# Patient Record
Sex: Male | Born: 1965 | Race: White | Hispanic: No | Marital: Married | Smoking: Never smoker
Health system: Southern US, Community
[De-identification: ages and names within clinical notes are randomized; demographics above are authoritative.]

## PROBLEM LIST (undated history)

## (undated) DIAGNOSIS — M1990 Unspecified osteoarthritis, unspecified site: Secondary | ICD-10-CM

## (undated) DIAGNOSIS — E785 Hyperlipidemia, unspecified: Secondary | ICD-10-CM

## (undated) DIAGNOSIS — G4730 Sleep apnea, unspecified: Secondary | ICD-10-CM

## (undated) DIAGNOSIS — T7840XA Allergy, unspecified, initial encounter: Secondary | ICD-10-CM

## (undated) DIAGNOSIS — F419 Anxiety disorder, unspecified: Secondary | ICD-10-CM

## (undated) HISTORY — PX: WISDOM TOOTH EXTRACTION: SHX21

## (undated) HISTORY — DX: Sleep apnea, unspecified: G47.30

## (undated) HISTORY — DX: Allergy, unspecified, initial encounter: T78.40XA

## (undated) HISTORY — PX: OPEN REDUCTION SHOULDER DISLOCATION: SUR900

## (undated) HISTORY — DX: Hyperlipidemia, unspecified: E78.5

## (undated) HISTORY — DX: Anxiety disorder, unspecified: F41.9

## (undated) HISTORY — DX: Unspecified osteoarthritis, unspecified site: M19.90

---

## 2012-05-26 ENCOUNTER — Institutional Professional Consult (permissible substitution): Payer: Self-pay | Admitting: Pulmonary Disease

## 2013-03-27 ENCOUNTER — Ambulatory Visit
Admission: RE | Admit: 2013-03-27 | Discharge: 2013-03-27 | Disposition: A | Discharge status: Home / Self Care | Payer: BC Managed Care – PPO | Source: Ambulatory Visit | Attending: Unknown Physician Specialty | Admitting: Unknown Physician Specialty

## 2013-03-27 ENCOUNTER — Other Ambulatory Visit: Payer: Self-pay | Admitting: Unknown Physician Specialty

## 2013-03-27 DIAGNOSIS — M545 Low back pain, unspecified: Secondary | ICD-10-CM

## 2014-10-08 LAB — BASIC METABOLIC PANEL
BUN: 19 (ref 4–21)
Creatinine: 1 (ref <=1.3)
GLUCOSE: 98
SODIUM: 137 (ref 137–147)

## 2014-10-08 LAB — HEPATIC FUNCTION PANEL: BILIRUBIN, TOTAL: 0.6

## 2015-03-16 IMAGING — CR DG LUMBAR SPINE COMPLETE 4+V
5 series · 5 of 5 positions shown · non-contrast
Comparison: None.

CLINICAL DATA: Lower back pain

LUMBAR SPINE - COMPLETE 4+ VIEW

[view not recorded (1 of 5)]
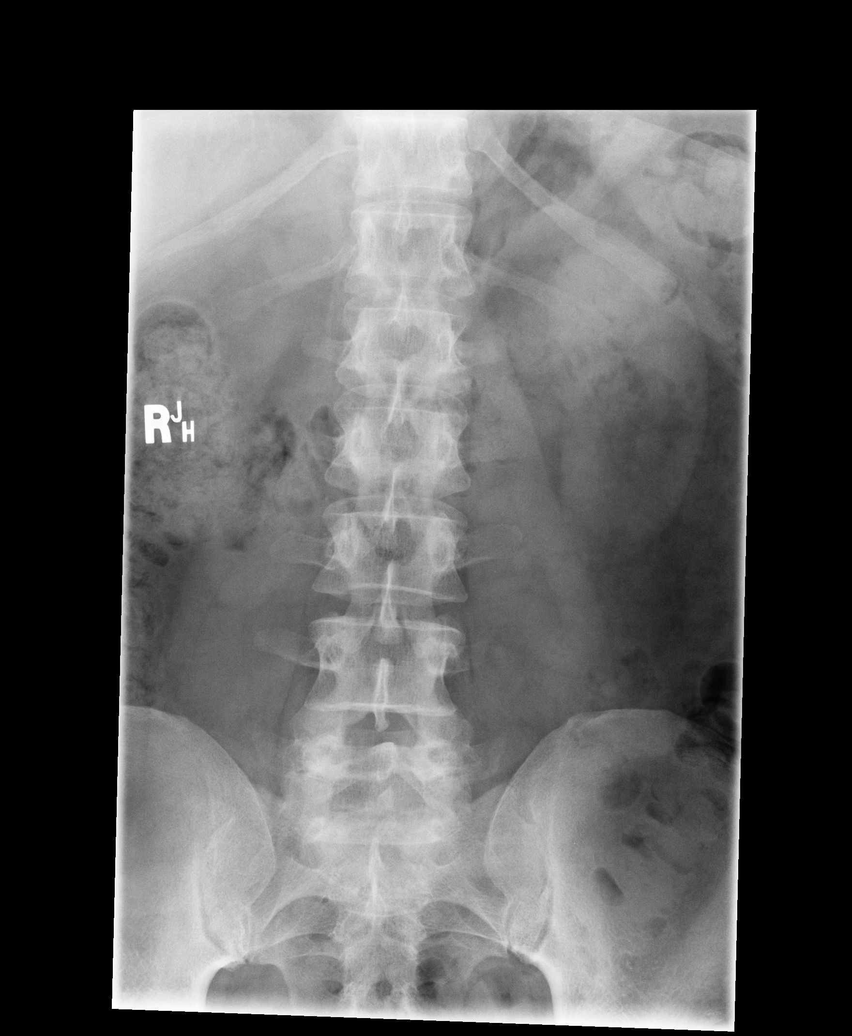

[view not recorded (2 of 5)]
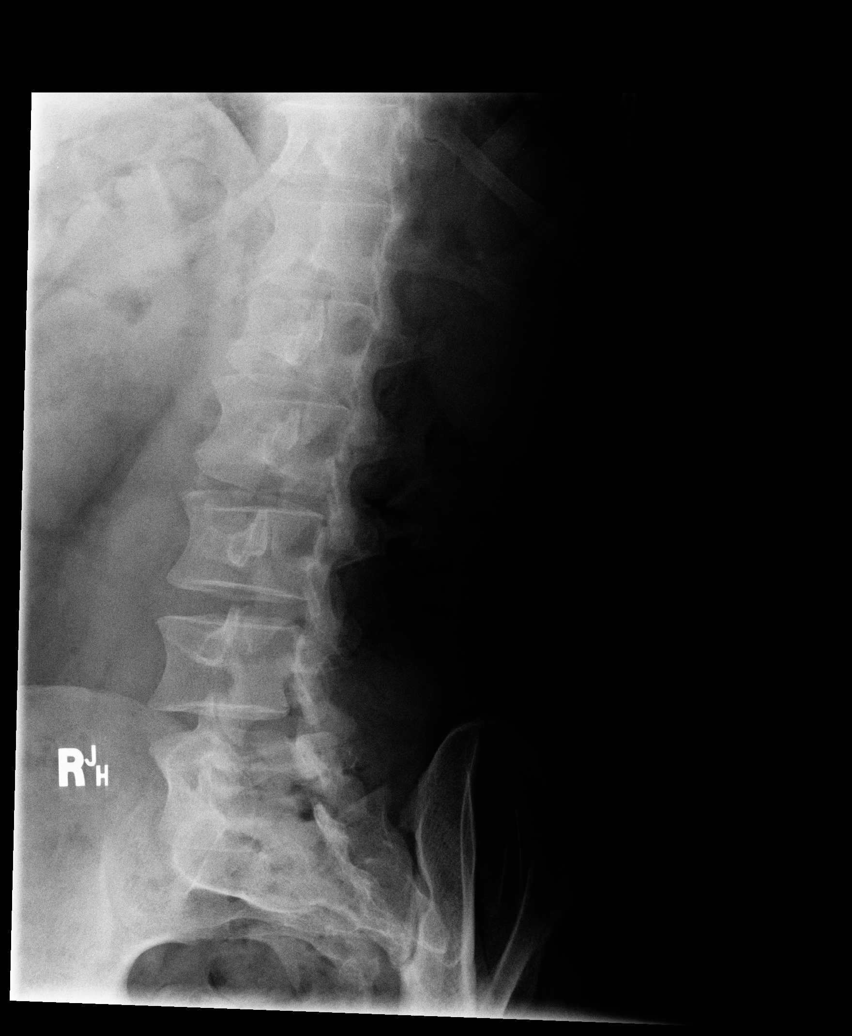

[view not recorded (3 of 5)]
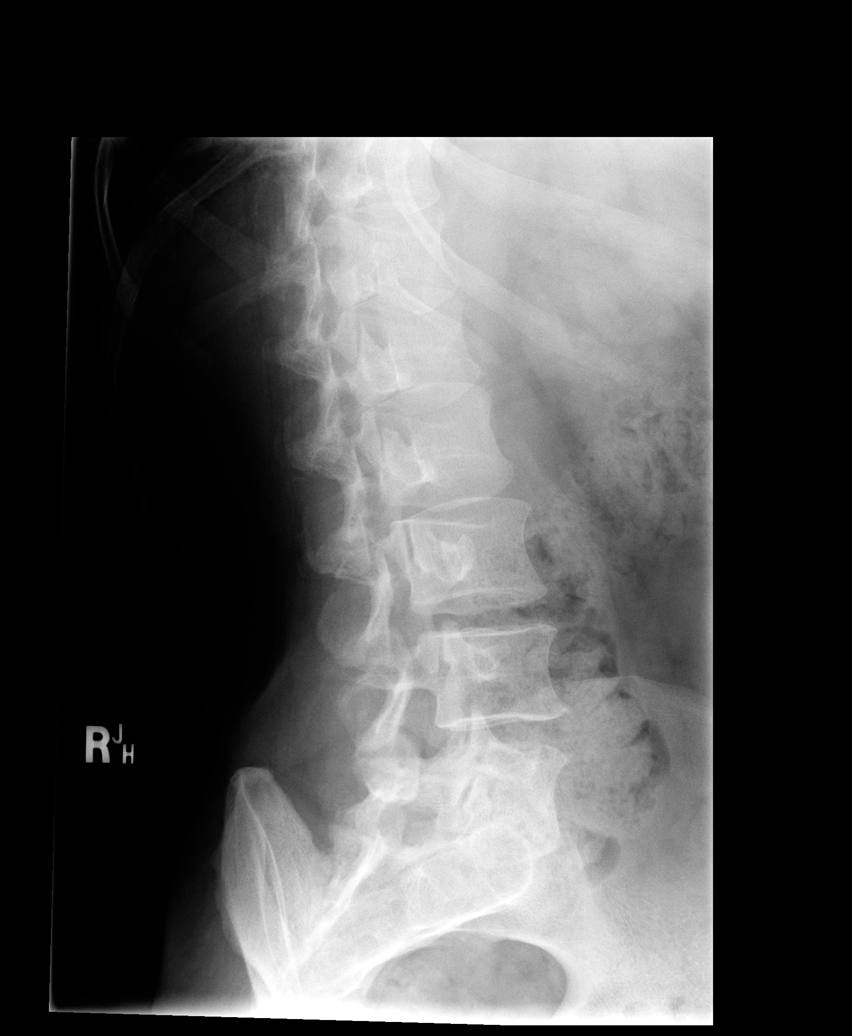

[view not recorded (4 of 5)]
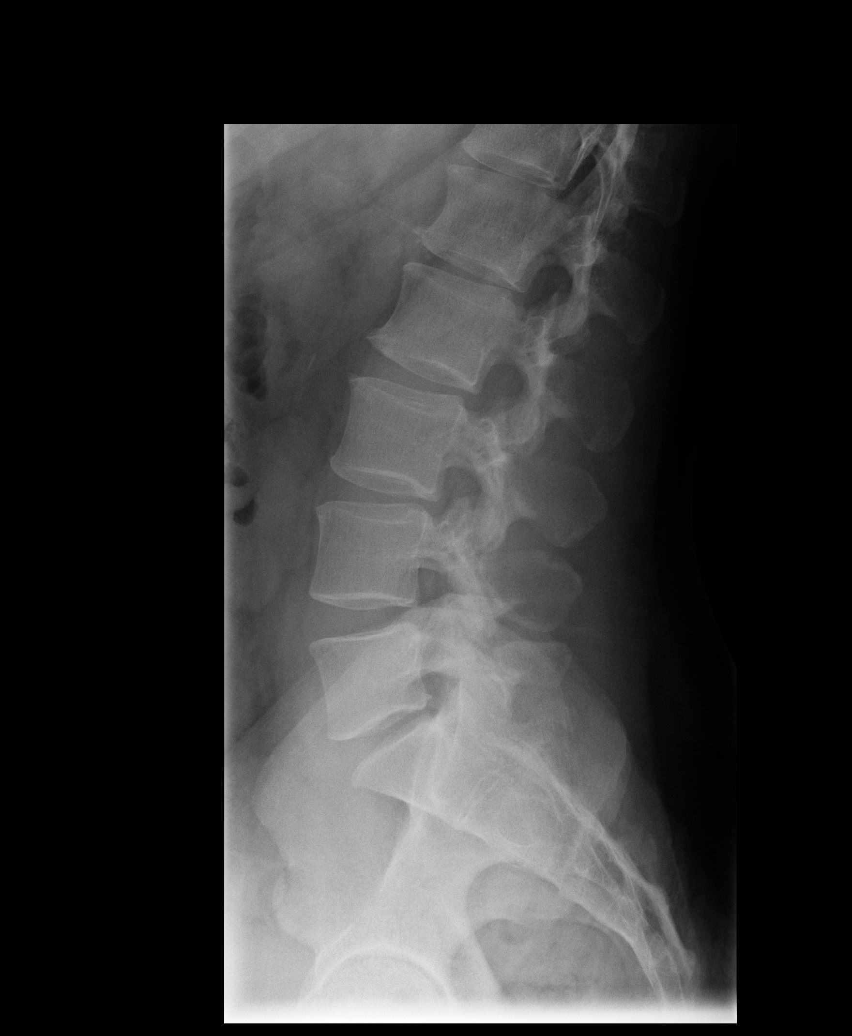

[view not recorded (5 of 5)]
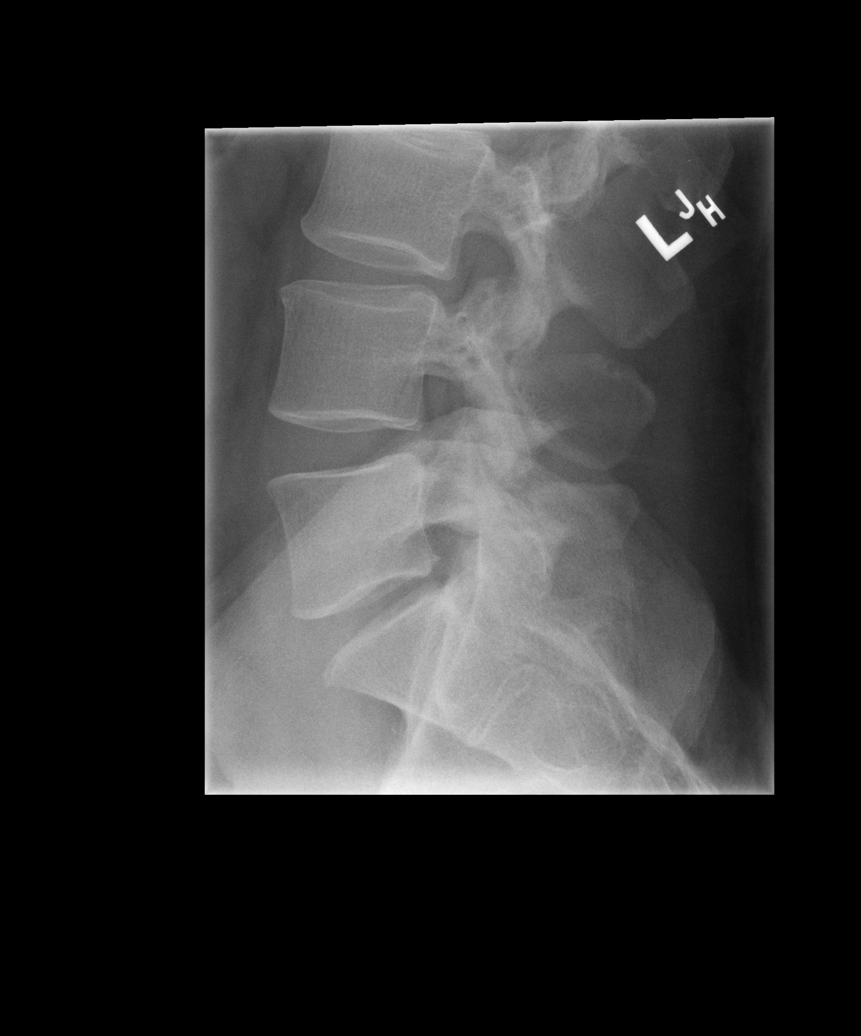

[5 of 5 positions shown; findings below may reference images not displayed]

FINDINGS: No fracture or spondylolisthesis is noted.  No
significant degenerative changes are noted.  Disc spaces are well
maintained.  Posterior facet joints appear normal.
IMPRESSION: Normal lumbar spine.

## 2016-02-02 DIAGNOSIS — G4733 Obstructive sleep apnea (adult) (pediatric): Secondary | ICD-10-CM | POA: Diagnosis not present

## 2016-05-04 DIAGNOSIS — G4733 Obstructive sleep apnea (adult) (pediatric): Secondary | ICD-10-CM | POA: Diagnosis not present

## 2016-05-08 DIAGNOSIS — N451 Epididymitis: Secondary | ICD-10-CM | POA: Diagnosis not present

## 2016-05-23 DIAGNOSIS — M545 Low back pain: Secondary | ICD-10-CM | POA: Diagnosis not present

## 2016-06-08 DIAGNOSIS — Z6828 Body mass index (BMI) 28.0-28.9, adult: Secondary | ICD-10-CM | POA: Diagnosis not present

## 2016-06-08 DIAGNOSIS — Z0000 Encounter for general adult medical examination without abnormal findings: Secondary | ICD-10-CM | POA: Diagnosis not present

## 2016-06-08 DIAGNOSIS — Z23 Encounter for immunization: Secondary | ICD-10-CM | POA: Diagnosis not present

## 2016-06-08 LAB — BASIC METABOLIC PANEL
BUN: 17 (ref 4–21)
CREATININE: 1.1 (ref 0.6–1.3)
Glucose: 105
Sodium: 140 (ref 137–147)

## 2016-06-08 LAB — HEPATIC FUNCTION PANEL: Bilirubin, Total: 0.7

## 2016-06-08 LAB — PSA: PSA: 1.9

## 2016-08-09 DIAGNOSIS — G4733 Obstructive sleep apnea (adult) (pediatric): Secondary | ICD-10-CM | POA: Diagnosis not present

## 2016-10-22 DIAGNOSIS — H1045 Other chronic allergic conjunctivitis: Secondary | ICD-10-CM | POA: Diagnosis not present

## 2016-10-22 DIAGNOSIS — J309 Allergic rhinitis, unspecified: Secondary | ICD-10-CM | POA: Diagnosis not present

## 2016-11-20 DIAGNOSIS — F331 Major depressive disorder, recurrent, moderate: Secondary | ICD-10-CM | POA: Diagnosis not present

## 2016-12-04 DIAGNOSIS — L700 Acne vulgaris: Secondary | ICD-10-CM | POA: Diagnosis not present

## 2016-12-10 DIAGNOSIS — G4733 Obstructive sleep apnea (adult) (pediatric): Secondary | ICD-10-CM | POA: Diagnosis not present

## 2016-12-12 DIAGNOSIS — F339 Major depressive disorder, recurrent, unspecified: Secondary | ICD-10-CM | POA: Diagnosis not present

## 2016-12-12 DIAGNOSIS — F4010 Social phobia, unspecified: Secondary | ICD-10-CM | POA: Diagnosis not present

## 2016-12-12 DIAGNOSIS — F410 Panic disorder [episodic paroxysmal anxiety] without agoraphobia: Secondary | ICD-10-CM | POA: Diagnosis not present

## 2016-12-12 DIAGNOSIS — F411 Generalized anxiety disorder: Secondary | ICD-10-CM | POA: Diagnosis not present

## 2017-01-03 DIAGNOSIS — F339 Major depressive disorder, recurrent, unspecified: Secondary | ICD-10-CM | POA: Diagnosis not present

## 2017-01-03 DIAGNOSIS — F4010 Social phobia, unspecified: Secondary | ICD-10-CM | POA: Diagnosis not present

## 2017-01-03 DIAGNOSIS — F411 Generalized anxiety disorder: Secondary | ICD-10-CM | POA: Diagnosis not present

## 2017-01-03 DIAGNOSIS — F410 Panic disorder [episodic paroxysmal anxiety] without agoraphobia: Secondary | ICD-10-CM | POA: Diagnosis not present

## 2017-01-08 DIAGNOSIS — F411 Generalized anxiety disorder: Secondary | ICD-10-CM | POA: Diagnosis not present

## 2017-01-08 DIAGNOSIS — F331 Major depressive disorder, recurrent, moderate: Secondary | ICD-10-CM | POA: Diagnosis not present

## 2017-01-08 DIAGNOSIS — Z79899 Other long term (current) drug therapy: Secondary | ICD-10-CM | POA: Diagnosis not present

## 2017-01-23 DIAGNOSIS — F410 Panic disorder [episodic paroxysmal anxiety] without agoraphobia: Secondary | ICD-10-CM | POA: Diagnosis not present

## 2017-01-23 DIAGNOSIS — F411 Generalized anxiety disorder: Secondary | ICD-10-CM | POA: Diagnosis not present

## 2017-01-23 DIAGNOSIS — F339 Major depressive disorder, recurrent, unspecified: Secondary | ICD-10-CM | POA: Diagnosis not present

## 2017-01-23 DIAGNOSIS — F4010 Social phobia, unspecified: Secondary | ICD-10-CM | POA: Diagnosis not present

## 2017-02-01 DIAGNOSIS — J22 Unspecified acute lower respiratory infection: Secondary | ICD-10-CM | POA: Diagnosis not present

## 2017-02-01 DIAGNOSIS — H1030 Unspecified acute conjunctivitis, unspecified eye: Secondary | ICD-10-CM | POA: Diagnosis not present

## 2017-02-01 DIAGNOSIS — R509 Fever, unspecified: Secondary | ICD-10-CM | POA: Diagnosis not present

## 2017-02-01 LAB — CBC AND DIFFERENTIAL
Neutrophils Absolute: 6
WBC: 7.7

## 2017-02-06 DIAGNOSIS — F339 Major depressive disorder, recurrent, unspecified: Secondary | ICD-10-CM | POA: Diagnosis not present

## 2017-02-06 DIAGNOSIS — F4010 Social phobia, unspecified: Secondary | ICD-10-CM | POA: Diagnosis not present

## 2017-02-06 DIAGNOSIS — F410 Panic disorder [episodic paroxysmal anxiety] without agoraphobia: Secondary | ICD-10-CM | POA: Diagnosis not present

## 2017-02-06 DIAGNOSIS — F411 Generalized anxiety disorder: Secondary | ICD-10-CM | POA: Diagnosis not present

## 2017-02-19 DIAGNOSIS — L700 Acne vulgaris: Secondary | ICD-10-CM | POA: Diagnosis not present

## 2017-03-06 DIAGNOSIS — F411 Generalized anxiety disorder: Secondary | ICD-10-CM | POA: Diagnosis not present

## 2017-03-06 DIAGNOSIS — F339 Major depressive disorder, recurrent, unspecified: Secondary | ICD-10-CM | POA: Diagnosis not present

## 2017-03-06 DIAGNOSIS — F4010 Social phobia, unspecified: Secondary | ICD-10-CM | POA: Diagnosis not present

## 2017-03-06 DIAGNOSIS — F410 Panic disorder [episodic paroxysmal anxiety] without agoraphobia: Secondary | ICD-10-CM | POA: Diagnosis not present

## 2017-03-13 DIAGNOSIS — F339 Major depressive disorder, recurrent, unspecified: Secondary | ICD-10-CM | POA: Diagnosis not present

## 2017-03-13 DIAGNOSIS — F410 Panic disorder [episodic paroxysmal anxiety] without agoraphobia: Secondary | ICD-10-CM | POA: Diagnosis not present

## 2017-03-13 DIAGNOSIS — F4010 Social phobia, unspecified: Secondary | ICD-10-CM | POA: Diagnosis not present

## 2017-03-13 DIAGNOSIS — F411 Generalized anxiety disorder: Secondary | ICD-10-CM | POA: Diagnosis not present

## 2017-03-15 DIAGNOSIS — F411 Generalized anxiety disorder: Secondary | ICD-10-CM | POA: Diagnosis not present

## 2017-03-15 DIAGNOSIS — F331 Major depressive disorder, recurrent, moderate: Secondary | ICD-10-CM | POA: Diagnosis not present

## 2017-03-15 DIAGNOSIS — Z79899 Other long term (current) drug therapy: Secondary | ICD-10-CM | POA: Diagnosis not present

## 2017-04-03 DIAGNOSIS — F339 Major depressive disorder, recurrent, unspecified: Secondary | ICD-10-CM | POA: Diagnosis not present

## 2017-04-03 DIAGNOSIS — F410 Panic disorder [episodic paroxysmal anxiety] without agoraphobia: Secondary | ICD-10-CM | POA: Diagnosis not present

## 2017-04-03 DIAGNOSIS — F4010 Social phobia, unspecified: Secondary | ICD-10-CM | POA: Diagnosis not present

## 2017-04-03 DIAGNOSIS — F411 Generalized anxiety disorder: Secondary | ICD-10-CM | POA: Diagnosis not present

## 2017-04-17 DIAGNOSIS — F411 Generalized anxiety disorder: Secondary | ICD-10-CM | POA: Diagnosis not present

## 2017-04-17 DIAGNOSIS — F339 Major depressive disorder, recurrent, unspecified: Secondary | ICD-10-CM | POA: Diagnosis not present

## 2017-04-17 DIAGNOSIS — F410 Panic disorder [episodic paroxysmal anxiety] without agoraphobia: Secondary | ICD-10-CM | POA: Diagnosis not present

## 2017-04-17 DIAGNOSIS — F4010 Social phobia, unspecified: Secondary | ICD-10-CM | POA: Diagnosis not present

## 2017-04-24 DIAGNOSIS — F4010 Social phobia, unspecified: Secondary | ICD-10-CM | POA: Diagnosis not present

## 2017-04-24 DIAGNOSIS — F411 Generalized anxiety disorder: Secondary | ICD-10-CM | POA: Diagnosis not present

## 2017-04-24 DIAGNOSIS — F410 Panic disorder [episodic paroxysmal anxiety] without agoraphobia: Secondary | ICD-10-CM | POA: Diagnosis not present

## 2017-04-24 DIAGNOSIS — F339 Major depressive disorder, recurrent, unspecified: Secondary | ICD-10-CM | POA: Diagnosis not present

## 2017-05-08 DIAGNOSIS — F339 Major depressive disorder, recurrent, unspecified: Secondary | ICD-10-CM | POA: Diagnosis not present

## 2017-05-08 DIAGNOSIS — F4010 Social phobia, unspecified: Secondary | ICD-10-CM | POA: Diagnosis not present

## 2017-05-08 DIAGNOSIS — F410 Panic disorder [episodic paroxysmal anxiety] without agoraphobia: Secondary | ICD-10-CM | POA: Diagnosis not present

## 2017-05-08 DIAGNOSIS — F411 Generalized anxiety disorder: Secondary | ICD-10-CM | POA: Diagnosis not present

## 2017-05-16 DIAGNOSIS — M79674 Pain in right toe(s): Secondary | ICD-10-CM | POA: Diagnosis not present

## 2017-05-22 DIAGNOSIS — F411 Generalized anxiety disorder: Secondary | ICD-10-CM | POA: Diagnosis not present

## 2017-05-22 DIAGNOSIS — F4010 Social phobia, unspecified: Secondary | ICD-10-CM | POA: Diagnosis not present

## 2017-05-22 DIAGNOSIS — F410 Panic disorder [episodic paroxysmal anxiety] without agoraphobia: Secondary | ICD-10-CM | POA: Diagnosis not present

## 2017-05-22 DIAGNOSIS — F339 Major depressive disorder, recurrent, unspecified: Secondary | ICD-10-CM | POA: Diagnosis not present

## 2017-05-29 DIAGNOSIS — F411 Generalized anxiety disorder: Secondary | ICD-10-CM | POA: Diagnosis not present

## 2017-05-29 DIAGNOSIS — F339 Major depressive disorder, recurrent, unspecified: Secondary | ICD-10-CM | POA: Diagnosis not present

## 2017-05-29 DIAGNOSIS — F410 Panic disorder [episodic paroxysmal anxiety] without agoraphobia: Secondary | ICD-10-CM | POA: Diagnosis not present

## 2017-05-29 DIAGNOSIS — F4010 Social phobia, unspecified: Secondary | ICD-10-CM | POA: Diagnosis not present

## 2017-06-11 DIAGNOSIS — G4733 Obstructive sleep apnea (adult) (pediatric): Secondary | ICD-10-CM | POA: Diagnosis not present

## 2017-06-12 DIAGNOSIS — F339 Major depressive disorder, recurrent, unspecified: Secondary | ICD-10-CM | POA: Diagnosis not present

## 2017-06-12 DIAGNOSIS — F4010 Social phobia, unspecified: Secondary | ICD-10-CM | POA: Diagnosis not present

## 2017-06-12 DIAGNOSIS — F411 Generalized anxiety disorder: Secondary | ICD-10-CM | POA: Diagnosis not present

## 2017-06-12 DIAGNOSIS — F410 Panic disorder [episodic paroxysmal anxiety] without agoraphobia: Secondary | ICD-10-CM | POA: Diagnosis not present

## 2017-06-26 DIAGNOSIS — F410 Panic disorder [episodic paroxysmal anxiety] without agoraphobia: Secondary | ICD-10-CM | POA: Diagnosis not present

## 2017-06-26 DIAGNOSIS — F339 Major depressive disorder, recurrent, unspecified: Secondary | ICD-10-CM | POA: Diagnosis not present

## 2017-06-26 DIAGNOSIS — F4010 Social phobia, unspecified: Secondary | ICD-10-CM | POA: Diagnosis not present

## 2017-06-26 DIAGNOSIS — F411 Generalized anxiety disorder: Secondary | ICD-10-CM | POA: Diagnosis not present

## 2017-07-03 DIAGNOSIS — F4010 Social phobia, unspecified: Secondary | ICD-10-CM | POA: Diagnosis not present

## 2017-07-03 DIAGNOSIS — F410 Panic disorder [episodic paroxysmal anxiety] without agoraphobia: Secondary | ICD-10-CM | POA: Diagnosis not present

## 2017-07-03 DIAGNOSIS — F411 Generalized anxiety disorder: Secondary | ICD-10-CM | POA: Diagnosis not present

## 2017-07-03 DIAGNOSIS — F339 Major depressive disorder, recurrent, unspecified: Secondary | ICD-10-CM | POA: Diagnosis not present

## 2017-07-10 DIAGNOSIS — F410 Panic disorder [episodic paroxysmal anxiety] without agoraphobia: Secondary | ICD-10-CM | POA: Diagnosis not present

## 2017-07-10 DIAGNOSIS — F411 Generalized anxiety disorder: Secondary | ICD-10-CM | POA: Diagnosis not present

## 2017-07-10 DIAGNOSIS — F4010 Social phobia, unspecified: Secondary | ICD-10-CM | POA: Diagnosis not present

## 2017-07-10 DIAGNOSIS — F339 Major depressive disorder, recurrent, unspecified: Secondary | ICD-10-CM | POA: Diagnosis not present

## 2017-07-17 DIAGNOSIS — F339 Major depressive disorder, recurrent, unspecified: Secondary | ICD-10-CM | POA: Diagnosis not present

## 2017-07-17 DIAGNOSIS — F411 Generalized anxiety disorder: Secondary | ICD-10-CM | POA: Diagnosis not present

## 2017-07-17 DIAGNOSIS — F4010 Social phobia, unspecified: Secondary | ICD-10-CM | POA: Diagnosis not present

## 2017-07-17 DIAGNOSIS — F410 Panic disorder [episodic paroxysmal anxiety] without agoraphobia: Secondary | ICD-10-CM | POA: Diagnosis not present

## 2017-07-18 ENCOUNTER — Encounter: Payer: Self-pay | Admitting: Family Medicine

## 2017-07-18 ENCOUNTER — Ambulatory Visit (INDEPENDENT_AMBULATORY_CARE_PROVIDER_SITE_OTHER): Payer: BLUE CROSS/BLUE SHIELD | Admitting: Family Medicine

## 2017-07-18 DIAGNOSIS — R21 Rash and other nonspecific skin eruption: Secondary | ICD-10-CM

## 2017-07-18 DIAGNOSIS — F331 Major depressive disorder, recurrent, moderate: Secondary | ICD-10-CM | POA: Diagnosis not present

## 2017-07-18 MED ORDER — TRIAMCINOLONE ACETONIDE 0.1 % EX CREA: 1.0000 "application " | TOPICAL_CREAM | Freq: Two times a day (BID) | CUTANEOUS | 0 refills | Status: DC

## 2017-07-18 NOTE — Patient Instructions (Signed)
Let me know if the triamcinolone does not work.  Come back soon for your full physical.  Take care,  Dr [name]

## 2017-07-18 NOTE — Assessment & Plan Note (Signed)
PHQ 9 score of 9 today.  Continue Lexapro.

## 2017-07-18 NOTE — Assessment & Plan Note (Signed)
Possibly consistent with eczema given his surrounding dry skin.  Start triamcinolone cream.  Topical fungal infection is also possible.  If no improvement, would consider empiric topical antifungal.

## 2017-07-18 NOTE — Progress Notes (Signed)
Subjective:  Ethan Osborn is a 51 y.o. male who presents today with a chief complaint of left leg rash and to establish care.   HPI:  Left leg rash, new issue Symptoms started about 6 weeks ago.  Stable over that time.  Thought that it was due to dry skin and eczema.  Tried putting hydrocortisone cream on which helped a little bit, however the rash has persisted.  No obvious precipitating events.  Is never had any sort of eczema or other similar rashes in the past.  No other obvious alleviating or aggravating factors.  No fevers or chills.  No pain or drainage.  Depression/Anxiety, new problem Current Medications: Lexapro 20 mg daily, tolerating without side effects Additional history: Patient was started on Lexapro a couple of months ago.  Thinks it is working well.  Depression screen PHQ 2/9 07/18/2017  Decreased Interest 2  Down, Depressed, Hopeless 1  PHQ - 2 Score 3  Altered sleeping 1  Tired, decreased energy 3  Change in appetite 0  Feeling bad or failure about yourself  1  Trouble concentrating 1  Moving slowly or fidgety/restless 0  Suicidal thoughts 0  PHQ-9 Score 9   ROS: No SI or HI.    ROS: Per HPI, otherwise a 14 point review of systems was performed and was negative  PMH:  The following were reviewed and entered/updated in epic: History reviewed. No pertinent past medical history. Patient Active Problem List   Diagnosis Date Noted  . Rash 07/18/2017  . Moderate recurrent major depression ([place]) 01/08/2017  . GAD (generalized anxiety disorder) 01/08/2017   History reviewed. No pertinent surgical history.  Family History  Problem Relation Age of Onset  . Depression Mother   . Hearing loss Mother   . Arthritis Father   . Diabetes Father   . Hearing loss Father   . Heart disease Father   . Hyperlipidemia Father   . Kidney disease Father   . Depression Sister   . Stroke Maternal Grandfather   . Cancer Paternal Grandmother   . Cancer Paternal  Grandfather     Medications- reviewed and updated Current Outpatient Medications  Medication Sig Dispense Refill  . escitalopram (LEXAPRO) 20 MG tablet     . triamcinolone cream (KENALOG) 0.1 % Apply 1 application topically 2 (two) times daily. 30 g 0   No current facility-administered medications for this visit.    Allergies-reviewed and updated Allergies  Allergen Reactions  . Penicillins Hives    Shortness of breath    Social History   Socioeconomic History  . Marital status: Married    Spouse name: None  . Number of children: None  . Years of education: None  . Highest education level: None  Social Needs  . Financial resource strain: None  . Food insecurity - worry: None  . Food insecurity - inability: None  . Transportation needs - medical: None  . Transportation needs - non-medical: None  Occupational History  . Occupation: Armed forces technical officer   Tobacco Use  . Smoking status: Never Smoker  . Smokeless tobacco: Never Used  Substance and Sexual Activity  . Alcohol use: Yes  . Drug use: No  . Sexual activity: Yes  Other Topics Concern  . None  Social History Narrative  . None   Objective:  Physical Exam: BP 125/85 (BP Location: Right Arm, Cuff Size: Normal)   Pulse (!) 56   Ht 5' 10.5" (1.791 m)   Wt 222 lb (100.7 kg)  SpO2 97%   BMI 31.40 kg/m   Gen: NAD, resting comfortably CV: RRR with no murmurs appreciated Pulm: NWOB, CTAB with no crackles, wheezes, or rhonchi GI: Normal bowel sounds present. Soft, Nontender, Nondistended. MSK: No edema, cyanosis, or clubbing noted Skin: Small, discrete, erythematous papules in a cluster noted on left lateral lower extremity.  Surrounding dry skin. Neuro: Grossly normal, moves all extremities Psych: Normal affect and thought content  Assessment/Plan:  Rash Possibly consistent with eczema given his surrounding dry skin.  Start triamcinolone cream.  Topical fungal infection is also possible.  If no  improvement, would consider empiric topical antifungal.  Moderate recurrent major depression (HCC) PHQ 9 score of 9 today.  Continue Lexapro.  Preventive healthcare Up-to-date on flu shot.  He will return soon for his CPE.  Algis Greenhouse. [name], MD 07/18/2017 10:20 AM

## 2017-07-24 DIAGNOSIS — F410 Panic disorder [episodic paroxysmal anxiety] without agoraphobia: Secondary | ICD-10-CM | POA: Diagnosis not present

## 2017-07-24 DIAGNOSIS — F4010 Social phobia, unspecified: Secondary | ICD-10-CM | POA: Diagnosis not present

## 2017-07-24 DIAGNOSIS — F339 Major depressive disorder, recurrent, unspecified: Secondary | ICD-10-CM | POA: Diagnosis not present

## 2017-07-24 DIAGNOSIS — F411 Generalized anxiety disorder: Secondary | ICD-10-CM | POA: Diagnosis not present

## 2017-07-26 ENCOUNTER — Telehealth: Payer: Self-pay | Admitting: Family Medicine

## 2017-07-26 MED ORDER — ESCITALOPRAM OXALATE 20 MG PO TABS: 20.0000 mg | ORAL_TABLET | Freq: Every day | ORAL | 1 refills | Status: DC

## 2017-07-26 NOTE — Telephone Encounter (Signed)
Copied from [place]. Topic: Quick Communication - Rx Refill/Question >> Jul 26, 2017 10:27 AM Neva Seat wrote:   Preferred Pharmacy (with phone number or street name): [facility] / [address] [phone] -wants all future Rx's be filled at this location.  Lexapro Oh hand - 2 left

## 2017-07-30 DIAGNOSIS — F4010 Social phobia, unspecified: Secondary | ICD-10-CM | POA: Diagnosis not present

## 2017-07-30 DIAGNOSIS — F339 Major depressive disorder, recurrent, unspecified: Secondary | ICD-10-CM | POA: Diagnosis not present

## 2017-07-30 DIAGNOSIS — F411 Generalized anxiety disorder: Secondary | ICD-10-CM | POA: Diagnosis not present

## 2017-07-30 DIAGNOSIS — F410 Panic disorder [episodic paroxysmal anxiety] without agoraphobia: Secondary | ICD-10-CM | POA: Diagnosis not present

## 2017-08-01 ENCOUNTER — Encounter: Payer: Self-pay | Admitting: Family Medicine

## 2017-08-01 ENCOUNTER — Ambulatory Visit (INDEPENDENT_AMBULATORY_CARE_PROVIDER_SITE_OTHER): Payer: BLUE CROSS/BLUE SHIELD | Admitting: Family Medicine

## 2017-08-01 VITALS — BP 126/84 | HR 51 | Temp 98.4°F | Ht 70.5 in | Wt 223.8 lb

## 2017-08-01 DIAGNOSIS — Z13220 Encounter for screening for lipoid disorders: Secondary | ICD-10-CM

## 2017-08-01 DIAGNOSIS — Z0001 Encounter for general adult medical examination with abnormal findings: Secondary | ICD-10-CM | POA: Diagnosis not present

## 2017-08-01 DIAGNOSIS — Z114 Encounter for screening for human immunodeficiency virus [HIV]: Secondary | ICD-10-CM | POA: Diagnosis not present

## 2017-08-01 LAB — LDL CHOLESTEROL, DIRECT: LDL DIRECT: 129 mg/dL

## 2017-08-01 LAB — COMPREHENSIVE METABOLIC PANEL
ALBUMIN: 4.4 g/dL (ref 3.5–5.2)
ALK PHOS: 58 U/L (ref 39–117)
ALT: 39 U/L (ref 0–53)
AST: 30 U/L (ref 0–37)
BUN: 21 mg/dL (ref 6–23)
CO2: 26 mEq/L (ref 19–32)
CREATININE: 1.01 mg/dL (ref 0.40–1.50)
Calcium: 9.3 mg/dL (ref 8.4–10.5)
Chloride: 103 mEq/L (ref 96–112)
GFR: 83.17 mL/min (ref >60.00)
GLUCOSE: 108 mg/dL — AB (ref 70–99)
POTASSIUM: 4.2 meq/L (ref 3.5–5.1)
SODIUM: 136 meq/L (ref 135–145)
TOTAL PROTEIN: 6.8 g/dL (ref 6.0–8.3)
Total Bilirubin: 0.8 mg/dL (ref 0.2–1.2)

## 2017-08-01 LAB — CBC
HCT: 46.3 % (ref 39.0–52.0)
Hemoglobin: 15.7 g/dL (ref 13.0–17.0)
MCHC: 34 g/dL (ref 30.0–36.0)
MCV: 92.4 fl (ref 78.0–100.0)
Platelets: 203 10*3/uL (ref 150.0–400.0)
RBC: 5.01 Mil/uL (ref 4.22–5.81)
RDW: 12.9 % (ref 11.5–15.5)
WBC: 4.6 10*3/uL (ref 4.0–10.5)

## 2017-08-01 LAB — LIPID PANEL
CHOL/HDL RATIO: 6
CHOLESTEROL: 209 mg/dL — AB (ref 0–200)
HDL: 35.8 mg/dL — ABNORMAL LOW (ref >39.00)
NonHDL: 173.63
Triglycerides: 283 mg/dL — ABNORMAL HIGH (ref 0.0–149.0)
VLDL: 56.6 mg/dL — AB (ref 0.0–40.0)

## 2017-08-01 NOTE — Patient Instructions (Signed)

## 2017-08-01 NOTE — Progress Notes (Signed)
Subjective:  Ethan Osborn is a 51 y.o. male who presents today for his annual comprehensive physical exam.    HPI:  Hamlin Devine has no acute complaints today.   He has been under a bit more stress at work lately. Thinks he is handling it ok. Stress relievers include walking the dog and working out at the gym.   Lifestyle Diet: Some room for improvement. No specific diet.  Exercise: Gym 4-5 days per week. Similar to cross fit. Walking the dog.    Depression screen PHQ 2/9 07/18/2017  Decreased Interest 2  Down, Depressed, Hopeless 1  PHQ - 2 Score 3  Altered sleeping 1  Tired, decreased energy 3  Change in appetite 0  Feeling bad or failure about yourself  1  Trouble concentrating 1  Moving slowly or fidgety/restless 0  Suicidal thoughts 0  PHQ-9 Score 9    Health Maintenance Due  Topic Date Due  . HIV Screening  10/03/1980     ROS: Per HPI, otherwise a 14 point review of systems was performed and was negative  PMH:  The following were reviewed and entered/updated in epic: History reviewed. No pertinent past medical history. Patient Active Problem List   Diagnosis Date Noted  . Rash 07/18/2017  . Moderate recurrent major depression ([place]) 01/08/2017  . GAD (generalized anxiety disorder) 01/08/2017   History reviewed. No pertinent surgical history.  Family History  Problem Relation Age of Onset  . Depression Mother   . Hearing loss Mother   . Arthritis Father   . Diabetes Father   . Hearing loss Father   . Heart disease Father   . Hyperlipidemia Father   . Kidney disease Father   . Depression Sister   . Stroke Maternal Grandfather   . Cancer Paternal Grandmother   . Cancer Paternal Grandfather     Medications- reviewed and updated Current Outpatient Medications  Medication Sig Dispense Refill  . escitalopram (LEXAPRO) 20 MG tablet Take 1 tablet (20 mg total) by mouth daily. 30 tablet 1  . triamcinolone cream (KENALOG) 0.1 % Apply 1 application  topically 2 (two) times daily. 30 g 0   No current facility-administered medications for this visit.     Allergies-reviewed and updated Allergies  Allergen Reactions  . Penicillins Hives    Shortness of breath    Social History   Socioeconomic History  . Marital status: Married    Spouse name: None  . Number of children: None  . Years of education: None  . Highest education level: None  Social Needs  . Financial resource strain: None  . Food insecurity - worry: None  . Food insecurity - inability: None  . Transportation needs - medical: None  . Transportation needs - non-medical: None  Occupational History  . Occupation: Armed forces technical officer   Tobacco Use  . Smoking status: Never Smoker  . Smokeless tobacco: Never Used  Substance and Sexual Activity  . Alcohol use: Yes  . Drug use: No  . Sexual activity: Yes  Other Topics Concern  . None  Social History Narrative  . None    Objective:  Physical Exam: BP 126/84 (BP Location: Left Arm, Patient Position: Sitting, Cuff Size: Normal)   Pulse (!) 51   Temp 98.4 F (36.9 C) (Oral)   Ht 5' 10.5" (1.791 m)   Wt 223 lb 12.8 oz (101.5 kg)   SpO2 97%   BMI 31.66 kg/m   Body mass index is 31.66 kg/m. Gen: NAD, resting  comfortably HEENT: TMs normal bilaterally. OP clear. No thyromegaly noted.  CV: RRR with no murmurs appreciated Pulm: NWOB, CTAB with no crackles, wheezes, or rhonchi GI: Normal bowel sounds present. Soft, Nontender, Nondistended. MSK: no edema, cyanosis, or clubbing noted Skin: warm, dry Neuro: CN2-12 grossly intact. Strength 5/5 in upper and lower extremities. Reflexes symmetric and intact bilaterally.  Psych: Normal affect and thought content  Assessment/Plan:  Preventative Healthcare: Check HIV antibody today. Check lipid panel  Patient Counseling:  -Nutrition: Stressed importance of moderation in sodium/caffeine intake, saturated fat and cholesterol, caloric balance, sufficient intake of  fresh fruits, vegetables, and fiber.  -Stressed the importance of regular exercise.   -Substance Abuse: Discussed cessation/primary prevention of tobacco, alcohol, or other drug use; driving or other dangerous activities under the influence; availability of treatment for abuse.   -Injury prevention: Discussed safety belts, safety helmets, smoke detector, smoking near bedding or upholstery.   -Sexuality: Discussed sexually transmitted diseases, partner selection, use of condoms, avoidance of unintended pregnancy and contraceptive alternatives.   -Dental health: Discussed importance of regular tooth brushing, flossing, and dental visits.  -Health maintenance and immunizations reviewed. Please refer to Health maintenance section.  Return to care in 1 year for next preventative visit.   Algis Greenhouse. [name], MD 08/01/2017 8:41 AM

## 2017-08-02 LAB — HIV ANTIBODY (ROUTINE TESTING W REFLEX): HIV: NONREACTIVE

## 2017-08-02 NOTE — Progress Notes (Signed)
Blood sugar and Cholesterol slightly elevated - no need to start medications, continue working on diet and exercise.  All other labs are normal.  Caleb M. [name], MD 08/02/2017 1:05 PM

## 2017-08-14 DIAGNOSIS — F339 Major depressive disorder, recurrent, unspecified: Secondary | ICD-10-CM | POA: Diagnosis not present

## 2017-08-14 DIAGNOSIS — F410 Panic disorder [episodic paroxysmal anxiety] without agoraphobia: Secondary | ICD-10-CM | POA: Diagnosis not present

## 2017-08-14 DIAGNOSIS — F411 Generalized anxiety disorder: Secondary | ICD-10-CM | POA: Diagnosis not present

## 2017-08-14 DIAGNOSIS — F4010 Social phobia, unspecified: Secondary | ICD-10-CM | POA: Diagnosis not present

## 2017-08-16 ENCOUNTER — Ambulatory Visit: Payer: BLUE CROSS/BLUE SHIELD | Admitting: Family Medicine

## 2017-08-16 ENCOUNTER — Encounter: Payer: Self-pay | Admitting: Family Medicine

## 2017-08-16 DIAGNOSIS — F411 Generalized anxiety disorder: Secondary | ICD-10-CM

## 2017-08-16 DIAGNOSIS — F331 Major depressive disorder, recurrent, moderate: Secondary | ICD-10-CM | POA: Diagnosis not present

## 2017-08-16 MED ORDER — SERTRALINE HCL 100 MG PO TABS: 200.0000 mg | ORAL_TABLET | Freq: Every day | ORAL | 3 refills | Status: DC

## 2017-08-16 NOTE — Progress Notes (Signed)
    Subjective:  Ethan Osborn is a 52 y.o. male who presents today with a chief complaint of depression and anxiety follow-up.   HPI:  Depression/Anxiety, established problem, worsening Patient on Lexapro for several months.  Recently increased dose to 20 mg daily about 3 months ago.  Initially thought that this was helping with his depression and anxiety symptoms however has noticed significant worsening over the past several weeks.  He thought this was due to the increased stress during the holiday seasons, however now that the holidays are over his symptoms have persisted.  He has had decreased energy.  Decreased appetite.  Increased difficulty concentrating and increased difficulty sleeping.  He sees a therapist weekly.  They suggested that he may try another antidepressant medication.   Depression screen PHQ 2/9 08/16/2017  Decreased Interest 3  Down, Depressed, Hopeless 2  PHQ - 2 Score 5  Altered sleeping 3  Tired, decreased energy 2  Change in appetite 1  Feeling bad or failure about yourself  2  Trouble concentrating 3  Moving slowly or fidgety/restless 1  Suicidal thoughts 0  PHQ-9 Score 17  Difficult doing work/chores Very difficult    GAD 7 : Generalized Anxiety Score 08/16/2017  Nervous, Anxious, on Edge 3  Control/stop worrying 3  Worry too much - different things 2  Trouble relaxing 3  Restless 2  Easily annoyed or irritable 3  Afraid - awful might happen 1  Total GAD 7 Score 17  Anxiety Difficulty Very difficult   ROS: No SI or HI.  Objective:  Physical Exam: BP 124/80 (BP Location: Left Arm, Patient Position: Sitting, Cuff Size: Normal)   Pulse 60   Temp 98.5 F (36.9 C) (Oral)   Ht 5' 10.5" (1.791 m)   Wt 229 lb 11.2 oz (104.2 kg)   SpO2 96%   BMI 32.49 kg/m   Gen: NAD, resting comfortably Neuro: Grossly normal, moves all extremities Psych: Normal affect and thought content  Assessment/Plan:  Moderate recurrent major depression (HCC) PHQ 9  score elevated to 17 today.  We will switch from Lexapro to Zoloft.  He will follow-up in 4 weeks.  If still no improvement, would consider switching to SNRI versus starting augmentation with atypical antipsychotic.  He will continue seeing his therapist in the interim.  Return precautions reviewed.  GAD (generalized anxiety disorder) See depression A/P.  Switch Lexapro to Zoloft.  Follow-up in 4 weeks.  Algis Greenhouse. [name], MD 08/16/2017 5:03 PM

## 2017-08-16 NOTE — Assessment & Plan Note (Signed)
PHQ 9 score elevated to 17 today.  We will switch from Lexapro to Zoloft.  He will follow-up in 4 weeks.  If still no improvement, would consider switching to SNRI versus starting augmentation with atypical antipsychotic.  He will continue seeing his therapist in the interim.  Return precautions reviewed.

## 2017-08-16 NOTE — Assessment & Plan Note (Signed)
See depression A/P.  Switch Lexapro to Zoloft.  Follow-up in 4 weeks.

## 2017-08-16 NOTE — Patient Instructions (Signed)
Stop the lexapro.  Start the sertraline.  Please come back in 4 weeks for a follow up, or sooner as needed.  There is sometimes stronger before we find the right medication for you.  We have a lot of other options of sertraline does not work for you.  Take care, Dr. [name]

## 2017-08-20 DIAGNOSIS — F4010 Social phobia, unspecified: Secondary | ICD-10-CM | POA: Diagnosis not present

## 2017-08-20 DIAGNOSIS — F411 Generalized anxiety disorder: Secondary | ICD-10-CM | POA: Diagnosis not present

## 2017-08-20 DIAGNOSIS — F339 Major depressive disorder, recurrent, unspecified: Secondary | ICD-10-CM | POA: Diagnosis not present

## 2017-08-20 DIAGNOSIS — F410 Panic disorder [episodic paroxysmal anxiety] without agoraphobia: Secondary | ICD-10-CM | POA: Diagnosis not present

## 2017-08-21 ENCOUNTER — Other Ambulatory Visit: Payer: Self-pay

## 2017-08-28 DIAGNOSIS — F4010 Social phobia, unspecified: Secondary | ICD-10-CM | POA: Diagnosis not present

## 2017-08-28 DIAGNOSIS — F339 Major depressive disorder, recurrent, unspecified: Secondary | ICD-10-CM | POA: Diagnosis not present

## 2017-08-28 DIAGNOSIS — F411 Generalized anxiety disorder: Secondary | ICD-10-CM | POA: Diagnosis not present

## 2017-08-28 DIAGNOSIS — F410 Panic disorder [episodic paroxysmal anxiety] without agoraphobia: Secondary | ICD-10-CM | POA: Diagnosis not present

## 2017-09-03 DIAGNOSIS — F410 Panic disorder [episodic paroxysmal anxiety] without agoraphobia: Secondary | ICD-10-CM | POA: Diagnosis not present

## 2017-09-03 DIAGNOSIS — F411 Generalized anxiety disorder: Secondary | ICD-10-CM | POA: Diagnosis not present

## 2017-09-03 DIAGNOSIS — F339 Major depressive disorder, recurrent, unspecified: Secondary | ICD-10-CM | POA: Diagnosis not present

## 2017-09-03 DIAGNOSIS — F4010 Social phobia, unspecified: Secondary | ICD-10-CM | POA: Diagnosis not present

## 2017-09-05 ENCOUNTER — Encounter: Payer: Self-pay | Admitting: Family Medicine

## 2017-09-05 ENCOUNTER — Ambulatory Visit: Payer: BLUE CROSS/BLUE SHIELD | Admitting: Family Medicine

## 2017-09-05 VITALS — BP 128/78 | HR 59 | Temp 98.2°F | Ht 70.5 in | Wt 226.2 lb

## 2017-09-05 DIAGNOSIS — F331 Major depressive disorder, recurrent, moderate: Secondary | ICD-10-CM

## 2017-09-05 DIAGNOSIS — F411 Generalized anxiety disorder: Secondary | ICD-10-CM | POA: Diagnosis not present

## 2017-09-05 MED ORDER — DULOXETINE HCL 30 MG PO CPEP: 30.0000 mg | ORAL_CAPSULE | Freq: Every day | ORAL | 3 refills | Status: DC

## 2017-09-05 NOTE — Assessment & Plan Note (Signed)
Patient did not do well on Zoloft.  Given that he has failed 2 SSRIs, we will start therapy with SNRI today.  Start Cymbalta 30 mg daily.  Instructed patient to increase to 60 mg daily He does well after 1 week.  He will follow-up with me in 1 month.  Has been seeing his counselor which is worked well for him.

## 2017-09-05 NOTE — Assessment & Plan Note (Signed)
See depression A/P.  Switch from Zoloft to Cymbalta.

## 2017-09-05 NOTE — Patient Instructions (Signed)
Please start the Cymbalta.  You can take 1 pill daily for a week.  If you do well at this dose, you can increase to 2 pills daily.  Stop the sertraline.  Come back to see me in about 1 month, or sooner as needed.  Please let me know if you have significant side effects or if the medication is not working well for you.  Take care, Dr. [name]

## 2017-09-05 NOTE — Progress Notes (Signed)
    Subjective:  Ethan Osborn is a 52 y.o. male who presents today with a chief complaint of depression and anxiety follow-up.   HPI:  Depression/Anxiety, established problems,, stable Patient seen about 3 weeks ago for both of these problems.  At that time we switched him from Lexapro to Zoloft as he was not doing well on Lexapro.  He has not done well with Zoloft.  Reports feeling "foggy headed."  For the past several days.  He decreased the dose to 100 mg and the symptoms persisted.  He had a little nausea with it otherwise no other side effects.  No SI or HI.  ROS: No SI or HI.  Objective:  Physical Exam: BP 128/78 (BP Location: Left Arm, Patient Position: Sitting, Cuff Size: Normal)   Pulse (!) 59   Temp 98.2 F (36.8 C) (Oral)   Ht 5' 10.5" (1.791 m)   Wt 226 lb 3.2 oz (102.6 kg)   SpO2 96%   BMI 32.00 kg/m   Gen: NAD, resting comfortably Neuro: Grossly normal, moves all extremities Psych: Normal affect and thought content  Assessment/Plan:  Moderate recurrent major depression ([place]) Patient did not do well on Zoloft.  Given that he has failed 2 SSRIs, we will start therapy with SNRI today.  Start Cymbalta 30 mg daily.  Instructed patient to increase to 60 mg daily He does well after 1 week.  He will follow-up with me in 1 month.  Has been seeing his counselor which is worked well for him.  GAD (generalized anxiety disorder) See depression A/P.  Switch from Zoloft to Cymbalta.   Ethan Osborn. [name], MD 09/05/2017 4:44 PM

## 2017-09-23 ENCOUNTER — Encounter: Payer: Self-pay | Admitting: Family Medicine

## 2017-09-24 DIAGNOSIS — F411 Generalized anxiety disorder: Secondary | ICD-10-CM | POA: Diagnosis not present

## 2017-09-24 DIAGNOSIS — F339 Major depressive disorder, recurrent, unspecified: Secondary | ICD-10-CM | POA: Diagnosis not present

## 2017-09-24 DIAGNOSIS — F4010 Social phobia, unspecified: Secondary | ICD-10-CM | POA: Diagnosis not present

## 2017-09-24 DIAGNOSIS — F410 Panic disorder [episodic paroxysmal anxiety] without agoraphobia: Secondary | ICD-10-CM | POA: Diagnosis not present

## 2017-09-25 ENCOUNTER — Telehealth: Payer: Self-pay | Admitting: Family Medicine

## 2017-09-25 ENCOUNTER — Other Ambulatory Visit: Payer: Self-pay

## 2017-09-25 MED ORDER — DULOXETINE HCL 60 MG PO CPEP: 60.0000 mg | ORAL_CAPSULE | Freq: Every day | ORAL | 1 refills | Status: DC

## 2017-09-25 NOTE — Telephone Encounter (Signed)
Patient's wife would like to know if we have received the records from the previous PCP office, [place]. Please call patient to update with a status.

## 2017-09-26 ENCOUNTER — Ambulatory Visit: Payer: BLUE CROSS/BLUE SHIELD | Admitting: Family Medicine

## 2017-09-27 NOTE — Telephone Encounter (Signed)
I have called the wife to update her that the records have been received. No further action required.

## 2017-10-01 DIAGNOSIS — H40003 Preglaucoma, unspecified, bilateral: Secondary | ICD-10-CM | POA: Diagnosis not present

## 2017-10-02 DIAGNOSIS — F339 Major depressive disorder, recurrent, unspecified: Secondary | ICD-10-CM | POA: Diagnosis not present

## 2017-10-02 DIAGNOSIS — F4010 Social phobia, unspecified: Secondary | ICD-10-CM | POA: Diagnosis not present

## 2017-10-02 DIAGNOSIS — F411 Generalized anxiety disorder: Secondary | ICD-10-CM | POA: Diagnosis not present

## 2017-10-02 DIAGNOSIS — F410 Panic disorder [episodic paroxysmal anxiety] without agoraphobia: Secondary | ICD-10-CM | POA: Diagnosis not present

## 2017-10-07 ENCOUNTER — Ambulatory Visit: Payer: BLUE CROSS/BLUE SHIELD | Admitting: Family Medicine

## 2017-10-07 ENCOUNTER — Encounter: Payer: Self-pay | Admitting: Family Medicine

## 2017-10-07 DIAGNOSIS — F331 Major depressive disorder, recurrent, moderate: Secondary | ICD-10-CM | POA: Diagnosis not present

## 2017-10-07 DIAGNOSIS — F411 Generalized anxiety disorder: Secondary | ICD-10-CM | POA: Diagnosis not present

## 2017-10-07 NOTE — Progress Notes (Signed)
    Subjective:  Ethan Osborn is a 52 y.o. male who presents today with a chief complaint of depression and anxiety.   HPI:  Depression/Anxiety, established problems, Stable Patient seen about a month ago for these problems.  At that time we switched him from Zoloft to Cymbalta.  Over the past month, he has done well.  He is now taking Cymbalta 60 mg daily.  He had a little bit of nausea for the first week which has since subsided.  No other side effects noted.  No SI or HI.  Depression screen PHQ 2/9 10/07/2017  Decreased Interest 1  Down, Depressed, Hopeless 1  PHQ - 2 Score 2  Altered sleeping 1  Tired, decreased energy 0  Change in appetite 0  Feeling bad or failure about yourself  1  Trouble concentrating 0  Moving slowly or fidgety/restless 0  Suicidal thoughts 0  PHQ-9 Score 4  Difficult doing work/chores Somewhat difficult   GAD 7 : Generalized Anxiety Score 10/07/2017  Nervous, Anxious, on Edge 1  Control/stop worrying 1  Worry too much - different things 1  Trouble relaxing 1  Restless 0  Easily annoyed or irritable 1  Afraid - awful might happen 0  Total GAD 7 Score 5  Anxiety Difficulty Somewhat difficult   ROS: No SI or HI.  Objective:  Physical Exam: BP 126/74 (BP Location: Left Arm, Patient Position: Sitting, Cuff Size: Normal)   Pulse 62   Temp 98.5 F (36.9 C) (Oral)   Ht 5' 10.5" (1.791 m)   Wt 228 lb 12.8 oz (103.8 kg)   SpO2 96%   BMI 32.37 kg/m   Gen: NAD, resting comfortably CV: RRR with no murmurs appreciated Pulm: NWOB, CTAB with no crackles, wheezes, or rhonchi  Assessment/Plan:  Moderate recurrent major depression (HCC) PHQ 9 score significantly improved.  Continue Cymbalta 60 mg daily.  Continue behavioral therapy.  Follow-up with me in 6 months.  GAD (generalized anxiety disorder) See depression A/P.  GAD score improved to 5 today.  Continue Cymbalta 60 mg daily.  Follow-up with me in 6 months.  Algis Greenhouse. [name], MD 10/07/2017 4:43  PM

## 2017-10-07 NOTE — Patient Instructions (Signed)
I am glad you are doing well!  We will not make any changes today.  Please come back to see me in about 6 months.  I would like to see you back in December for your physical.  Take care, Dr [name]

## 2017-10-07 NOTE — Assessment & Plan Note (Signed)
PHQ 9 score significantly improved.  Continue Cymbalta 60 mg daily.  Continue behavioral therapy.  Follow-up with me in 6 months.

## 2017-10-07 NOTE — Assessment & Plan Note (Signed)
See depression A/P.  GAD score improved to 5 today.  Continue Cymbalta 60 mg daily.  Follow-up with me in 6 months.

## 2017-10-08 DIAGNOSIS — F339 Major depressive disorder, recurrent, unspecified: Secondary | ICD-10-CM | POA: Diagnosis not present

## 2017-10-08 DIAGNOSIS — F411 Generalized anxiety disorder: Secondary | ICD-10-CM | POA: Diagnosis not present

## 2017-10-08 DIAGNOSIS — F410 Panic disorder [episodic paroxysmal anxiety] without agoraphobia: Secondary | ICD-10-CM | POA: Diagnosis not present

## 2017-10-08 DIAGNOSIS — F4010 Social phobia, unspecified: Secondary | ICD-10-CM | POA: Diagnosis not present

## 2017-10-16 DIAGNOSIS — F410 Panic disorder [episodic paroxysmal anxiety] without agoraphobia: Secondary | ICD-10-CM | POA: Diagnosis not present

## 2017-10-16 DIAGNOSIS — F339 Major depressive disorder, recurrent, unspecified: Secondary | ICD-10-CM | POA: Diagnosis not present

## 2017-10-16 DIAGNOSIS — F4010 Social phobia, unspecified: Secondary | ICD-10-CM | POA: Diagnosis not present

## 2017-10-16 DIAGNOSIS — F411 Generalized anxiety disorder: Secondary | ICD-10-CM | POA: Diagnosis not present

## 2017-10-22 ENCOUNTER — Encounter: Payer: Self-pay | Admitting: Physical Therapy

## 2017-10-29 DIAGNOSIS — F410 Panic disorder [episodic paroxysmal anxiety] without agoraphobia: Secondary | ICD-10-CM | POA: Diagnosis not present

## 2017-10-29 DIAGNOSIS — F4010 Social phobia, unspecified: Secondary | ICD-10-CM | POA: Diagnosis not present

## 2017-10-29 DIAGNOSIS — F411 Generalized anxiety disorder: Secondary | ICD-10-CM | POA: Diagnosis not present

## 2017-10-29 DIAGNOSIS — F339 Major depressive disorder, recurrent, unspecified: Secondary | ICD-10-CM | POA: Diagnosis not present

## 2017-11-13 DIAGNOSIS — F339 Major depressive disorder, recurrent, unspecified: Secondary | ICD-10-CM | POA: Diagnosis not present

## 2017-11-13 DIAGNOSIS — F410 Panic disorder [episodic paroxysmal anxiety] without agoraphobia: Secondary | ICD-10-CM | POA: Diagnosis not present

## 2017-11-13 DIAGNOSIS — F4010 Social phobia, unspecified: Secondary | ICD-10-CM | POA: Diagnosis not present

## 2017-11-13 DIAGNOSIS — F411 Generalized anxiety disorder: Secondary | ICD-10-CM | POA: Diagnosis not present

## 2017-11-27 DIAGNOSIS — F411 Generalized anxiety disorder: Secondary | ICD-10-CM | POA: Diagnosis not present

## 2017-11-27 DIAGNOSIS — F339 Major depressive disorder, recurrent, unspecified: Secondary | ICD-10-CM | POA: Diagnosis not present

## 2017-11-27 DIAGNOSIS — F410 Panic disorder [episodic paroxysmal anxiety] without agoraphobia: Secondary | ICD-10-CM | POA: Diagnosis not present

## 2017-11-27 DIAGNOSIS — F4010 Social phobia, unspecified: Secondary | ICD-10-CM | POA: Diagnosis not present

## 2017-12-10 DIAGNOSIS — F411 Generalized anxiety disorder: Secondary | ICD-10-CM | POA: Diagnosis not present

## 2017-12-10 DIAGNOSIS — F339 Major depressive disorder, recurrent, unspecified: Secondary | ICD-10-CM | POA: Diagnosis not present

## 2017-12-10 DIAGNOSIS — F4010 Social phobia, unspecified: Secondary | ICD-10-CM | POA: Diagnosis not present

## 2017-12-10 DIAGNOSIS — F410 Panic disorder [episodic paroxysmal anxiety] without agoraphobia: Secondary | ICD-10-CM | POA: Diagnosis not present

## 2017-12-17 DIAGNOSIS — F4010 Social phobia, unspecified: Secondary | ICD-10-CM | POA: Diagnosis not present

## 2017-12-17 DIAGNOSIS — F410 Panic disorder [episodic paroxysmal anxiety] without agoraphobia: Secondary | ICD-10-CM | POA: Diagnosis not present

## 2017-12-17 DIAGNOSIS — F411 Generalized anxiety disorder: Secondary | ICD-10-CM | POA: Diagnosis not present

## 2017-12-17 DIAGNOSIS — F339 Major depressive disorder, recurrent, unspecified: Secondary | ICD-10-CM | POA: Diagnosis not present

## 2017-12-24 DIAGNOSIS — F339 Major depressive disorder, recurrent, unspecified: Secondary | ICD-10-CM | POA: Diagnosis not present

## 2017-12-24 DIAGNOSIS — F4010 Social phobia, unspecified: Secondary | ICD-10-CM | POA: Diagnosis not present

## 2017-12-24 DIAGNOSIS — F411 Generalized anxiety disorder: Secondary | ICD-10-CM | POA: Diagnosis not present

## 2017-12-24 DIAGNOSIS — F410 Panic disorder [episodic paroxysmal anxiety] without agoraphobia: Secondary | ICD-10-CM | POA: Diagnosis not present

## 2018-01-02 DIAGNOSIS — F339 Major depressive disorder, recurrent, unspecified: Secondary | ICD-10-CM | POA: Diagnosis not present

## 2018-01-02 DIAGNOSIS — F4010 Social phobia, unspecified: Secondary | ICD-10-CM | POA: Diagnosis not present

## 2018-01-02 DIAGNOSIS — F411 Generalized anxiety disorder: Secondary | ICD-10-CM | POA: Diagnosis not present

## 2018-01-02 DIAGNOSIS — F410 Panic disorder [episodic paroxysmal anxiety] without agoraphobia: Secondary | ICD-10-CM | POA: Diagnosis not present

## 2018-01-03 ENCOUNTER — Encounter: Payer: Self-pay | Admitting: Family Medicine

## 2018-01-03 ENCOUNTER — Ambulatory Visit: Payer: BLUE CROSS/BLUE SHIELD | Admitting: Family Medicine

## 2018-01-03 DIAGNOSIS — F411 Generalized anxiety disorder: Secondary | ICD-10-CM

## 2018-01-03 DIAGNOSIS — F331 Major depressive disorder, recurrent, moderate: Secondary | ICD-10-CM

## 2018-01-03 MED ORDER — DULOXETINE HCL 30 MG PO CPEP: ORAL_CAPSULE | ORAL | 0 refills | Status: DC

## 2018-01-03 MED ORDER — BUPROPION HCL ER (XL) 150 MG PO TB24: 150.0000 mg | ORAL_TABLET | Freq: Every day | ORAL | 1 refills | Status: DC

## 2018-01-03 NOTE — Patient Instructions (Addendum)
We will start wellbutrin today. We will need to cross taper your medications  For your cymbalta, please decrease to 30mg  daily for 1 week, then 30mg  every other day for 1 week.  For the wellbutrin, please start at 1 pill daily for 1 week, then increase to 2 pills daily.  Come back to see me in 4 weeks, or sooner as needed.   Take care, Dr [name]

## 2018-01-03 NOTE — Assessment & Plan Note (Signed)
PHQ 9 significantly elevated today.  Discussed treatment options with patient.  He had quite a bit of sexual side effects to Cymbalta and SSRIs.  He would prefer starting a new medication without the side effects.  Will start Wellbutrin 150 mg daily and increase to 300 mg daily in 1 week.  Discussed possible side effects of this medication.  Advised him that this would help with his depression but may not help with his anxiety type symptoms.  Patient voiced understanding. He will continue seeing his therapist.   We will taper off his Cymbalta over the next 2 weeks.  Will cut to 30 mg daily for the next week, then 30 mg every other day for another week.  He will follow-up with me in 4 to 6 weeks, or sooner as needed.  Consider trial of Trintellix or Effexor in the future.  Would defer TCA trial until after trying both of those medications.

## 2018-01-03 NOTE — Progress Notes (Signed)
    Subjective:  Ethan Osborn is a 52 y.o. male who presents today with a chief complaint of depression follow up.   HPI:  Depression/Anxiety, established problems, worsening Patient last seen about 3 months ago.  At that time he was doing well on Cymbalta 60 mg daily.  Over the last few weeks to month, symptoms have significantly worsened.  He has been seeing his therapist who suggested that he discuss having his medication change.  He has been on several medications in the past including Zoloft and Lexapro.  His therapist has been helping him significantly with his anxiety.  Has also had increased stress at home due to the health of his parents.  No other major life changes since our last visit.  Depression screen PHQ 2/9 01/03/2018  Decreased Interest 3  Down, Depressed, Hopeless 2  PHQ - 2 Score 5  Altered sleeping 3  Tired, decreased energy 3  Change in appetite 1  Feeling bad or failure about yourself  2  Trouble concentrating 2  Moving slowly or fidgety/restless 1  Suicidal thoughts 1  PHQ-9 Score 18  Difficult doing work/chores Very difficult   GAD 7 : Generalized Anxiety Score 01/03/2018  Nervous, Anxious, on Edge 2  Control/stop worrying 3  Worry too much - different things 3  Trouble relaxing 2  Restless 2  Easily annoyed or irritable 3  Afraid - awful might happen 2  Total GAD 7 Score 17  Anxiety Difficulty Very difficult    ROS: No SI or HI.  Lexapro, zoloft, cymbalta  ROS: Per HPI  Objective:  Physical Exam: BP 134/68 (BP Location: Right Arm, Patient Position: Sitting, Cuff Size: Normal)   Pulse (!) 59   Temp 98.5 F (36.9 C) (Oral)   Ht 5' 10.5" (1.791 m)   Wt 222 lb 12.8 oz (101.1 kg)   SpO2 95%   BMI 31.52 kg/m   Gen: NAD, resting comfortably Neuro: Grossly normal, moves all extremities Psych: Normal affect and thought content  Assessment/Plan:  Moderate recurrent major depression (HCC) PHQ 9 significantly elevated today.  Discussed  treatment options with patient.  He had quite a bit of sexual side effects to Cymbalta and SSRIs.  He would prefer starting a new medication without the side effects.  Will start Wellbutrin 150 mg daily and increase to 300 mg daily in 1 week.  Discussed possible side effects of this medication.  Advised him that this would help with his depression but may not help with his anxiety type symptoms.  Patient voiced understanding. He will continue seeing his therapist.   We will taper off his Cymbalta over the next 2 weeks.  Will cut to 30 mg daily for the next week, then 30 mg every other day for another week.  He will follow-up with me in 4 to 6 weeks, or sooner as needed.  Consider trial of Trintellix or Effexor in the future.  Would defer TCA trial until after trying both of those medications.   GAD (generalized anxiety disorder) See depression A/P.  Start Wellbutrin, wean off Cymbalta.  Follow-up in 4 to 6 weeks.  Continue psychotherapy.  Discussed that Wellbutrin would not have significant effect on his anxiety symptoms, patient voiced understanding and will follow up with a psychotherapist for this.  Algis Greenhouse. [name], MD 01/03/2018 11:33 AM

## 2018-01-03 NOTE — Assessment & Plan Note (Signed)
See depression A/P.  Start Wellbutrin, wean off Cymbalta.  Follow-up in 4 to 6 weeks.  Continue psychotherapy.  Discussed that Wellbutrin would not have significant effect on his anxiety symptoms, patient voiced understanding and will follow up with a psychotherapist for this.

## 2018-01-09 DIAGNOSIS — F4010 Social phobia, unspecified: Secondary | ICD-10-CM | POA: Diagnosis not present

## 2018-01-09 DIAGNOSIS — F410 Panic disorder [episodic paroxysmal anxiety] without agoraphobia: Secondary | ICD-10-CM | POA: Diagnosis not present

## 2018-01-09 DIAGNOSIS — F411 Generalized anxiety disorder: Secondary | ICD-10-CM | POA: Diagnosis not present

## 2018-01-09 DIAGNOSIS — F339 Major depressive disorder, recurrent, unspecified: Secondary | ICD-10-CM | POA: Diagnosis not present

## 2018-01-15 DIAGNOSIS — F4010 Social phobia, unspecified: Secondary | ICD-10-CM | POA: Diagnosis not present

## 2018-01-15 DIAGNOSIS — F339 Major depressive disorder, recurrent, unspecified: Secondary | ICD-10-CM | POA: Diagnosis not present

## 2018-01-15 DIAGNOSIS — F410 Panic disorder [episodic paroxysmal anxiety] without agoraphobia: Secondary | ICD-10-CM | POA: Diagnosis not present

## 2018-01-15 DIAGNOSIS — F411 Generalized anxiety disorder: Secondary | ICD-10-CM | POA: Diagnosis not present

## 2018-01-23 ENCOUNTER — Other Ambulatory Visit: Payer: Self-pay

## 2018-01-23 ENCOUNTER — Encounter: Payer: Self-pay | Admitting: Family Medicine

## 2018-01-23 MED ORDER — BUPROPION HCL ER (XL) 150 MG PO TB24: 300.0000 mg | ORAL_TABLET | Freq: Every day | ORAL | 1 refills | Status: DC

## 2018-01-29 DIAGNOSIS — F4010 Social phobia, unspecified: Secondary | ICD-10-CM | POA: Diagnosis not present

## 2018-01-29 DIAGNOSIS — F410 Panic disorder [episodic paroxysmal anxiety] without agoraphobia: Secondary | ICD-10-CM | POA: Diagnosis not present

## 2018-01-29 DIAGNOSIS — F339 Major depressive disorder, recurrent, unspecified: Secondary | ICD-10-CM | POA: Diagnosis not present

## 2018-01-29 DIAGNOSIS — F411 Generalized anxiety disorder: Secondary | ICD-10-CM | POA: Diagnosis not present

## 2018-01-31 ENCOUNTER — Encounter: Payer: Self-pay | Admitting: Family Medicine

## 2018-01-31 ENCOUNTER — Ambulatory Visit: Payer: BLUE CROSS/BLUE SHIELD | Admitting: Family Medicine

## 2018-01-31 VITALS — BP 130/72 | HR 69 | Temp 98.1°F | Ht 71.0 in | Wt 224.4 lb

## 2018-01-31 DIAGNOSIS — F331 Major depressive disorder, recurrent, moderate: Secondary | ICD-10-CM

## 2018-01-31 DIAGNOSIS — F411 Generalized anxiety disorder: Secondary | ICD-10-CM | POA: Diagnosis not present

## 2018-01-31 MED ORDER — BUPROPION HCL ER (XL) 150 MG PO TB24: 450.0000 mg | ORAL_TABLET | Freq: Every day | ORAL | 1 refills | Status: DC

## 2018-01-31 NOTE — Patient Instructions (Signed)
It was very nice to see you today!  Please increase your wellbutrin to 450mg  daily.  Will take about 6 weeks for this medication to reach its maximal effectiveness.  Please come back to see me in 6 to 8 weeks, or sooner as needed.  Take care, Dr [name]

## 2018-01-31 NOTE — Assessment & Plan Note (Signed)
Improving, but still not at goal.  We will continue Wellbutrin, though do not think that this is going to have a significant impact on his anxiety levels.  He will continue seeing his therapist.  Follow-up with me in 6 to 8 weeks.

## 2018-01-31 NOTE — Assessment & Plan Note (Signed)
Improving on Wellbutrin 300 mg daily, however still with some depressive symptoms.  Discussed treatment options with patient.  We will increase Wellbutrin to 450 mg daily.  He will follow-up with me in 4 to 6 weeks.  Consider trial of Trintellix or Effexor in the future if no improvement.

## 2018-01-31 NOTE — Progress Notes (Signed)
   Subjective:  Ethan Osborn is a 52 y.o. male who presents today with a chief complaint of depression follow up.   HPI:  Depression/Anxiety, established problems, improving.  Saw 1 month ago for these problems.  He was on Cymbalta at that time which was not working for him.  We cross titrated him to Wellbutrin.  He is currently on Wellbutrin 300 mg daily.  He has stopped the Cymbalta.  He has been on his current dose of Wellbutrin for the last couple of weeks.  Overall, feels like his symptoms have improved, however did have 1 day of severe depression about a week ago.  This lasted for about a day.  Is still seeing his therapist and doing well.  Initially had some vivid dreams and nausea on the Wellbutrin, these have symptoms subsided.  Depression screen PHQ 2/9 01/31/2018  Decreased Interest 1  Down, Depressed, Hopeless 2  PHQ - 2 Score 3  Altered sleeping 0  Tired, decreased energy 1  Change in appetite 0  Feeling bad or failure about yourself  2  Trouble concentrating 1  Moving slowly or fidgety/restless 0  Suicidal thoughts 1  PHQ-9 Score 8  Difficult doing work/chores Somewhat difficult    GAD 7 : Generalized Anxiety Score 01/31/2018  Nervous, Anxious, on Edge 1  Control/stop worrying 1  Worry too much - different things 0  Trouble relaxing 1  Restless 0  Easily annoyed or irritable 2  Afraid - awful might happen 0  Total GAD 7 Score 5  Anxiety Difficulty Somewhat difficult    ROS: Per HPI  PMH: He reports that he has never smoked. He has never used smokeless tobacco. He reports that he drinks alcohol. He reports that he does not use drugs.  Objective:  Physical Exam: BP 130/72 (BP Location: Left Arm, Patient Position: Sitting, Cuff Size: Normal)   Pulse 69   Temp 98.1 F (36.7 C) (Oral)   Ht 5\' 11"  (1.803 m)   Wt 224 lb 6.4 oz (101.8 kg)   SpO2 98%   BMI 31.30 kg/m   Gen: NAD, resting comfortably Neuro: Grossly normal, moves all extremities Psych: Normal  affect and thought content  Assessment/Plan:  Moderate recurrent major depression (HCC) Improving on Wellbutrin 300 mg daily, however still with some depressive symptoms.  Discussed treatment options with patient.  We will increase Wellbutrin to 450 mg daily.  He will follow-up with me in 4 to 6 weeks.  Consider trial of Trintellix or Effexor in the future if no improvement.  GAD (generalized anxiety disorder) Improving, but still not at goal.  We will continue Wellbutrin, though do not think that this is going to have a significant impact on his anxiety levels.  He will continue seeing his therapist.  Follow-up with me in 6 to 8 weeks.  Algis Greenhouse. [name], MD 01/31/2018 8:40 AM

## 2018-02-03 ENCOUNTER — Ambulatory Visit: Payer: BLUE CROSS/BLUE SHIELD | Admitting: Family Medicine

## 2018-02-25 DIAGNOSIS — F339 Major depressive disorder, recurrent, unspecified: Secondary | ICD-10-CM | POA: Diagnosis not present

## 2018-02-25 DIAGNOSIS — F411 Generalized anxiety disorder: Secondary | ICD-10-CM | POA: Diagnosis not present

## 2018-02-25 DIAGNOSIS — F4010 Social phobia, unspecified: Secondary | ICD-10-CM | POA: Diagnosis not present

## 2018-02-25 DIAGNOSIS — F410 Panic disorder [episodic paroxysmal anxiety] without agoraphobia: Secondary | ICD-10-CM | POA: Diagnosis not present

## 2018-03-01 ENCOUNTER — Other Ambulatory Visit: Payer: Self-pay | Admitting: Family Medicine

## 2018-03-10 ENCOUNTER — Ambulatory Visit: Payer: Self-pay

## 2018-03-10 ENCOUNTER — Ambulatory Visit (INDEPENDENT_AMBULATORY_CARE_PROVIDER_SITE_OTHER): Payer: BLUE CROSS/BLUE SHIELD

## 2018-03-10 ENCOUNTER — Ambulatory Visit: Payer: BLUE CROSS/BLUE SHIELD | Admitting: Sports Medicine

## 2018-03-10 ENCOUNTER — Ambulatory Visit: Payer: BLUE CROSS/BLUE SHIELD | Admitting: Family

## 2018-03-10 ENCOUNTER — Encounter: Payer: Self-pay | Admitting: Sports Medicine

## 2018-03-10 VITALS — BP 154/98 | HR 56 | Ht 71.0 in | Wt 227.0 lb

## 2018-03-10 DIAGNOSIS — M25511 Pain in right shoulder: Secondary | ICD-10-CM

## 2018-03-10 DIAGNOSIS — S43004A Unspecified dislocation of right shoulder joint, initial encounter: Secondary | ICD-10-CM

## 2018-03-10 MED ORDER — IBUPROFEN-FAMOTIDINE 800-26.6 MG PO TABS: 1.0000 | ORAL_TABLET | Freq: Three times a day (TID) | ORAL | 0 refills | Status: AC | PRN

## 2018-03-10 MED ORDER — IBUPROFEN-FAMOTIDINE 800-26.6 MG PO TABS: 1.0000 | ORAL_TABLET | Freq: Three times a day (TID) | ORAL | 2 refills | Status: DC | PRN

## 2018-03-10 NOTE — Progress Notes (Signed)
Ethan Osborn. Ethan Osborn, [facility]  Ethan Osborn - 52 y.o. male MRN [id number]  Date of birth: 1966/04/29  Visit Date: 03/10/2018  PCP: Ethan Barrack, MD   Referred by: Ethan Barrack, MD  Scribe(s) for today's visit: Ethan Osborn, CMA  SUBJECTIVE:  Ethan Osborn is here for Initial Assessment (shoulder pain)   His R shoulder pain symptoms INITIALLY: Began this past Saturday while water rafting. He was seen at [facility] in [place].  And told he has a shoulder dislocation and underwent conscious sedation with reduction. Described as moderate throbbing, nonradiating Worsened with movement.  Improved with rest.  Additional associated symptoms include: He has noticed some swelling around the joint. He denies increased warmth or erythema.     At this time symptoms show no change compared to onset. He has been wearing sling daily but not at bedtime. He does have some tenderness at night when she sling is off. He has been taking IBU 600 mg with some relief. He has been icing his shoulder a couple of times a day with some relief.     REVIEW OF SYSTEMS: Reports night time disturbances. Denies fevers, chills, or night sweats. Denies unexplained weight loss. Denies personal history of cancer. Denies changes in bowel or bladder habits. Denies recent unreported falls. Denies new or worsening dyspnea or wheezing. Denies headaches or dizziness.  Denies numbness, tingling or weakness  In the extremities.  Denies dizziness or presyncopal episodes Denies lower extremity edema    HISTORY & PERTINENT PRIOR DATA:  Prior History reviewed and updated per electronic medical record.  Significant/pertinent history, findings, studies include:  reports that he has never smoked. He has never used smokeless tobacco. No results for input(s): HGBA1C, LABURIC, CREATINE in the last 8760 hours. No  specialty comments available. Problem  Shoulder Dislocation, Right, Initial Encounter    OBJECTIVE:  VS:  HT:5\' 11"  (180.3 cm)   WT:227 lb (103 kg)  BMI:31.67    BP:(Abnormal) 154/98  HR:(Abnormal) 56bpm  TEMP: ( )  RESP:97 %   PHYSICAL EXAM: Constitutional: WDWN, Non-toxic appearing. Psychiatric: Alert & appropriately interactive.  Not depressed or anxious appearing. Respiratory: No increased work of breathing.  Trachea Midline Eyes: Pupils are equal.  EOM intact without nystagmus.  No scleral icterus  Vascular Exam: warm to touch no edema  upper extremity neuro exam: unremarkable  MSK Exam: Right upper extremity is in a sling.  He is some generalized swelling across the entire anterior shoulder joint.  His internal rotation, external rotation, abduction and and forward flexion strength is intact to manual muscle testing while held at his side.  He is able to gently bring his hand over to the left shoulder but this is quite difficult.  His radial pulses are intact.  Finger strength and wrist strength are normal.  Normal sensation in the right upper extremity.   ASSESSMENT & PLAN:   1. Acute pain of right shoulder   2. Shoulder dislocation, right, initial encounter     PLAN: Given the injury of her weekend 2 days ago I would like for have him again in the sling for the next 2 weeks and return at that time for further evaluation and advancement of therapeutic exercises.  Can consider diagnostic at that point if having persistent significant symptoms.  We did discuss that following a dislocation of increased risk of repeat dislocation is elevated especially in the short-term.  He will avoid any type of overhead motion as well as abduction.  Okay to use his upper extremity at the desk and computer chair without significant limitations.  We will transition him over to [facility] and have him continue taking this twice daily to 3 times daily for the next 2 weeks.  Ice  frequently.  Follow-up: Return in about 2 weeks (around 03/24/2018).      Please see additional documentation for Objective, Assessment and Plan sections. Pertinent additional documentation may be included in corresponding procedure notes, imaging studies, problem based documentation and patient instructions. Please see these sections of the encounter for additional information regarding this visit.  CMA/ATC served as Education administrator during this visit. History, Physical, and Plan performed by medical provider. Documentation and orders reviewed and attested to.      Ethan Osborn, [facility] Physician

## 2018-03-10 NOTE — Patient Instructions (Signed)
Ethan Osborn pharmacy instructions for Duexis, Pennsaid and Vimovo:  Your prescription will be filled through a mail order pharmacy.  It is typically [facility] but may vary depending on where you live.  You will receive a phone call from them which will typically come from a [phone] phone number.  You must speak directly to them to have this medication filled.  When the pharmacy calls, they will need your mailing address (for overnight shipment of the medication) andy they will need payment information if you have a copay (typically no more than $10). If you have not heard from them 2-3 days after your appointment with Dr. Rigby, contact us at the office ([phone]) or through MyChart so we can reach back out to the pharmacy.  

## 2018-03-12 ENCOUNTER — Ambulatory Visit: Payer: BLUE CROSS/BLUE SHIELD | Admitting: Family Medicine

## 2018-03-18 DIAGNOSIS — F4010 Social phobia, unspecified: Secondary | ICD-10-CM | POA: Diagnosis not present

## 2018-03-18 DIAGNOSIS — F411 Generalized anxiety disorder: Secondary | ICD-10-CM | POA: Diagnosis not present

## 2018-03-18 DIAGNOSIS — F410 Panic disorder [episodic paroxysmal anxiety] without agoraphobia: Secondary | ICD-10-CM | POA: Diagnosis not present

## 2018-03-18 DIAGNOSIS — F339 Major depressive disorder, recurrent, unspecified: Secondary | ICD-10-CM | POA: Diagnosis not present

## 2018-03-20 ENCOUNTER — Other Ambulatory Visit: Payer: Self-pay | Admitting: Family Medicine

## 2018-03-25 ENCOUNTER — Encounter: Payer: Self-pay | Admitting: Family Medicine

## 2018-03-25 ENCOUNTER — Telehealth: Payer: Self-pay | Admitting: Sports Medicine

## 2018-03-25 ENCOUNTER — Ambulatory Visit (INDEPENDENT_AMBULATORY_CARE_PROVIDER_SITE_OTHER): Payer: BLUE CROSS/BLUE SHIELD | Admitting: Family Medicine

## 2018-03-25 DIAGNOSIS — F411 Generalized anxiety disorder: Secondary | ICD-10-CM

## 2018-03-25 DIAGNOSIS — F331 Major depressive disorder, recurrent, moderate: Secondary | ICD-10-CM

## 2018-03-25 NOTE — Telephone Encounter (Signed)
Copied from [place] [id number]. Topic: Quick Communication - Rx Refill/Question >> Mar 25, 2018  4:44 PM Waylan Rocher, Lumin L wrote: Medication: Ibuprofen-Famotidine (DUEXIS) 800-26.6 MG TABS (please call pharamcy about the PA submitted for this, need diagnosis and mscript history)  Has the patient contacted their pharmacy? Yes.   (Agent: If no, request that the patient contact the pharmacy for the refill.) (Agent: If yes, when and what did the pharmacy advise?)  Preferred Pharmacy (with phone number or street name): [facility] P:[phone]  Agent: Please be advised that RX refills may take up to 3 business days. We ask that you follow-up with your pharmacy.

## 2018-03-25 NOTE — Progress Notes (Signed)
   Subjective:  Ethan Osborn is a 52 y.o. male who presents today with a chief complaint of depression.   HPI:  Depression/Anxiety, established problems, improving Patient seen 6 weeks ago for this. At that time, we increased his Wellbutrin to 450mg  daily. He has done very well at this dose without side effects.    Depression screen PHQ 2/9 03/25/2018  Decreased Interest 1  Down, Depressed, Hopeless 0  PHQ - 2 Score 1  Altered sleeping 0  Tired, decreased energy 1  Change in appetite 0  Feeling bad or failure about yourself  1  Trouble concentrating 1  Moving slowly or fidgety/restless 0  Suicidal thoughts 0  PHQ-9 Score 4  Difficult doing work/chores -    GAD 7 : Generalized Anxiety Score 03/25/2018  Nervous, Anxious, on Edge 0  Control/stop worrying 1  Worry too much - different things 1  Trouble relaxing 0  Restless 0  Easily annoyed or irritable 1  Afraid - awful might happen 0  Total GAD 7 Score 3  Anxiety Difficulty Somewhat difficult    ROS: No SI or HI.    ROS: Per HPI  Objective:  Physical Exam: BP 134/84 (BP Location: Left Arm, Patient Position: Sitting, Cuff Size: Normal)   Pulse (!) 59   Temp 98.4 F (36.9 C) (Oral)   Ht 5\' 11"  (1.803 m)   Wt 227 lb 3.2 oz (103.1 kg)   SpO2 99%   BMI 31.69 kg/m   Gen: NAD, resting comfortably Neuro: Grossly normal, moves all extremities Psych: Normal affect and thought content  Assessment/Plan:  GAD (generalized anxiety disorder) Doing very well on Wellbutrin 450 mg daily.  We will continue this.  Follow-up with me in 3 to 6 months.  Discussed reasons to return to care earlier.  Moderate recurrent major depression ([place]) Patient has had significant improvement in symptoms on Wellbutrin 450 mg daily.  We will continue. Follow up in 3-6 months.   Algis Greenhouse. [name], MD 03/25/2018 4:50 PM

## 2018-03-25 NOTE — Assessment & Plan Note (Signed)
Patient has had significant improvement in symptoms on Wellbutrin 450 mg daily.  We will continue. Follow up in 3-6 months.

## 2018-03-25 NOTE — Assessment & Plan Note (Signed)
Doing very well on Wellbutrin 450 mg daily.  We will continue this.  Follow-up with me in 3 to 6 months.  Discussed reasons to return to care earlier.

## 2018-03-25 NOTE — Patient Instructions (Signed)
It was very nice to see you today!  I am glad that you are doing well!  No medication changes today.  Come back to see me in 3-6 months or sooner as needed.  Take care, Dr [name]

## 2018-03-26 NOTE — Telephone Encounter (Signed)
Called and provided info for PA for Duexis.

## 2018-04-08 DIAGNOSIS — F411 Generalized anxiety disorder: Secondary | ICD-10-CM | POA: Diagnosis not present

## 2018-04-08 DIAGNOSIS — F4010 Social phobia, unspecified: Secondary | ICD-10-CM | POA: Diagnosis not present

## 2018-04-08 DIAGNOSIS — F339 Major depressive disorder, recurrent, unspecified: Secondary | ICD-10-CM | POA: Diagnosis not present

## 2018-04-08 DIAGNOSIS — F410 Panic disorder [episodic paroxysmal anxiety] without agoraphobia: Secondary | ICD-10-CM | POA: Diagnosis not present

## 2018-04-11 ENCOUNTER — Other Ambulatory Visit: Payer: Self-pay

## 2018-04-11 MED ORDER — BUPROPION HCL ER (XL) 150 MG PO TB24: 450.0000 mg | ORAL_TABLET | Freq: Every day | ORAL | 1 refills | Status: DC

## 2018-04-23 DIAGNOSIS — F411 Generalized anxiety disorder: Secondary | ICD-10-CM | POA: Diagnosis not present

## 2018-04-23 DIAGNOSIS — F410 Panic disorder [episodic paroxysmal anxiety] without agoraphobia: Secondary | ICD-10-CM | POA: Diagnosis not present

## 2018-04-23 DIAGNOSIS — F4010 Social phobia, unspecified: Secondary | ICD-10-CM | POA: Diagnosis not present

## 2018-04-23 DIAGNOSIS — F339 Major depressive disorder, recurrent, unspecified: Secondary | ICD-10-CM | POA: Diagnosis not present

## 2018-04-29 DIAGNOSIS — F411 Generalized anxiety disorder: Secondary | ICD-10-CM | POA: Diagnosis not present

## 2018-04-29 DIAGNOSIS — F4010 Social phobia, unspecified: Secondary | ICD-10-CM | POA: Diagnosis not present

## 2018-04-29 DIAGNOSIS — F410 Panic disorder [episodic paroxysmal anxiety] without agoraphobia: Secondary | ICD-10-CM | POA: Diagnosis not present

## 2018-04-29 DIAGNOSIS — F339 Major depressive disorder, recurrent, unspecified: Secondary | ICD-10-CM | POA: Diagnosis not present

## 2018-05-07 ENCOUNTER — Encounter: Payer: Self-pay | Admitting: Family Medicine

## 2018-05-08 DIAGNOSIS — F339 Major depressive disorder, recurrent, unspecified: Secondary | ICD-10-CM | POA: Diagnosis not present

## 2018-05-08 DIAGNOSIS — F411 Generalized anxiety disorder: Secondary | ICD-10-CM | POA: Diagnosis not present

## 2018-05-08 DIAGNOSIS — F410 Panic disorder [episodic paroxysmal anxiety] without agoraphobia: Secondary | ICD-10-CM | POA: Diagnosis not present

## 2018-05-08 DIAGNOSIS — F4010 Social phobia, unspecified: Secondary | ICD-10-CM | POA: Diagnosis not present

## 2018-06-17 ENCOUNTER — Encounter: Payer: Self-pay | Admitting: Family Medicine

## 2018-07-01 ENCOUNTER — Encounter: Payer: Self-pay | Admitting: Family Medicine

## 2018-07-01 ENCOUNTER — Ambulatory Visit: Payer: BLUE CROSS/BLUE SHIELD | Admitting: Family Medicine

## 2018-07-01 VITALS — BP 126/78 | HR 60 | Temp 98.6°F | Ht 71.0 in | Wt 229.6 lb

## 2018-07-01 DIAGNOSIS — L570 Actinic keratosis: Secondary | ICD-10-CM | POA: Insufficient documentation

## 2018-07-01 NOTE — Progress Notes (Signed)
   Subjective:  Ethan Osborn is a 52 y.o. male who presents today with a chief complaint of skin lesion.   HPI:  Skin Lesion, new problem Started 4-5 weeks ago.  Located behind his left ear.  Occasionally pruritic.  No obvious precipitating events.  He has tried several over-the-counter creams including hydrocortisone which has not helped.  Symptoms are stable.  Some redness to the area.  No pain.  No discharge.  No other obvious alleviating or aggravating factors.  ROS: Per HPI  PMH: He reports that he has never smoked. He has never used smokeless tobacco. He reports that he drinks alcohol. He reports that he does not use drugs.  Objective:  Physical Exam: BP 126/78 (BP Location: Left Arm, Patient Position: Sitting, Cuff Size: Normal)   Pulse 60   Temp 98.6 F (37 C) (Oral)   Ht 5\' 11"  (1.803 m)   Wt 229 lb 9.6 oz (104.1 kg)   SpO2 96%   BMI 32.02 kg/m   Gen: NAD, resting comfortably Skin: Approximately 10 to 12 mm erythematous hyperkeratotic lesion just posterior to left auricle.  Cryotherapy Procedure Note  Pre-operative Diagnosis: Actinic keratosis  Locations: Left mastoid process  Indications: Therapeutic  Procedure Details  Patient informed of risks (permanent scarring, infection, light or dark discoloration, bleeding, infection, weakness, numbness and recurrence of the lesion) and benefits of the procedure and verbal informed consent obtained.  The areas are treated with liquid nitrogen therapy, frozen until ice ball extended 3 mm beyond lesion, allowed to thaw, and treated again. The patient tolerated procedure well.  The patient was instructed on post-op care, warned that there may be blister formation, redness and pain. Recommend OTC analgesia as needed for pain.  Condition: Stable  Complications: none.  Assessment/Plan:  Actinic keratosis Skin lesion consistent with actinic keratoses.  Cryotherapy performed today.  Please see above procedure note.  Patient  tolerated well.  Recommended over-the-counter analgesics as needed.  Discussed reasons return to care.  Consider punch biopsy if no improvement with cryo.    Algis Greenhouse. [name], MD 07/01/2018 3:35 PM

## 2018-07-01 NOTE — Patient Instructions (Signed)
It was very nice to see you today!  We froze the spot behind your ear today. I think this was an actinic keratosis which is a benign, precancerous lesion.  Please let me know if you still have issues after 1-2 weeks.  Take care, Dr [name]   Cryosurgery for Skin Conditions, Care After These instructions give you information on caring for yourself after your procedure. Your doctor may also give you more specific instructions. Call your doctor if you have any problems or questions after your procedure. Follow these instructions at home: Caring for the treated area  Follow instructions from your doctor about how to take care of the treated area. Make sure you: ? Clean the treated area with soap and water.  Check the treated area every day for signs of infection. Check for: ? More redness, swelling, or pain. ? More fluid or blood. ? Warmth. ? Pus or a bad smell. General instructions  Do not pick at your blister. Do not try to break it open. This can cause infection and scarring.  Do not put any medicine, cream, or lotion on the treated area unless told by your doctor.  Take over-the-counter and prescription medicines only as told by your doctor.  Keep all follow-up visits as told by your doctor. This is important. Contact a doctor if:  You have more redness, swelling, or pain around the treated area.  You have more fluid or blood coming from the treated area.  The treated area feels warm to the touch.  You have pus or a bad smell coming from the treated area.  Your blister gets large and painful. Get help right away if:  You have a fever and have redness spreading from the treated area. Summary  You should keep the treated area and your bandage clean and dry.  Check the treated area every day for signs of infection. Signs include fluid, pus, warmth, or having more redness, swelling, or pain.  Do not pick at your blister. Do not try to break it open. This information is  not intended to replace advice given to you by your health care provider. Make sure you discuss any questions you have with your health care provider. Document Released: 10/22/2011 Document Revised: 06/18/2016 Document Reviewed: 06/18/2016 Elsevier Interactive Patient Education  2017 [employer].

## 2018-07-01 NOTE — Assessment & Plan Note (Signed)
Skin lesion consistent with actinic keratoses.  Cryotherapy performed today.  Please see above procedure note.  Patient tolerated well.  Recommended over-the-counter analgesics as needed.  Discussed reasons return to care.  Consider punch biopsy if no improvement with cryo.

## 2018-07-28 DIAGNOSIS — F331 Major depressive disorder, recurrent, moderate: Secondary | ICD-10-CM | POA: Diagnosis not present

## 2018-07-28 DIAGNOSIS — F411 Generalized anxiety disorder: Secondary | ICD-10-CM | POA: Diagnosis not present

## 2018-08-01 ENCOUNTER — Other Ambulatory Visit (INDEPENDENT_AMBULATORY_CARE_PROVIDER_SITE_OTHER): Payer: BLUE CROSS/BLUE SHIELD

## 2018-08-01 ENCOUNTER — Ambulatory Visit (INDEPENDENT_AMBULATORY_CARE_PROVIDER_SITE_OTHER): Payer: BLUE CROSS/BLUE SHIELD | Admitting: Family Medicine

## 2018-08-01 DIAGNOSIS — F339 Major depressive disorder, recurrent, unspecified: Secondary | ICD-10-CM

## 2018-08-01 DIAGNOSIS — F419 Anxiety disorder, unspecified: Secondary | ICD-10-CM | POA: Diagnosis not present

## 2018-08-01 DIAGNOSIS — R5383 Other fatigue: Secondary | ICD-10-CM | POA: Diagnosis not present

## 2018-08-01 DIAGNOSIS — E538 Deficiency of other specified B group vitamins: Secondary | ICD-10-CM

## 2018-08-01 LAB — TSH: TSH: 3.36 u[IU]/mL (ref 0.35–4.50)

## 2018-08-01 LAB — TESTOSTERONE: Testosterone: 281.97 ng/dL — ABNORMAL LOW (ref 300.00–890.00)

## 2018-08-01 LAB — FOLATE: FOLATE: 13.8 ng/mL (ref >5.9)

## 2018-08-01 LAB — VITAMIN B12: Vitamin B-12: 486 pg/mL (ref 211–911)

## 2018-08-01 LAB — VITAMIN D 25 HYDROXY (VIT D DEFICIENCY, FRACTURES): VITD: 25.92 ng/mL — ABNORMAL LOW (ref 30.00–100.00)

## 2018-08-01 MED ORDER — CYANOCOBALAMIN 1000 MCG/ML IJ SOLN: 1000.0000 ug | Freq: Once | INTRAMUSCULAR | Status: DC

## 2018-08-01 NOTE — Progress Notes (Signed)
Please inform patient of the following:  Testosterone and vit D are both low. These both could be contributing to some fatigue. Everything else is normal. Would like for him to come in for OV to discuss treatment options for testosterone if he is interested. If so, we will need to check another testosterone level as insurance typically requires 2 low levels to pay for replacement therapy.   Ethan Osborn. [name], MD 08/01/2018 5:32 PM

## 2018-08-04 ENCOUNTER — Encounter: Payer: Self-pay | Admitting: Family Medicine

## 2018-08-04 ENCOUNTER — Ambulatory Visit: Payer: BLUE CROSS/BLUE SHIELD | Admitting: Family Medicine

## 2018-08-04 VITALS — BP 128/74 | HR 67 | Temp 98.5°F | Ht 71.0 in | Wt 229.6 lb

## 2018-08-04 DIAGNOSIS — R7989 Other specified abnormal findings of blood chemistry: Secondary | ICD-10-CM | POA: Insufficient documentation

## 2018-08-04 NOTE — Progress Notes (Signed)
   Subjective:  Ethan Osborn is a 52 y.o. male who presents today with a chief complaint of low testosterone.   HPI:  Low Testosterone, new problem to provider Found on blood work last week.  Reportedly has been told that he has had low testosterone in the past but did not bother with replacement therapy.  Has decreased energy and fatigue.  Some decreased libido issues.  No erectile dysfunction.  Has never been on replacement in the past.  Low Vitamin D, new problem Also found on blood work from last week.  Has never had low vitamin D in the past.  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  Never take vitamin D supplementation in the past.  ROS: Per HPI  PMH: He reports that he has never smoked. He has never used smokeless tobacco. He reports current alcohol use. He reports that he does not use drugs.  Objective:  Physical Exam: BP 128/74 (BP Location: Left Arm, Patient Position: Sitting, Cuff Size: Normal)   Pulse 67   Temp 98.5 F (36.9 C) (Oral)   Ht 5\' 11"  (1.803 m)   Wt 229 lb 9.6 oz (104.1 kg)   SpO2 96%   BMI 32.02 kg/m   Gen: NAD, resting comfortably Neuro: Grossly normal, moves all extremities Psych: Normal affect and thought content  Assessment/Plan:  Low vitamin D level Discussed treatment options.  He will start over-the-counter vitamin D 5000 international units daily.  Recheck vitamin D level in 3 months.  Low testosterone Discussed low results and treatment options.  He will return early next week for early morning testosterone levels to verify.  Check CBC and CMET without blood draw as well.  He is interested in testosterone replacement.  Discussed potential side effects including VTE, heart attack, stroke, prostate cancer, and testicular cancer.  Patient voiced understanding.  If repeat testing is low, they would like to start topical therapy with AndroGel.  We will have him come back in 3 months after starting replacement for repeat testosterone  level.  Time Spent: I spent 25 minutes face-to-face with the patient, with more than half spent on coordinating care and counseling for treatment plan for his low vitamin D and low testosterone.  Algis Greenhouse. [name], MD 08/04/2018 5:03 PM

## 2018-08-04 NOTE — Progress Notes (Signed)
Pt presented for labs. He was not evalutated.  Algis Greenhouse. [name], MD 08/04/2018 11:40 AM

## 2018-08-04 NOTE — Patient Instructions (Signed)
It was very nice to see you today!  You have low testosterone and low vitamin D.  Please take 5000 international units of vitamin D daily.  I would like to get your numbers up to the 50-60 range.  I think this will help with your energy levels if we are able to do this.  Please come back to see me in 3 months to have your levels rechecked.  I would like for you to come back in the next 1 to 2 weeks to have your testosterone checked early in the morning.  We need to do this to have your insurance approved testosterone replacement.  Assuming that your levels are low again, we will send in a topical gel.  It is very important that you avoid physical contact with any children or women while using the gel.  Improving your testosterone numbers should help with your energy levels.  I would like for you to come back in 3 months after starting replacement to have your levels recheck. Please let me know if you have any side effects to the medication.   Take care, Dr [name]   Testosterone Replacement Therapy  Testosterone replacement therapy (TRT) is used to treat men who have a low testosterone level (hypogonadism). Testosterone is a male hormone that is produced in the testicles. It is responsible for typically male characteristics and for maintaining a man's sex drive and the ability to get an erection. Testosterone also supports bone and muscle health. TRT can be a gel, liquid, or patch that you put on your skin. It can also be in the form of a tablet or an injection. In some cases, your health care provider may insert long-acting pellets under your skin. In most men, the level of testosterone starts to decline gradually after age 36. Low testosterone can also be caused by certain medical conditions, medicines, and obesity. Your health care provider can diagnose hypogonadism with at least two blood tests that are done early in the morning. Low testosterone may not need to be treated. TRT is usually a choice  that you make with your health care provider. Your health care provider may recommend TRT if you have low testosterone that is causing symptoms, such as:  Low sex drive.  Erection problems.  Breast enlargement.  Loss of body hair.  Weak muscles or bones.  Shrinking testicles.  Increased body fat.  Low energy.  Hot flashes.  Depression.  Decreased work Systems analyst. TRT is a lifetime treatment. If you stop treatment, your testosterone will drop, and your symptoms may return. What are the risks? Testosterone replacement therapy may have side effects, including:  Lower sperm count.  Skin irritation at the application or injection site.  Mouth irritation if you take an oral tablet.  Acne.  Swelling of your legs or feet.  Tender breasts.  Dizziness.  Sleep disturbance.  Mood swings.  Possible increased risk of stroke or heart attack. Testosterone replacement therapy may also increase your risk for prostate cancer or male breast cancer. You should not use TRT if you have either of those conditions. Your health care provider also may not recommend TRT if:  You are suspected of having prostate cancer.  You want to father a child.  You have a high number of red blood cells.  You have untreated sleep apnea.  You have a very large prostate. Supplies needed:  Your health care provider will prescribe the testosterone gel, solution, or medicine that you need. If your health care  provider teaches you to do self-injections at home, you will also need: ? Your medicine vial. ? Disposable needles and syringes. ? Alcohol swabs. ? A needle disposal container. ? Adhesive bandages. How to use testosterone replacement therapy Your health care provider will help you find the TRT option that will work best for you based on your preference, the side effects, and the cost. You may:  Rub testosterone gel on your upper arm or shoulder every day after a shower. This is the most  common type of TRT. Do not let women or children come in contact with the gel.  Apply a testosterone solution under your arms once each day.  Place a testosterone patch on your skin once each day.  Dissolve a testosterone tablet in your mouth twice each day.  Have a testosterone pellet inserted under your skin by your health care provider. This will be replaced every 3-6 months.  Use testosterone nasal spray three times each day.  Get testosterone injections. For some types of testosterone, your health care provider will give you this injection. With other types of testosterone, you may be taught to give injections to yourself. The frequency of injections may vary based on the type of testosterone that you receive. Follow these instructions at home:  Take over-the-counter and prescription medicines only as told by your health care provider.  Lose weight if you are overweight. Ask your health care provider to help you start a healthy diet and exercise program to reach and maintain a healthy weight.  Work with your health care provider to treat other medical conditions that may lower your testosterone. These include obesity, high blood pressure, high cholesterol, diabetes, liver disease, kidney disease, and sleep apnea.  Keep all follow-up visits as told by your health care provider. This is important. General recommendations  Discuss all risks and benefits with your health care provider before starting therapy.  Work with your health care provider to check your prostate health and do blood testing before you start therapy.  Do not use any testosterone replacement therapies that are not prescribed by your health care provider or not approved for use in the [place]  Do not use TRT for bodybuilding or to improve sexual performance. TRT should be used only to treat symptoms of low testosterone.  Return for all repeat prostate checks and blood tests during therapy, as told by your health care  provider. Where to find more information Learn more about testosterone replacement therapy from:  [facility]: www.urologyhealth.org/urologic-conditions/low-testosterone-(hypogonadism)  Endocrine Society: www.hormone.org/diseases-and-conditions/mens-health/hypogonadism Contact a health care provider if:  You have side effects from your testosterone replacement therapy.  You continue to have symptoms of low testosterone during treatment.  You develop new symptoms during treatment. Summary  Testosterone replacement therapy is only for men who have low testosterone as determined by blood testing and who have symptoms of low testosterone.  Testosterone replacement therapy should be prescribed only by a health care provider and should be used under the supervision of a health care provider.  You may not be able to take testosterone if you have certain medical conditions, including prostate cancer, male breast cancer, or heart disease.  Testosterone replacement therapy may have side effects and may make some medical conditions worse.  Talk with your health care provider about all the risks and benefits before you start therapy. This information is not intended to replace advice given to you by your health care provider. Make sure you discuss any questions you have with your  health care provider. Document Released: 04/19/2016 Document Revised: 04/19/2016 Document Reviewed: 04/19/2016 Elsevier Interactive Patient Education  2019 [employer].

## 2018-08-04 NOTE — Assessment & Plan Note (Signed)
Discussed treatment options.  He will start over-the-counter vitamin D 5000 international units daily.  Recheck vitamin D level in 3 months.

## 2018-08-04 NOTE — Assessment & Plan Note (Signed)
Discussed low results and treatment options.  He will return early next week for early morning testosterone levels to verify.  Check CBC and CMET without blood draw as well.  He is interested in testosterone replacement.  Discussed potential side effects including VTE, heart attack, stroke, prostate cancer, and testicular cancer.  Patient voiced understanding.  If repeat testing is low, they would like to start topical therapy with AndroGel.  We will have him come back in 3 months after starting replacement for repeat testosterone level.

## 2018-08-08 ENCOUNTER — Other Ambulatory Visit (INDEPENDENT_AMBULATORY_CARE_PROVIDER_SITE_OTHER): Payer: BLUE CROSS/BLUE SHIELD

## 2018-08-08 DIAGNOSIS — R7989 Other specified abnormal findings of blood chemistry: Secondary | ICD-10-CM

## 2018-08-08 LAB — TESTOSTERONE: Testosterone: 252.15 ng/dL — ABNORMAL LOW (ref 300.00–890.00)

## 2018-08-08 LAB — CBC
HCT: 46.9 % (ref 39.0–52.0)
Hemoglobin: 16 g/dL (ref 13.0–17.0)
MCHC: 34.2 g/dL (ref 30.0–36.0)
MCV: 90.8 fl (ref 78.0–100.0)
Platelets: 206 10*3/uL (ref 150.0–400.0)
RBC: 5.16 Mil/uL (ref 4.22–5.81)
RDW: 13.6 % (ref 11.5–15.5)
WBC: 5.4 10*3/uL (ref 4.0–10.5)

## 2018-08-08 LAB — COMPREHENSIVE METABOLIC PANEL
ALT: 43 U/L (ref 0–53)
AST: 25 U/L (ref 0–37)
Albumin: 4.3 g/dL (ref 3.5–5.2)
Alkaline Phosphatase: 61 U/L (ref 39–117)
BUN: 22 mg/dL (ref 6–23)
CO2: 24 mEq/L (ref 19–32)
Calcium: 9.3 mg/dL (ref 8.4–10.5)
Chloride: 103 mEq/L (ref 96–112)
Creatinine, Ser: 1.13 mg/dL (ref 0.40–1.50)
GFR: 72.76 mL/min (ref >60.00)
Glucose, Bld: 110 mg/dL — ABNORMAL HIGH (ref 70–99)
Potassium: 4.1 mEq/L (ref 3.5–5.1)
Sodium: 136 mEq/L (ref 135–145)
TOTAL PROTEIN: 6.3 g/dL (ref 6.0–8.3)
Total Bilirubin: 0.5 mg/dL (ref 0.2–1.2)

## 2018-08-12 ENCOUNTER — Other Ambulatory Visit: Payer: Self-pay | Admitting: Family Medicine

## 2018-08-12 ENCOUNTER — Telehealth: Payer: Self-pay

## 2018-08-12 MED ORDER — TESTOSTERONE 20.25 MG/ACT (1.62%) TD GEL: TRANSDERMAL | 0 refills | Status: DC

## 2018-08-12 NOTE — Progress Notes (Signed)
Please inform patient of the following:  His testosterone level was confirmed to be low. I will send in a prescription for the topical gel as we discussed. Would like for him to come back in 3 months to recheck levels and for follow up visit.  Algis Greenhouse. [name], MD 08/12/2018 7:50 AM

## 2018-08-12 NOTE — Telephone Encounter (Signed)
Testosterone PA approved through 08/12/2019.  Patient's pharmacy notified.

## 2018-08-22 DIAGNOSIS — G4733 Obstructive sleep apnea (adult) (pediatric): Secondary | ICD-10-CM | POA: Diagnosis not present

## 2018-09-02 DIAGNOSIS — F411 Generalized anxiety disorder: Secondary | ICD-10-CM | POA: Diagnosis not present

## 2018-09-02 DIAGNOSIS — F3342 Major depressive disorder, recurrent, in full remission: Secondary | ICD-10-CM | POA: Diagnosis not present

## 2018-09-05 ENCOUNTER — Ambulatory Visit: Payer: BLUE CROSS/BLUE SHIELD | Admitting: Family Medicine

## 2018-09-08 ENCOUNTER — Encounter: Payer: Self-pay | Admitting: Family Medicine

## 2018-09-18 ENCOUNTER — Ambulatory Visit (INDEPENDENT_AMBULATORY_CARE_PROVIDER_SITE_OTHER): Payer: BLUE CROSS/BLUE SHIELD | Admitting: Family Medicine

## 2018-09-18 ENCOUNTER — Encounter: Payer: Self-pay | Admitting: Family Medicine

## 2018-09-18 VITALS — BP 118/74 | HR 51 | Temp 98.4°F | Ht 71.0 in | Wt 229.2 lb

## 2018-09-18 DIAGNOSIS — R7989 Other specified abnormal findings of blood chemistry: Secondary | ICD-10-CM | POA: Diagnosis not present

## 2018-09-18 DIAGNOSIS — F331 Major depressive disorder, recurrent, moderate: Secondary | ICD-10-CM | POA: Diagnosis not present

## 2018-09-18 DIAGNOSIS — L821 Other seborrheic keratosis: Secondary | ICD-10-CM

## 2018-09-18 DIAGNOSIS — F411 Generalized anxiety disorder: Secondary | ICD-10-CM | POA: Diagnosis not present

## 2018-09-18 DIAGNOSIS — Z1211 Encounter for screening for malignant neoplasm of colon: Secondary | ICD-10-CM

## 2018-09-18 NOTE — Assessment & Plan Note (Signed)
Lesion on left temple consistent with seborrheic keratosis.  Offered cryotherapy, however patient declined.  Continue with watchful waiting.

## 2018-09-18 NOTE — Assessment & Plan Note (Signed)
Tolerating topical replacement well without side effects.  He will come back in about 6 weeks to recheck testosterone levels.

## 2018-09-18 NOTE — Patient Instructions (Signed)
It was very nice to see you today!  The spot on your left temple is a benign lesion called a seborrheic keratosis.  We can freeze the area if you would like to have it removed.  Please come back in about 6 weeks to recheck your testosterone and vitamin D levels.  I will place a referral for you to see a GI doctor to have your colon cancer screening performed.  Take care, Dr [name]

## 2018-09-18 NOTE — Assessment & Plan Note (Signed)
Stable.  Continue management per psychiatry. 

## 2018-09-18 NOTE — Assessment & Plan Note (Signed)
Continue management per psychiatry. 

## 2018-09-18 NOTE — Assessment & Plan Note (Signed)
Continue 5000 international units daily.  Check vitamin D level again in 6 weeks.

## 2018-09-18 NOTE — Progress Notes (Signed)
   Chief Complaint:  Ethan Osborn is a 53 y.o. male who presents for same day appointment with a chief complaint of rash.   Assessment/Plan:  Seborrheic keratoses Lesion on left temple consistent with seborrheic keratosis.  Offered cryotherapy, however patient declined.  Continue with watchful waiting.  Moderate recurrent major depression ([place]) Continue management per psychiatry.  Low vitamin D level Continue 5000 international units daily.  Check vitamin D level again in 6 weeks.  Low testosterone Tolerating topical replacement well without side effects.  He will come back in about 6 weeks to recheck testosterone levels.  GAD (generalized anxiety disorder) Stable.  Continue management per psychiatry.      Subjective:  HPI:  Rash, acute problem Started several months ago.  Located on left temple.  Has increased in size a little bit since then.  No pain.  No irritation.  No obvious precipitating events.  No obvious alleviating or aggravating factors.   His  chronic medical conditions are outlined below:  # Low Testosterone -On topical testosterone replacement.  Tolerating well without side effects. -ROS: No reported chest pain or shortness of breath. - Currnelt y topical  # Vitamin D Deficiency -Currently on vitamin D 5,000 IU daily  # Depression / Anxiety - Follows with Psychiatry -On Trintellix 10 mg daily and tolerating well.  ROS: Per HPI  PMH: He reports that he has never smoked. He has never used smokeless tobacco. He reports current alcohol use. He reports that he does not use drugs.      Objective:  Physical Exam: BP 118/74 (BP Location: Left Arm, Patient Position: Sitting, Cuff Size: Normal)   Pulse (!) 51   Temp 98.4 F (36.9 C) (Oral)   Ht 5\' 11"  (1.803 m)   Wt 229 lb 3.2 oz (104 kg)   SpO2 97%   BMI 31.97 kg/m   Gen: NAD, resting comfortably CV: Regular rate and rhythm with no murmurs appreciated Pulm: Normal work of breathing, clear to  auscultation bilaterally with no crackles, wheezes, or rhonchi Skin: Waxy 3 mm keratotic lesion on left temple no surrounding erythema.     Algis Greenhouse. [name], MD 09/18/2018 11:06 AM

## 2018-09-19 ENCOUNTER — Encounter: Payer: Self-pay | Admitting: Gastroenterology

## 2018-09-29 ENCOUNTER — Other Ambulatory Visit: Payer: Self-pay | Admitting: Family Medicine

## 2018-10-01 MED ORDER — TESTOSTERONE 20.25 MG/ACT (1.62%) TD GEL: TRANSDERMAL | 0 refills | Status: DC

## 2018-10-02 ENCOUNTER — Encounter: Payer: Self-pay | Admitting: Gastroenterology

## 2018-10-02 ENCOUNTER — Ambulatory Visit (AMBULATORY_SURGERY_CENTER): Payer: Self-pay | Admitting: *Deleted

## 2018-10-02 VITALS — Ht 71.0 in | Wt 229.0 lb

## 2018-10-02 DIAGNOSIS — Z1211 Encounter for screening for malignant neoplasm of colon: Secondary | ICD-10-CM

## 2018-10-02 MED ORDER — NA SULFATE-K SULFATE-MG SULF 17.5-3.13-1.6 GM/177ML PO SOLN: 1.0000 | Freq: Once | ORAL | 0 refills | Status: AC

## 2018-10-02 NOTE — Progress Notes (Signed)
No egg or soy allergy known to patient  No issues with past sedation with any surgeries  or procedures, no intubation problems  No diet pills per patient No home 02 use per patient  No blood thinners per patient  Pt denies issues with constipation  No A fib or A flutter  EMMI video sent to pt's e mail  Suprep $15 coupon to pt- wife in [place] with pt today

## 2018-10-14 ENCOUNTER — Other Ambulatory Visit (INDEPENDENT_AMBULATORY_CARE_PROVIDER_SITE_OTHER): Payer: BLUE CROSS/BLUE SHIELD

## 2018-10-14 DIAGNOSIS — R7989 Other specified abnormal findings of blood chemistry: Secondary | ICD-10-CM | POA: Diagnosis not present

## 2018-10-14 LAB — TESTOSTERONE: TESTOSTERONE: 190.76 ng/dL — AB (ref 300.00–890.00)

## 2018-10-14 LAB — VITAMIN D 25 HYDROXY (VIT D DEFICIENCY, FRACTURES): VITD: 37.96 ng/mL (ref 30.00–100.00)

## 2018-10-15 ENCOUNTER — Encounter: Payer: Self-pay | Admitting: Gastroenterology

## 2018-10-15 ENCOUNTER — Other Ambulatory Visit: Payer: Self-pay

## 2018-10-15 ENCOUNTER — Ambulatory Visit (AMBULATORY_SURGERY_CENTER): Payer: BLUE CROSS/BLUE SHIELD | Admitting: Gastroenterology

## 2018-10-15 VITALS — BP 127/70 | HR 51 | Temp 96.6°F | Resp 7 | Ht 71.0 in | Wt 229.0 lb

## 2018-10-15 DIAGNOSIS — D124 Benign neoplasm of descending colon: Secondary | ICD-10-CM

## 2018-10-15 DIAGNOSIS — D120 Benign neoplasm of cecum: Secondary | ICD-10-CM | POA: Diagnosis not present

## 2018-10-15 DIAGNOSIS — Z1211 Encounter for screening for malignant neoplasm of colon: Secondary | ICD-10-CM

## 2018-10-15 DIAGNOSIS — D122 Benign neoplasm of ascending colon: Secondary | ICD-10-CM

## 2018-10-15 MED ORDER — SODIUM CHLORIDE 0.9 % IV SOLN: 500.0000 mL | Freq: Once | INTRAVENOUS | Status: DC

## 2018-10-15 NOTE — Patient Instructions (Signed)
  Thank you for allowing [place] to care for you today!  Await pathology results by mail approximately 2 weeks.  Recommendation for next colonoscopy will be made at that time.  Resume previous diet and medications today.  Return to normal activities tomorrow  Hemorrhoids Diverticulosis, and polyp handouts given.      YOU HAD AN ENDOSCOPIC PROCEDURE TODAY AT [facility]:   Refer to the procedure report that was given to you for any specific questions about what was found during the examination.  If the procedure report does not answer your questions, please call your gastroenterologist to clarify.  If you requested that your care partner not be given the details of your procedure findings, then the procedure report has been included in a sealed envelope for you to review at your convenience later.  YOU SHOULD EXPECT: Some feelings of bloating in the abdomen. Passage of more gas than usual.  Walking can help get rid of the air that was put into your GI tract during the procedure and reduce the bloating. If you had a lower endoscopy (such as a colonoscopy or flexible sigmoidoscopy) you may notice spotting of blood in your stool or on the toilet paper. If you underwent a bowel prep for your procedure, you may not have a normal bowel movement for a few days.  Please Note:  You might notice some irritation and congestion in your nose or some drainage.  This is from the oxygen used during your procedure.  There is no need for concern and it should clear up in a day or so.  SYMPTOMS TO REPORT IMMEDIATELY:   Following lower endoscopy (colonoscopy or flexible sigmoidoscopy):  Excessive amounts of blood in the stool  Significant tenderness or worsening of abdominal pains  Swelling of the abdomen that is new, acute  Fever of 100F or higher   For urgent or emergent issues, a gastroenterologist can be reached at any hour by calling [phone].   DIET:  We do recommend a small meal  at first, but then you may proceed to your regular diet.  Drink plenty of fluids but you should avoid alcoholic beverages for 24 hours.  ACTIVITY:  You should plan to take it easy for the rest of today and you should NOT DRIVE or use heavy machinery until tomorrow (because of the sedation medicines used during the test).    FOLLOW UP: Our staff will call the number listed on your records the next business day following your procedure to check on you and address any questions or concerns that you may have regarding the information given to you following your procedure. If we do not reach you, we will leave a message.  However, if you are feeling well and you are not experiencing any problems, there is no need to return our call.  We will assume that you have returned to your regular daily activities without incident.  If any biopsies were taken you will be contacted by phone or by letter within the next 1-3 weeks.  Please call us at [phone] if you have not heard about the biopsies in 3 weeks.    SIGNATURES/CONFIDENTIALITY: You and/or your care partner have signed paperwork which will be entered into your electronic medical record.  These signatures attest to the fact that that the information above on your After Visit Summary has been reviewed and is understood.  Full responsibility of the confidentiality of this discharge information lies with you and/or your care-partner.

## 2018-10-15 NOTE — Progress Notes (Signed)
Called to room to assist during endoscopic procedure.  Patient ID and intended procedure confirmed with present staff. Received instructions for my participation in the procedure from the performing physician.  

## 2018-10-15 NOTE — Op Note (Signed)
[facility] Patient Name: Ethan Osborn Procedure Date: 10/15/2018 9:31 AM MRN: [id number] Endoscopist: Remo Lipps P. Havery Moros , MD Age: 53 Referring MD:  Date of Birth: 1966-02-16 Gender: Male Account #: [id number] Procedure:                Colonoscopy Indications:              Screening for colorectal malignant neoplasm, This                            is the patient's first colonoscopy Medicines:                Monitored Anesthesia Care Procedure:                Pre-Anesthesia Assessment:                           - Prior to the procedure, a History and Physical                            was performed, and patient medications and                            allergies were reviewed. The patient's tolerance of                            previous anesthesia was also reviewed. The risks                            and benefits of the procedure and the sedation                            options and risks were discussed with the patient.                            All questions were answered, and informed consent                            was obtained. Prior Anticoagulants: The patient has                            taken no previous anticoagulant or antiplatelet                            agents. ASA Grade Assessment: II - A patient with                            mild systemic disease. After reviewing the risks                            and benefits, the patient was deemed in                            satisfactory condition to undergo the procedure.  After obtaining informed consent, the colonoscope                            was passed under direct vision. Throughout the                            procedure, the patient's blood pressure, pulse, and                            oxygen saturations were monitored continuously. The                            Colonoscope was introduced through the anus and                            advanced to the the  cecum, identified by                            appendiceal orifice and ileocecal valve. The                            colonoscopy was performed without difficulty. The                            patient tolerated the procedure well. The quality                            of the bowel preparation was good. The ileocecal                            valve, appendiceal orifice, and rectum were                            photographed. Scope In: 9:34:28 AM Scope Out: 9:51:13 AM Scope Withdrawal Time: 0 hours 12 minutes 58 seconds  Total Procedure Duration: 0 hours 16 minutes 45 seconds  Findings:                 The perianal and digital rectal examinations were                            normal.                           A 4 mm polyp was found in the cecum. The polyp was                            sessile. The polyp was removed with a cold snare.                            Resection and retrieval were complete.                           A 3 mm polyp was found in the ascending colon. The  polyp was sessile. The polyp was removed with a                            cold snare. Resection and retrieval were complete.                           A 5 mm polyp was found in the descending colon. The                            polyp was sessile. The polyp was removed with a                            cold snare. Resection and retrieval were complete.                           Scattered small-mouthed diverticula were found in                            the ascending colon, cecum and left colon.                           Internal hemorrhoids were found during retroflexion.                           The exam was otherwise without abnormality. Complications:            No immediate complications. Estimated blood loss:                            Minimal. Estimated Blood Loss:     Estimated blood loss was minimal. Impression:               - One 4 mm polyp in the cecum, removed with a  cold                            snare. Resected and retrieved.                           - One 3 mm polyp in the ascending colon, removed                            with a cold snare. Resected and retrieved.                           - One 5 mm polyp in the descending colon, removed                            with a cold snare. Resected and retrieved.                           - Diverticulosis in the ascending colon, in the                            cecum and  in the left colon.                           - Internal hemorrhoids.                           - The examination was otherwise normal. Recommendation:           - Patient has a contact number available for                            emergencies. The signs and symptoms of potential                            delayed complications were discussed with the                            patient. Return to normal activities tomorrow.                            Written discharge instructions were provided to the                            patient.                           - Resume previous diet.                           - Continue present medications.                           - Await pathology results. Remo Lipps P. Armbruster, MD 10/15/2018 9:55:36 AM This report has been signed electronically.

## 2018-10-15 NOTE — Progress Notes (Signed)
Please inform patient of the following:  His vitamin D level is back to normal. Would like for him to continue oral replacement as he has been.  His testosterone is still low. Would like for him to increase his testosterone to 3 pumps daily and we can recheck in 3 months.

## 2018-10-15 NOTE — Progress Notes (Signed)
PT taken to PACU. Monitors in place. VSS. Report given to RN. 

## 2018-10-16 ENCOUNTER — Telehealth: Payer: Self-pay

## 2018-10-16 NOTE — Telephone Encounter (Signed)
  Follow up Call-  Call back number 10/15/2018  Post procedure Call Back phone  # [phone]  Permission to leave phone message Yes  Some recent data might be hidden     Patient questions:  Do you have a fever, pain , or abdominal swelling? No. Pain Score  0 *  Have you tolerated food without any problems? Yes.    Have you been able to return to your normal activities? Yes.    Do you have any questions about your discharge instructions: Diet   No. Medications  No. Follow up visit  No.  Do you have questions or concerns about your Care? No.  Actions: * If pain score is 4 or above: No action needed, pain <4.

## 2018-10-28 DIAGNOSIS — H40003 Preglaucoma, unspecified, bilateral: Secondary | ICD-10-CM | POA: Diagnosis not present

## 2018-11-01 ENCOUNTER — Other Ambulatory Visit: Payer: Self-pay | Admitting: Family Medicine

## 2018-11-03 NOTE — Telephone Encounter (Signed)
Please advise 

## 2018-11-10 DIAGNOSIS — F3342 Major depressive disorder, recurrent, in full remission: Secondary | ICD-10-CM | POA: Diagnosis not present

## 2018-12-06 ENCOUNTER — Encounter: Payer: Self-pay | Admitting: Family Medicine

## 2018-12-08 ENCOUNTER — Other Ambulatory Visit: Payer: Self-pay

## 2018-12-08 MED ORDER — TESTOSTERONE 20.25 MG/ACT (1.62%) TD GEL: TRANSDERMAL | 0 refills | Status: DC

## 2019-01-17 ENCOUNTER — Other Ambulatory Visit: Payer: Self-pay | Admitting: Family Medicine

## 2019-01-20 ENCOUNTER — Other Ambulatory Visit: Payer: Self-pay | Admitting: Family Medicine

## 2019-01-20 NOTE — Telephone Encounter (Signed)
Rx request 

## 2019-01-21 ENCOUNTER — Other Ambulatory Visit: Payer: Self-pay | Admitting: Family Medicine

## 2019-01-21 NOTE — Telephone Encounter (Signed)
Please advise 

## 2019-01-22 MED ORDER — TESTOSTERONE 20.25 MG/ACT (1.62%) TD GEL: TRANSDERMAL | 0 refills | Status: DC

## 2019-01-22 NOTE — Telephone Encounter (Signed)
Please ask pt to come in to have testosterone level rechecked soon.  Ethan Osborn. [name], MD 01/22/2019 8:07 AM

## 2019-02-23 ENCOUNTER — Other Ambulatory Visit: Payer: Self-pay | Admitting: Family Medicine

## 2019-02-23 MED ORDER — TESTOSTERONE 20.25 MG/ACT (1.62%) TD GEL: TRANSDERMAL | 0 refills | Status: DC

## 2019-02-23 NOTE — Telephone Encounter (Signed)
Rx sent in. Please ask pt to come in for office visit and labs soon.  Algis Greenhouse. [name], MD 02/23/2019 4:40 PM

## 2019-02-24 DIAGNOSIS — F332 Major depressive disorder, recurrent severe without psychotic features: Secondary | ICD-10-CM | POA: Diagnosis not present

## 2019-02-24 DIAGNOSIS — F411 Generalized anxiety disorder: Secondary | ICD-10-CM | POA: Diagnosis not present

## 2019-03-17 ENCOUNTER — Other Ambulatory Visit: Payer: Self-pay | Admitting: Family Medicine

## 2019-03-18 MED ORDER — TESTOSTERONE 20.25 MG/ACT (1.62%) TD GEL: TRANSDERMAL | 0 refills | Status: DC

## 2019-03-18 NOTE — Telephone Encounter (Signed)
Rx sent in. Please ask pt to come in to check testosterone levels. Please place future order.  Algis Greenhouse. [name], MD 03/18/2019 11:01 AM

## 2019-03-18 NOTE — Telephone Encounter (Signed)
Rx Request 

## 2019-03-19 ENCOUNTER — Encounter: Payer: Self-pay | Admitting: Family Medicine

## 2019-03-19 ENCOUNTER — Other Ambulatory Visit: Payer: Self-pay

## 2019-03-19 ENCOUNTER — Ambulatory Visit (INDEPENDENT_AMBULATORY_CARE_PROVIDER_SITE_OTHER): Payer: BC Managed Care – PPO | Admitting: Family Medicine

## 2019-03-19 VITALS — Temp 101.2°F

## 2019-03-19 DIAGNOSIS — R05 Cough: Secondary | ICD-10-CM

## 2019-03-19 DIAGNOSIS — R5383 Other fatigue: Secondary | ICD-10-CM | POA: Diagnosis not present

## 2019-03-19 DIAGNOSIS — R509 Fever, unspecified: Secondary | ICD-10-CM

## 2019-03-19 DIAGNOSIS — R0602 Shortness of breath: Secondary | ICD-10-CM

## 2019-03-19 DIAGNOSIS — Z20822 Contact with and (suspected) exposure to covid-19: Secondary | ICD-10-CM

## 2019-03-19 DIAGNOSIS — R51 Headache: Secondary | ICD-10-CM | POA: Diagnosis not present

## 2019-03-19 NOTE — Patient Instructions (Signed)
We have put in an order for you to be tested for COVID-19.  You do not need an appointment to go get testing -- please proceed to one of the following drive-up testing locations at your convenience. The line closes at 3:30pm daily. Test results may take up to a week to return.  [facility] Location: [address], [place] (old [facility]) Hours: 8a-3:45p, M-F  [facility] Location: [address], [place], [place] [address] [facility]) Hours: 8a-3:45p, M-F  [facility] Location: [facility] (across from [facility] Emergency [facility]/[place]) Hours: 8a-3:45p, M-F     Person Under Monitoring Name: Ethan Osborn  Location: [place] [address]   Infection Prevention Recommendations for Individuals Confirmed to have, or Being Evaluated for, 2019 Novel Coronavirus (COVID-19) Infection Who Receive Care at Home  Individuals who are confirmed to have, or are being evaluated for, COVID-19 should follow the prevention steps below until a healthcare provider or local or state health department says they can return to normal activities.  Stay home except to get medical care You should restrict activities outside your home, except for getting medical care. Do not go to work, school, or public areas, and do not use public transportation or taxis.  Call ahead before visiting your doctor Before your medical appointment, call the healthcare provider and tell them that you have, or are being evaluated for, COVID-19 infection. This will help the healthcare provider's office take steps to keep other people from getting infected. Ask your healthcare provider to call the local or state health department.  Monitor your symptoms Seek prompt medical attention if your illness is worsening (e.g., difficulty breathing). Before going to your medical appointment, call the  healthcare provider and tell them that you have, or are being evaluated for, COVID-19 infection. Ask your healthcare provider to call the local or state health department.  Wear a facemask You should wear a facemask that covers your nose and mouth when you are in the same room with other people and when you visit a healthcare provider. People who live with or visit you should also wear a facemask while they are in the same room with you.  Separate yourself from other people in your home As much as possible, you should stay in a different room from other people in your home. Also, you should use a separate bathroom, if available.  Avoid sharing household items You should not share dishes, drinking glasses, cups, eating utensils, towels, bedding, or other items with other people in your home. After using these items, you should wash them thoroughly with soap and water.  Cover your coughs and sneezes Cover your mouth and nose with a tissue when you cough or sneeze, or you can cough or sneeze into your sleeve. Throw used tissues in a lined trash can, and immediately wash your hands with soap and water for at least 20 seconds or use an alcohol-based hand rub.  Wash your [facility] your hands often and thoroughly with soap and water for at least 20 seconds. You can use an alcohol-based hand sanitizer if soap and water are not available and if your hands are not visibly dirty. Avoid touching your eyes, nose, and mouth with unwashed hands.   Prevention Steps for Caregivers and Household Members of Individuals Confirmed to have, or Being Evaluated for, COVID-19 Infection Being Cared for in the Home  If you live with, or provide care  at home for, a person confirmed to have, or being evaluated for, COVID-19 infection please follow these guidelines to prevent infection:  Follow healthcare provider's instructions Make sure that you understand and can help the patient follow any healthcare  provider instructions for all care.  Provide for the patient's basic needs You should help the patient with basic needs in the home and provide support for getting groceries, prescriptions, and other personal needs.  Monitor the patient's symptoms If they are getting sicker, call his or her medical provider and tell them that the patient has, or is being evaluated for, COVID-19 infection. This will help the healthcare provider's office take steps to keep other people from getting infected. Ask the healthcare provider to call the local or state health department.  Limit the number of people who have contact with the patient  If possible, have only one caregiver for the patient.  Other household members should stay in another home or place of residence. If this is not possible, they should stay  in another room, or be separated from the patient as much as possible. Use a separate bathroom, if available.  Restrict visitors who do not have an essential need to be in the home.  Keep older adults, very young children, and other sick people away from the patient Keep older adults, very young children, and those who have compromised immune systems or chronic health conditions away from the patient. This includes people with chronic heart, lung, or kidney conditions, diabetes, and cancer.  Ensure good ventilation Make sure that shared spaces in the home have good air flow, such as from an air conditioner or an opened window, weather permitting.  Wash your hands often  Wash your hands often and thoroughly with soap and water for at least 20 seconds. You can use an alcohol based hand sanitizer if soap and water are not available and if your hands are not visibly dirty.  Avoid touching your eyes, nose, and mouth with unwashed hands.  Use disposable paper towels to dry your hands. If not available, use dedicated cloth towels and replace them when they become wet.  Wear a facemask and gloves   Wear a disposable facemask at all times in the room and gloves when you touch or have contact with the patient's blood, body fluids, and/or secretions or excretions, such as sweat, saliva, sputum, nasal mucus, vomit, urine, or feces.  Ensure the mask fits over your nose and mouth tightly, and do not touch it during use.  Throw out disposable facemasks and gloves after using them. Do not reuse.  Wash your hands immediately after removing your facemask and gloves.  If your personal clothing becomes contaminated, carefully remove clothing and launder. Wash your hands after handling contaminated clothing.  Place all used disposable facemasks, gloves, and other waste in a lined container before disposing them with other household waste.  Remove gloves and wash your hands immediately after handling these items.  Do not share dishes, glasses, or other household items with the patient  Avoid sharing household items. You should not share dishes, drinking glasses, cups, eating utensils, towels, bedding, or other items with a patient who is confirmed to have, or being evaluated for, COVID-19 infection.  After the person uses these items, you should wash them thoroughly with soap and water.  Wash laundry thoroughly  Immediately remove and wash clothes or bedding that have blood, body fluids, and/or secretions or excretions, such as sweat, saliva, sputum, nasal mucus, vomit, urine, or  feces, on them.  Wear gloves when handling laundry from the patient.  Read and follow directions on labels of laundry or clothing items and detergent. In general, wash and dry with the warmest temperatures recommended on the label.  Clean all areas the individual has used often  Clean all touchable surfaces, such as counters, tabletops, doorknobs, bathroom fixtures, toilets, phones, keyboards, tablets, and bedside tables, every day. Also, clean any surfaces that may have blood, body fluids, and/or secretions or  excretions on them.  Wear gloves when cleaning surfaces the patient has come in contact with.  Use a diluted bleach solution (e.g., dilute bleach with 1 part bleach and 10 parts water) or a household disinfectant with a label that says EPA-registered for coronaviruses. To make a bleach solution at home, add 1 tablespoon of bleach to 1 quart (4 cups) of water. For a larger supply, add  cup of bleach to 1 gallon (16 cups) of water.  Read labels of cleaning products and follow recommendations provided on product labels. Labels contain instructions for safe and effective use of the cleaning product including precautions you should take when applying the product, such as wearing gloves or eye protection and making sure you have good ventilation during use of the product.  Remove gloves and wash hands immediately after cleaning.  Monitor yourself for signs and symptoms of illness Caregivers and household members are considered close contacts, should monitor their health, and will be asked to limit movement outside of the home to the extent possible. Follow the monitoring steps for close contacts listed on the symptom monitoring form.   ? If you have additional questions, contact your local health department or call the epidemiologist on call at [phone] (available 24/7). ? This guidance is subject to change. For the most up-to-date guidance from [facility], please refer to their website: [email or web address]

## 2019-03-19 NOTE — Progress Notes (Signed)
Virtual Visit via Video Note  Subjective  CC:  Chief Complaint  Patient presents with  . Possible COVID    Reports SOB, cough, HA on and off, fatigue, and low grade fever.. Symptoms started about 03/17/19.[name] Kitchen He has traveled recently to [place]. He has been taking Mucinex cold/flu     I connected with Myrene Galas on 03/19/19 at  4:20 PM EDT by a video enabled telemedicine application and verified that I am speaking with the correct person using two identifiers. Location patient: Home Location provider: [facility], Office Persons participating in the virtual visit: Myrene Galas, Leamon Arnt, MD Lilli Light, [place]  Same day acute visit; PCP not available. New pt to me. Chart reviewed.   I discussed the limitations of evaluation and management by telemedicine and the availability of in person appointments. The patient expressed understanding and agreed to proceed. HPI: Ethan Osborn is a 53 y.o. male who was contacted today to address the problems listed above in the chief complaint. . Pleasant 53 year old male without pulmonary disease complains of 2-day history of fevers to 101.2, mild shortness of breath, hacking cough that awakens him, chills and sweats, mild nausea without vomiting, diarrhea or abdominal pain and congestion.  He recently traveled from [place].  No known cold exposures.  He denies dyspnea on exertion or chest pain.  He is eating and drinking well. Assessment  1. Suspected 2019 Novel Coronavirus Infection      Plan   Suspected coronavirus infection: Symptoms most consistent with novel coronavirus infection.  Treat supportively.  Extensive education given on quarantining and home management strategies.  Patient to monitor his respiratory status closely, he lives with his wife we will also monitor him.  If shortness of breath worsens or increased work of breathing occurs, he will go to the emergency room for further evaluation and  care.  Otherwise he will use Tylenol for fevers, hydrate, and self quarantining the next 10 to 14 days.  He will get coronavirus testing at the drive out test site tomorrow.  See after visit summary for instructions.  He will follow-up here if symptoms are worsening or if he has questions.  If he test negative, and pulmonary symptoms persist, he will need further evaluation and possible treatment.  Patient stands and agrees with care plan. I discussed the assessment and treatment plan with the patient. The patient was provided an opportunity to ask questions and all were answered. The patient agreed with the plan and demonstrated an understanding of the instructions.   The patient was advised to call back or seek an in-person evaluation if the symptoms worsen or if the condition fails to improve as anticipated. Follow up: prn  Visit date not found  No orders of the defined types were placed in this encounter.     I reviewed the patients updated PMH, FH, and SocHx.    Patient Active Problem List   Diagnosis Date Noted  . Seborrheic keratoses 09/18/2018  . Low testosterone 08/04/2018  . Low vitamin D level 08/04/2018  . Actinic keratosis 07/01/2018  . Shoulder dislocation, right, initial encounter 03/10/2018  . Rash 07/18/2017  . Moderate recurrent major depression ([place]) 01/08/2017  . GAD (generalized anxiety disorder) 01/08/2017   Current Meds  Medication Sig  . cholecalciferol (VITAMIN D3) 25 MCG (1000 UT) tablet Take 1,000 Units by mouth daily.  [name] Kitchen REXULTI 1 MG TABS Take 1 tablet by mouth at bedtime.  . Testosterone 20.25  MG/ACT (1.62%) GEL PLACE 3 PUMPS TO UPPER ARM AND SHOULDER ONCE A DAY  . TRINTELLIX 20 MG TABS tablet TAKE 1 TABLET BY MOUTH EVERY DAY. DISCONTINUE WELLBUTRIN (BUPROPION)  . [DISCONTINUED] vortioxetine HBr (TRINTELLIX) 5 MG TABS tablet Take 10 mg by mouth daily.    Allergies: Patient is allergic to penicillins. Family History: Patient family history includes  Arthritis in his father; Cancer in his paternal grandfather and paternal grandmother; Colon polyps in his father and mother; Depression in his mother and sister; Diabetes in his father; Hearing loss in his father and mother; Heart disease in his father; Hyperlipidemia in his father; Kidney disease in his father; Stroke in his maternal grandfather. Social History:  Patient  reports that he has never smoked. He has never used smokeless tobacco. He reports current alcohol use. He reports that he does not use drugs.  Review of Systems: Constitutional: Positive for fever malaise or anorexia Cardiovascular: negative for chest pain Respiratory: negative for SOB or persistent cough Gastrointestinal: negative for abdominal pain  OBJECTIVE Vitals: Temp (!) 101.2 F (38.4 C) (Oral)  General: no acute distress , A&Ox3 Respiratory: Mild tachypnea, able to speak in clear sentences.  No retractions.  Leamon Arnt, MD

## 2019-03-20 ENCOUNTER — Other Ambulatory Visit: Payer: Self-pay

## 2019-03-20 DIAGNOSIS — Z20822 Contact with and (suspected) exposure to covid-19: Secondary | ICD-10-CM

## 2019-03-21 LAB — NOVEL CORONAVIRUS, NAA: SARS-CoV-2, NAA: NOT DETECTED

## 2019-04-10 ENCOUNTER — Other Ambulatory Visit: Payer: Self-pay | Admitting: Family Medicine

## 2019-04-13 NOTE — Telephone Encounter (Signed)
Rx request,last lab 10/14/18

## 2019-04-14 MED ORDER — TESTOSTERONE 20.25 MG/ACT (1.62%) TD GEL: TRANSDERMAL | 0 refills | Status: DC

## 2019-04-14 NOTE — Telephone Encounter (Signed)
Rx refilled. Please ask pt to come in to check levels ASAP.

## 2019-04-19 ENCOUNTER — Other Ambulatory Visit: Payer: Self-pay | Admitting: Family Medicine

## 2019-04-19 ENCOUNTER — Encounter: Payer: Self-pay | Admitting: Family Medicine

## 2019-04-21 ENCOUNTER — Other Ambulatory Visit: Payer: Self-pay

## 2019-04-21 DIAGNOSIS — R7989 Other specified abnormal findings of blood chemistry: Secondary | ICD-10-CM

## 2019-04-24 ENCOUNTER — Other Ambulatory Visit: Payer: Self-pay

## 2019-04-24 ENCOUNTER — Other Ambulatory Visit (INDEPENDENT_AMBULATORY_CARE_PROVIDER_SITE_OTHER): Payer: BC Managed Care – PPO

## 2019-04-24 DIAGNOSIS — R7989 Other specified abnormal findings of blood chemistry: Secondary | ICD-10-CM | POA: Diagnosis not present

## 2019-04-24 LAB — TESTOSTERONE: Testosterone: 450.41 ng/dL (ref 300.00–890.00)

## 2019-04-24 NOTE — Progress Notes (Signed)
Please inform patient of the following:  Testosterone levels are within the goal range. Would like for him to continue current dose and we can recheck with his next set of labs.   Ethan Osborn. [name], MD 04/24/2019 2:53 PM

## 2019-05-01 DIAGNOSIS — F331 Major depressive disorder, recurrent, moderate: Secondary | ICD-10-CM | POA: Diagnosis not present

## 2019-05-26 ENCOUNTER — Encounter: Payer: Self-pay | Admitting: Family Medicine

## 2019-05-27 MED ORDER — TESTOSTERONE 20.25 MG/ACT (1.62%) TD GEL: TRANSDERMAL | 5 refills | Status: DC

## 2019-05-27 NOTE — Telephone Encounter (Signed)
Please advise 

## 2019-06-05 ENCOUNTER — Encounter: Payer: Self-pay | Admitting: Family Medicine

## 2019-06-05 ENCOUNTER — Ambulatory Visit (INDEPENDENT_AMBULATORY_CARE_PROVIDER_SITE_OTHER): Payer: BC Managed Care – PPO | Admitting: *Deleted

## 2019-06-05 ENCOUNTER — Other Ambulatory Visit: Payer: Self-pay

## 2019-06-05 DIAGNOSIS — Z23 Encounter for immunization: Secondary | ICD-10-CM

## 2019-06-12 DIAGNOSIS — F332 Major depressive disorder, recurrent severe without psychotic features: Secondary | ICD-10-CM | POA: Diagnosis not present

## 2019-06-17 DIAGNOSIS — G4730 Sleep apnea, unspecified: Secondary | ICD-10-CM | POA: Insufficient documentation

## 2019-06-17 DIAGNOSIS — M1990 Unspecified osteoarthritis, unspecified site: Secondary | ICD-10-CM | POA: Insufficient documentation

## 2019-06-17 DIAGNOSIS — T7840XA Allergy, unspecified, initial encounter: Secondary | ICD-10-CM | POA: Insufficient documentation

## 2019-06-30 ENCOUNTER — Telehealth: Payer: Self-pay

## 2019-06-30 NOTE — Telephone Encounter (Signed)
Testosterone PA approved through 06/29/2020.

## 2019-07-03 ENCOUNTER — Encounter: Payer: Self-pay | Admitting: Family Medicine

## 2019-07-14 DIAGNOSIS — F332 Major depressive disorder, recurrent severe without psychotic features: Secondary | ICD-10-CM | POA: Diagnosis not present

## 2019-07-16 DIAGNOSIS — F332 Major depressive disorder, recurrent severe without psychotic features: Secondary | ICD-10-CM | POA: Diagnosis not present

## 2019-07-20 DIAGNOSIS — F332 Major depressive disorder, recurrent severe without psychotic features: Secondary | ICD-10-CM | POA: Diagnosis not present

## 2019-07-23 DIAGNOSIS — F332 Major depressive disorder, recurrent severe without psychotic features: Secondary | ICD-10-CM | POA: Diagnosis not present

## 2019-07-27 DIAGNOSIS — F332 Major depressive disorder, recurrent severe without psychotic features: Secondary | ICD-10-CM | POA: Diagnosis not present

## 2019-07-30 DIAGNOSIS — F332 Major depressive disorder, recurrent severe without psychotic features: Secondary | ICD-10-CM | POA: Diagnosis not present

## 2019-08-03 DIAGNOSIS — F332 Major depressive disorder, recurrent severe without psychotic features: Secondary | ICD-10-CM | POA: Diagnosis not present

## 2019-08-05 DIAGNOSIS — F332 Major depressive disorder, recurrent severe without psychotic features: Secondary | ICD-10-CM | POA: Diagnosis not present

## 2019-08-10 DIAGNOSIS — F332 Major depressive disorder, recurrent severe without psychotic features: Secondary | ICD-10-CM | POA: Diagnosis not present

## 2019-08-20 DIAGNOSIS — F332 Major depressive disorder, recurrent severe without psychotic features: Secondary | ICD-10-CM | POA: Diagnosis not present

## 2019-08-26 DIAGNOSIS — F332 Major depressive disorder, recurrent severe without psychotic features: Secondary | ICD-10-CM | POA: Diagnosis not present

## 2019-08-27 DIAGNOSIS — F3342 Major depressive disorder, recurrent, in full remission: Secondary | ICD-10-CM | POA: Diagnosis not present

## 2019-08-28 DIAGNOSIS — F331 Major depressive disorder, recurrent, moderate: Secondary | ICD-10-CM | POA: Diagnosis not present

## 2019-09-02 DIAGNOSIS — F332 Major depressive disorder, recurrent severe without psychotic features: Secondary | ICD-10-CM | POA: Diagnosis not present

## 2019-09-09 DIAGNOSIS — F331 Major depressive disorder, recurrent, moderate: Secondary | ICD-10-CM | POA: Diagnosis not present

## 2019-09-10 DIAGNOSIS — F332 Major depressive disorder, recurrent severe without psychotic features: Secondary | ICD-10-CM | POA: Diagnosis not present

## 2019-09-16 DIAGNOSIS — F332 Major depressive disorder, recurrent severe without psychotic features: Secondary | ICD-10-CM | POA: Diagnosis not present

## 2019-09-22 DIAGNOSIS — F332 Major depressive disorder, recurrent severe without psychotic features: Secondary | ICD-10-CM | POA: Diagnosis not present

## 2019-09-23 DIAGNOSIS — F331 Major depressive disorder, recurrent, moderate: Secondary | ICD-10-CM | POA: Diagnosis not present

## 2019-09-29 DIAGNOSIS — F332 Major depressive disorder, recurrent severe without psychotic features: Secondary | ICD-10-CM | POA: Diagnosis not present

## 2019-10-06 DIAGNOSIS — F332 Major depressive disorder, recurrent severe without psychotic features: Secondary | ICD-10-CM | POA: Diagnosis not present

## 2019-10-07 DIAGNOSIS — F331 Major depressive disorder, recurrent, moderate: Secondary | ICD-10-CM | POA: Diagnosis not present

## 2019-10-13 DIAGNOSIS — F331 Major depressive disorder, recurrent, moderate: Secondary | ICD-10-CM | POA: Diagnosis not present

## 2019-10-14 ENCOUNTER — Encounter: Payer: Self-pay | Admitting: Psychiatric/Mental Health

## 2019-10-14 ENCOUNTER — Observation Stay (HOSPITAL_COMMUNITY)
Admission: RE | Admit: 2019-10-14 | Discharge: 2019-10-14 | Disposition: A | Discharge status: Other Acute Inpatient Hospital | Payer: BC Managed Care – PPO | Attending: Psychiatry | Admitting: Psychiatry

## 2019-10-14 ENCOUNTER — Other Ambulatory Visit: Payer: Self-pay

## 2019-10-14 ENCOUNTER — Inpatient Hospital Stay
Admission: EM | Admit: 2019-10-14 | Discharge: 2019-10-19 | DRG: 885 | Disposition: A | Discharge status: Home / Self Care | Payer: BC Managed Care – PPO | Source: Intra-hospital | Attending: Psychiatry | Admitting: Psychiatry

## 2019-10-14 DIAGNOSIS — M19071 Primary osteoarthritis, right ankle and foot: Secondary | ICD-10-CM | POA: Diagnosis present

## 2019-10-14 DIAGNOSIS — Z880 Allergy status to penicillin: Secondary | ICD-10-CM | POA: Diagnosis not present

## 2019-10-14 DIAGNOSIS — G4700 Insomnia, unspecified: Secondary | ICD-10-CM | POA: Diagnosis present

## 2019-10-14 DIAGNOSIS — F332 Major depressive disorder, recurrent severe without psychotic features: Secondary | ICD-10-CM | POA: Diagnosis not present

## 2019-10-14 DIAGNOSIS — Z01818 Encounter for other preprocedural examination: Secondary | ICD-10-CM | POA: Diagnosis not present

## 2019-10-14 DIAGNOSIS — Z818 Family history of other mental and behavioral disorders: Secondary | ICD-10-CM | POA: Diagnosis not present

## 2019-10-14 DIAGNOSIS — Z20822 Contact with and (suspected) exposure to covid-19: Secondary | ICD-10-CM | POA: Insufficient documentation

## 2019-10-14 DIAGNOSIS — R45851 Suicidal ideations: Secondary | ICD-10-CM | POA: Insufficient documentation

## 2019-10-14 DIAGNOSIS — R410 Disorientation, unspecified: Secondary | ICD-10-CM | POA: Diagnosis not present

## 2019-10-14 DIAGNOSIS — G4730 Sleep apnea, unspecified: Secondary | ICD-10-CM | POA: Diagnosis present

## 2019-10-14 DIAGNOSIS — F331 Major depressive disorder, recurrent, moderate: Secondary | ICD-10-CM | POA: Diagnosis not present

## 2019-10-14 DIAGNOSIS — E785 Hyperlipidemia, unspecified: Secondary | ICD-10-CM | POA: Diagnosis present

## 2019-10-14 DIAGNOSIS — F419 Anxiety disorder, unspecified: Secondary | ICD-10-CM | POA: Diagnosis present

## 2019-10-14 DIAGNOSIS — F418 Other specified anxiety disorders: Secondary | ICD-10-CM | POA: Diagnosis not present

## 2019-10-14 DIAGNOSIS — Z79899 Other long term (current) drug therapy: Secondary | ICD-10-CM | POA: Insufficient documentation

## 2019-10-14 DIAGNOSIS — F329 Major depressive disorder, single episode, unspecified: Principal | ICD-10-CM | POA: Insufficient documentation

## 2019-10-14 LAB — RESPIRATORY PANEL BY RT PCR (FLU A&B, COVID)
Influenza A by PCR: NEGATIVE
Influenza B by PCR: NEGATIVE
SARS Coronavirus 2 by RT PCR: NEGATIVE

## 2019-10-14 MED ORDER — TRAZODONE HCL 50 MG PO TABS
50.0000 mg | ORAL_TABLET | Freq: Every evening | ORAL | Status: DC | PRN
  Administered 2019-10-15 – 2019-10-18 (×3): 50 mg via ORAL
  Filled 2019-10-14 (×3): qty 1

## 2019-10-14 MED ORDER — MAGNESIUM HYDROXIDE 400 MG/5ML PO SUSP: 30.0000 mL | Freq: Every day | ORAL | Status: DC | PRN

## 2019-10-14 MED ORDER — TESTOSTERONE 50 MG/5GM (1%) TD GEL: 4.0000 g | Freq: Every day | TRANSDERMAL | Status: DC

## 2019-10-14 MED ORDER — ALUM & MAG HYDROXIDE-SIMETH 200-200-20 MG/5ML PO SUSP: 30.0000 mL | ORAL | Status: DC | PRN

## 2019-10-14 MED ORDER — HYDROXYZINE HCL 25 MG PO TABS
25.0000 mg | ORAL_TABLET | Freq: Three times a day (TID) | ORAL | Status: DC | PRN
  Administered 2019-10-17: 25 mg via ORAL
  Filled 2019-10-14: qty 1

## 2019-10-14 MED ORDER — ACETAMINOPHEN 325 MG PO TABS
650.0000 mg | ORAL_TABLET | Freq: Four times a day (QID) | ORAL | Status: DC | PRN
  Administered 2019-10-19: 650 mg via ORAL
  Filled 2019-10-14: qty 2

## 2019-10-14 MED ORDER — OLANZAPINE-FLUOXETINE HCL 6-25 MG PO CAPS
1.0000 | ORAL_CAPSULE | Freq: Every day | ORAL | Status: DC
  Filled 2019-10-14 (×4): qty 1

## 2019-10-14 MED ORDER — LITHIUM CARBONATE 300 MG PO CAPS: 450.0000 mg | ORAL_CAPSULE | Freq: Every day | ORAL | Status: DC

## 2019-10-14 NOTE — Tx Team (Signed)
Initial Treatment Plan 10/14/2019 11:46 PM Ethan Osborn [id number]    PATIENT STRESSORS: Occupational concerns Substance abuse   PATIENT STRENGTHS: Average or above average intelligence Capable of independent living Communication skills General fund of knowledge   PATIENT IDENTIFIED PROBLEMS: Major Depressive disorder  Suicidal ideation                   DISCHARGE CRITERIA:  Improved stabilization in mood, thinking, and/or behavior  PRELIMINARY DISCHARGE PLAN: Outpatient therapy  PATIENT/FAMILY INVOLVEMENT: This treatment plan has been presented to and reviewed with the patient, Ethan Osborn, and/or family member, .  The patient and family have been given the opportunity to ask questions and make suggestions.  Floyde Parkins, RN 10/14/2019, 11:46 PM
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
## 2019-10-14 NOTE — Progress Notes (Signed)
Report called to [facility] accepted report.  Pending Safe Transport to [facility].
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
## 2019-10-14 NOTE — Progress Notes (Signed)
Report attempted to [facility], unit requests RN call back in 10 minutes.  Wife given update, COVID Neg, will continue to monitor.

## 2019-10-14 NOTE — BH Assessment (Signed)
Patient has been accepted to [facility].  Accepting physician is Dr. Algie Coffer.  Attending  Physician will be Dr. Weber Cooks.  Patient has been assigned to room 325, by [place].   Call report to [phone].  Representative/Transfer Coordinator is Dispensing optician Patient pre-admitted by [facility] Patient Access Levada Dy)  [facility] Staff Kasandra Knudsen S, TTS) made aware of acceptance.  Patient bed is available anytime after 8:30pm.

## 2019-10-15 DIAGNOSIS — F332 Major depressive disorder, recurrent severe without psychotic features: Secondary | ICD-10-CM | POA: Diagnosis not present

## 2019-10-15 DIAGNOSIS — G4730 Sleep apnea, unspecified: Secondary | ICD-10-CM | POA: Diagnosis not present

## 2019-10-15 DIAGNOSIS — Z01818 Encounter for other preprocedural examination: Secondary | ICD-10-CM | POA: Diagnosis not present

## 2019-10-15 DIAGNOSIS — F331 Major depressive disorder, recurrent, moderate: Secondary | ICD-10-CM | POA: Diagnosis not present

## 2019-10-15 DIAGNOSIS — F419 Anxiety disorder, unspecified: Secondary | ICD-10-CM | POA: Diagnosis not present

## 2019-10-15 DIAGNOSIS — F418 Other specified anxiety disorders: Secondary | ICD-10-CM | POA: Diagnosis not present

## 2019-10-15 LAB — COMPREHENSIVE METABOLIC PANEL
ALT: 55 U/L — ABNORMAL HIGH (ref 0–44)
AST: 32 U/L (ref 15–41)
Albumin: 4 g/dL (ref 3.5–5.0)
Alkaline Phosphatase: 52 U/L (ref 38–126)
Anion gap: 10 (ref 5–15)
BUN: 20 mg/dL (ref 6–20)
CO2: 24 mmol/L (ref 22–32)
Calcium: 9.3 mg/dL (ref 8.9–10.3)
Chloride: 105 mmol/L (ref 98–111)
Creatinine, Ser: 0.91 mg/dL (ref 0.61–1.24)
GFR calc Af Amer: >60 mL/min (ref >60)
GFR calc non Af Amer: >60 mL/min (ref >60)
Glucose, Bld: 191 mg/dL — ABNORMAL HIGH (ref 70–99)
Potassium: 4 mmol/L (ref 3.5–5.1)
Sodium: 139 mmol/L (ref 135–145)
Total Bilirubin: 1.1 mg/dL (ref 0.3–1.2)
Total Protein: 6.5 g/dL (ref 6.5–8.1)

## 2019-10-15 LAB — TSH: TSH: 3.883 u[IU]/mL (ref 0.350–4.500)

## 2019-10-15 LAB — CBC
HCT: 48.3 % (ref 39.0–52.0)
Hemoglobin: 16.8 g/dL (ref 13.0–17.0)
MCH: 31.2 pg (ref 26.0–34.0)
MCHC: 34.8 g/dL (ref 30.0–36.0)
MCV: 89.6 fL (ref 80.0–100.0)
Platelets: 193 10*3/uL (ref 150–400)
RBC: 5.39 MIL/uL (ref 4.22–5.81)
RDW: 12.7 % (ref 11.5–15.5)
WBC: 6.7 10*3/uL (ref 4.0–10.5)
nRBC: 0 % (ref 0.0–0.2)

## 2019-10-15 LAB — HEMOGLOBIN A1C
Hgb A1c MFr Bld: 7.5 % — ABNORMAL HIGH (ref 4.8–5.6)
Mean Plasma Glucose: 168.55 mg/dL

## 2019-10-15 LAB — LIPID PANEL
Cholesterol: 247 mg/dL — ABNORMAL HIGH (ref 0–200)
HDL: 34 mg/dL — ABNORMAL LOW (ref >40)
LDL Cholesterol: UNDETERMINED mg/dL (ref 0–99)
Total CHOL/HDL Ratio: 7.3 RATIO
Triglycerides: 448 mg/dL — ABNORMAL HIGH (ref <150)
VLDL: UNDETERMINED mg/dL (ref 0–40)

## 2019-10-15 LAB — LDL CHOLESTEROL, DIRECT: Direct LDL: 114.5 mg/dL — ABNORMAL HIGH (ref 0–99)

## 2019-10-15 LAB — ETHANOL: Alcohol, Ethyl (B): <10 mg/dL (ref <10)

## 2019-10-15 LAB — LITHIUM LEVEL: Lithium Lvl: <0.06 mmol/L — ABNORMAL LOW (ref 0.60–1.20)

## 2019-10-15 NOTE — BHH Suicide Risk Assessment (Signed)
[facility] Admission Suicide Risk Assessment   Nursing information obtained from:  Patient Demographic factors:  Male, Caucasian Current Mental Status:  Suicidal ideation indicated by patient Loss Factors:  NA Historical Factors:  NA Risk Reduction Factors:  Living with another person, especially a relative, Positive social support, Positive therapeutic relationship  Total Time spent with patient: 1 hour Principal Problem: MDD (major depressive disorder), recurrent episode, severe ([place]) Diagnosis:  Principal Problem:   MDD (major depressive disorder), recurrent episode, severe ([place])  Subjective Data: 54 year old man with a history of recurrent severe major depression.  Recent suicidal thoughts with thoughts about hanging himself or jumping from height.  Multiple symptoms of depression.  Patient is currently not intending to harm himself in the hospital and is lucid and agreeable to appropriate treatment  Continued Clinical Symptoms:  Alcohol Use Disorder Identification Test Final Score (AUDIT): 0 The "Alcohol Use Disorders Identification Test", Guidelines for Use in Primary Care, Second Edition.  World Pharmacologist [facility]). Score between 0-7:  no or low risk or alcohol related problems. Score between 8-15:  moderate risk of alcohol related problems. Score between 16-19:  high risk of alcohol related problems. Score 20 or above:  warrants further diagnostic evaluation for alcohol dependence and treatment.   CLINICAL FACTORS:   Depression:   Severe   Musculoskeletal: Strength & Muscle Tone: within normal limits Gait & Station: normal Patient leans: N/A  Psychiatric Specialty Exam: Physical Exam  Nursing note and vitals reviewed. Constitutional: He appears well-developed and well-nourished.  HENT:  Head: Normocephalic and atraumatic.  Eyes: Pupils are equal, round, and reactive to light. Conjunctivae are normal.  Cardiovascular: Regular rhythm and normal heart sounds.   Respiratory: Effort normal.  GI: Soft.  Musculoskeletal:        General: Normal range of motion.     Cervical back: Normal range of motion.  Neurological: He is alert.  Skin: Skin is warm and dry.  Psychiatric: Judgment normal. His speech is delayed. He is slowed. Cognition and memory are normal. He exhibits a depressed mood. He expresses suicidal ideation. He expresses suicidal plans.    Review of Systems  Constitutional: Negative.   HENT: Negative.   Eyes: Negative.   Respiratory: Negative.   Cardiovascular: Negative.   Gastrointestinal: Negative.   Musculoskeletal: Negative.   Skin: Negative.   Neurological: Negative.   Psychiatric/Behavioral: Positive for dysphoric mood, sleep disturbance and suicidal ideas.    Blood pressure 116/84, pulse 60, temperature 98.1 F (36.7 C), temperature source Oral, resp. rate 18, height 5\' 11"  (1.803 m), weight 105.5 kg, SpO2 96 %.Body mass index is 32.43 kg/m.  General Appearance: Casual  Eye Contact:  Good  Speech:  Clear and Coherent  Volume:  Normal  Mood:  Depressed  Affect:  Constricted  Thought Process:  Goal Directed  Orientation:  Full (Time, Place, and Person)  Thought Content:  Logical  Suicidal Thoughts:  Yes.  with intent/plan  Homicidal Thoughts:  No  Memory:  Immediate;   Fair Recent;   Fair Remote;   Fair  Judgement:  Fair  Insight:  Fair  Psychomotor Activity:  Normal  Concentration:  Concentration: Fair  Recall:  [employer] of Knowledge:  Fair  Language:  Fair  Akathisia:  No  Handed:  Right  AIMS (if indicated):     Assets:  Desire for Improvement Housing Physical Health Resilience Social Support  ADL's:  Intact  Cognition:  WNL  Sleep:  Number of Hours: 6.75  COGNITIVE FEATURES THAT CONTRIBUTE TO RISK:  None    SUICIDE RISK:   Moderate:  Frequent suicidal ideation with limited intensity, and duration, some specificity in terms of plans, no associated intent, good self-control, limited  dysphoria/symptomatology, some risk factors present, and identifiable protective factors, including available and accessible social support.  PLAN OF CARE: Continue 15-minute checks.  Engage in individual and group therapy and assessment.  Patient was seen by treatment team today.  The patient is agreeable to my suggestion of electroconvulsive therapy which we will be planning on starting tomorrow.  I certify that inpatient services furnished can reasonably be expected to improve the patient's condition.   Alethia Berthold, MD 10/15/2019, 12:44 PM

## 2019-10-15 NOTE — Progress Notes (Signed)
Recreation Therapy Notes  Date: 10/15/2019  Time: 9:30 am  Location: Craft Room  Behavioral response: Appropriate   Intervention Topic: Coping Skills   Discussion/Intervention:  Group content on today was focused on coping skills. The group defined what coping skills are and when they can be used. Individuals described how they normally cope with thing and the coping skills they normally use. Patients expressed why it is important to cope with things and how not coping with things can affect you. The group participated in the intervention "My coping box" and made coping boxes while adding coping skills they could use in the future to the box. Clinical Observations/Feedback:  Patient came to group and defined coping skills as getting through the day. Individual was social with peers and staff while participating in the intervention during group.  Haylie Mccutcheon LRT/CTRS         Koltan Portocarrero 10/15/2019 11:16 AM

## 2019-10-15 NOTE — BHH Suicide Risk Assessment (Signed)
[place] INPATIENT:  Family/Significant Other Suicide Prevention Education  Suicide Prevention Education:  Education Completed; Ethan Osborn, wife, [phone], has been identified by the patient as the family member/significant other with whom the patient will be residing, and identified as the person(s) who will aid the patient in the event of a mental health crisis (suicidal ideations/suicide attempt).  With written consent from the patient, the family member/significant other has been provided the following suicide prevention education, prior to the and/or following the discharge of the patient.  The suicide prevention education provided includes the following:  Suicide risk factors  Suicide prevention and interventions  National Suicide Hotline telephone number  [facility] assessment telephone number  [facility] Emergency Assistance [place] and/or Residential Mobile Crisis Unit telephone number  Request made of family/significant other to:  Remove weapons (e.g., guns, rifles, knives), all items previously/currently identified as safety concern.    Remove drugs/medications (over-the-counter, prescriptions, illicit drugs), all items previously/currently identified as a safety concern.  The family member/significant other verbalizes understanding of the suicide prevention education information provided.  The family member/significant other agrees to remove the items of safety concern listed above.  Wife reports that the patient has a "large support group".  She reports that the patient has a history on being on "mild antidepressants for anxiety and depression it runs in their family".  She reports that the patient was doing "fine" however, around the patient's 52th birthday the depression appeared to increase.  Wife reports that the patient "has made great progress" while working with Dr. Toy Care. She reports that the patient has recently had quite a few  interviews which has "added a tremendous amount of stress".  Wife reports that she does have concerns that the patient may harm himself, she reports "my husband is very methodical so if he has said that he plans on hurting himself then there is nothing left to do but to carry through with it".  Wife reports that all weapons have been removed from the home, including the shotgun that is under the bed, however, wife did report that she felt that if the patient wanted to get another weapon to harm himself, then he would.   Ethan Osborn 10/15/2019, 3:19 PM

## 2019-10-15 NOTE — Progress Notes (Signed)
Patient presents to unit with sad, flat affect. Endorses depression and SI with plan to hang self in shed, or jumping off of cliff. Denies HI, AVH. Reports increased depression, unable to state specific reason. States has been dealing with depression for a long time and now its increasing in intensity and frequency.Pt states has been sleeping for 18 hours a day. Lost job with [employer] but has a new one. Does not have any financial issues.  Encouragement and support offered. Safety checks maintained. Oriented patient to room and unit, Skin and contraband search completed and witnessed by [facility] MHT. No skin issues noted, no contraband found. Fluids and snack offered. Pt uses cpap at hs. Respiratory contacted and came to evaluate patients own machine. Pt remains safe on unit with q 15 min checks.

## 2019-10-15 NOTE — Progress Notes (Signed)
Recreation Therapy Notes  INPATIENT RECREATION THERAPY ASSESSMENT  Patient Details Name: Ethan Osborn MRN: [id number] DOB: 1966-02-10 Today's Date: 10/15/2019       Information Obtained From: Patient  Able to Participate in Assessment/Interview: Yes  Patient Presentation: Responsive  Reason for Admission (Per Patient): Active Symptoms, Suicidal Ideation  Patient Stressors:    Coping Skills:   Exercise, Other (Comment)(Grounding)  Leisure Interests (2+):  Exercise - Walking, Individual - Reading(Listen to pod cast)  Frequency of Recreation/Participation: Weekly  Awareness of Community Resources:  Yes  Community Resources:  Gym  Current Use:    If no, Barriers?:    Expressed Interest in [employer] Information:    [place] of Residence:  [place]  Patient Main Form of Transportation: Musician  Patient Strengths:  Considerate, Kind  Patient Identified Areas of Improvement:  Be more positive  Patient Goal for Hospitalization:  Getting through the week  Current SI (including self-harm):  No  Current HI:  No  Current AVH: No  Staff Intervention Plan: Group Attendance, Collaborate with Interdisciplinary Treatment Team  Consent to Intern Participation: N/A  Arlynn Stare 10/15/2019, 3:59 PM

## 2019-10-15 NOTE — Tx Team (Addendum)
Interdisciplinary Treatment and Diagnostic Plan Update  10/15/2019 Time of Session: 9:00AM Ethan Osborn MRN: [id number]  Principal Diagnosis: <principal problem not specified>  Secondary Diagnoses: Active Problems:   MDD (major depressive disorder), recurrent episode, severe (HCC)   Current Medications:  Current Facility-Administered Medications  Medication Dose Route Frequency Provider Last Rate Last Admin  . acetaminophen (TYLENOL) tablet 650 mg  650 mg Oral Q6H PRN Connye Burkitt, NP      . alum & mag hydroxide-simeth (MAALOX/MYLANTA) 200-200-20 MG/5ML suspension 30 mL  30 mL Oral Q4H PRN Connye Burkitt, NP      . hydrOXYzine (ATARAX/VISTARIL) tablet 25 mg  25 mg Oral TID PRN Connye Burkitt, NP      . lithium carbonate capsule 450 mg  450 mg Oral QHS Connye Burkitt, NP      . magnesium hydroxide (MILK OF MAGNESIA) suspension 30 mL  30 mL Oral Daily PRN Connye Burkitt, NP      . OLANZapine-FLUoxetine (SYMBYAX) 6-25 MG per capsule 1 capsule  1 capsule Oral QHS Connye Burkitt, NP      . testosterone (ANDROGEL) 50 MG/5GM (1%) gel 4 g  4 g Transdermal QHS Connye Burkitt, NP      . traZODone (DESYREL) tablet 50 mg  50 mg Oral QHS PRN Connye Burkitt, NP       PTA Medications: Medications Prior to Admission  Medication Sig Dispense Refill Last Dose  . cholecalciferol (VITAMIN D3) 25 MCG (1000 UT) tablet Take 1,000 Units by mouth daily.     [name] Kitchen REXULTI 1 MG TABS Take 1 tablet by mouth at bedtime.     . Testosterone 20.25 MG/ACT (1.62%) GEL PLACE 3 PUMPS TO UPPER ARM AND SHOULDER ONCE A DAY 75 g 5   . TRINTELLIX 20 MG TABS tablet TAKE 1 TABLET BY MOUTH EVERY DAY. DISCONTINUE WELLBUTRIN (BUPROPION)       Patient Stressors: Occupational concerns Substance abuse  Patient Strengths: Average or above average intelligence Capable of independent living Communication skills General fund of knowledge  Treatment Modalities: Medication Management, Group therapy, Case management,  1 to 1 session  with clinician, Psychoeducation, Recreational therapy.   Physician Treatment Plan for Primary Diagnosis: <principal problem not specified> Long Term Goal(s):     Short Term Goals:    Medication Management: Evaluate patient's response, side effects, and tolerance of medication regimen.  Therapeutic Interventions: 1 to 1 sessions, Unit Group sessions and Medication administration.  Evaluation of Outcomes: Not Met  Physician Treatment Plan for Secondary Diagnosis: Active Problems:   MDD (major depressive disorder), recurrent episode, severe ([place])  Long Term Goal(s):     Short Term Goals:       Medication Management: Evaluate patient's response, side effects, and tolerance of medication regimen.  Therapeutic Interventions: 1 to 1 sessions, Unit Group sessions and Medication administration.  Evaluation of Outcomes: Not Met   RN Treatment Plan for Primary Diagnosis: <principal problem not specified> Long Term Goal(s): Knowledge of disease and therapeutic regimen to maintain health will improve  Short Term Goals: Ability to demonstrate self-control, Ability to participate in decision making will improve, Ability to verbalize feelings will improve, Ability to disclose and discuss suicidal ideas, Ability to identify and develop effective coping behaviors will improve and Compliance with prescribed medications will improve  Medication Management: RN will administer medications as ordered by provider, will assess and evaluate patient's response and provide education to patient for prescribed medication. RN will report any adverse and/or  side effects to prescribing provider.  Therapeutic Interventions: 1 on 1 counseling sessions, Psychoeducation, Medication administration, Evaluate responses to treatment, Monitor vital signs and CBGs as ordered, Perform/monitor CIWA, COWS, AIMS and Fall Risk screenings as ordered, Perform wound care treatments as ordered.  Evaluation of Outcomes: Not  Met   LCSW Treatment Plan for Primary Diagnosis: <principal problem not specified> Long Term Goal(s): Safe transition to appropriate next level of care at discharge, Engage patient in therapeutic group addressing interpersonal concerns.  Short Term Goals: Engage patient in aftercare planning with referrals and resources, Increase social support, Increase ability to appropriately verbalize feelings, Increase emotional regulation, Facilitate acceptance of mental health diagnosis and concerns and Increase skills for wellness and recovery  Therapeutic Interventions: Assess for all discharge needs, 1 to 1 time with Social worker, Explore available resources and support systems, Assess for adequacy in community support network, Educate family and significant other(s) on suicide prevention, Complete Psychosocial Assessment, Interpersonal group therapy.  Evaluation of Outcomes: Not Met   Progress in Treatment: Attending groups: Yes. Participating in groups: Yes. Taking medication as prescribed: Yes. Toleration medication: Yes. Family/Significant other contact made: No, will contact:  once permission is given. Patient understands diagnosis: Yes. Discussing patient identified problems/goals with staff: Yes. Medical problems stabilized or resolved: Yes. Denies suicidal/homicidal ideation: Yes. Issues/concerns per patient self-inventory: No. Other: none  New problem(s) identified: No, Describe:  none  New Short Term/Long Term Goal(s): medication management for mood stabilization; elimination of SI thoughts; development of comprehensive mental wellness/sobriety plan.  Patient Goals:  "try to get a handle on my depression".  Discharge Plan or Barriers: Pt reports plans to continue with current providers. Pt sees Dr. Toy Care for psychiatric medications and Dr. Louanne Skye for psychology.  Reason for Continuation of Hospitalization: Anxiety Depression Medication stabilization  Estimated Length of  Stay: 1-7 days  Recreational Therapy: Patient Stressors: N/A Patient Goal: Patient will engage in groups without prompting or encouragement from LRT x3 group sessions within 5 recreation therapy group sessions  Attendees: Patient: Ethan Osborn 10/15/2019 11:46 AM  Physician: Dr. Weber Cooks, MD 10/15/2019 11:46 AM  Nursing: Polly Cobia, RN 10/15/2019 11:46 AM  RN Care Manager: 10/15/2019 11:46 AM  Social Worker: Assunta Curtis, LCSW 10/15/2019 11:46 AM  Recreational Therapist: Roanna Epley, Reather Converse, LRT 10/15/2019 11:46 AM  Other: Sanjuana Kava, LCSW 10/15/2019 11:46 AM  Other:  10/15/2019 11:46 AM  Other: 10/15/2019 11:46 AM    Scribe for Treatment Team: [name], LCSW 10/15/2019 11:46 AM

## 2019-10-15 NOTE — Progress Notes (Signed)
Pt is feeling better today and attended all groups. Pt is still withdrawn to his room when not in groups. Pt is calm and cooperative with a sad effect. Collier Bullock RN

## 2019-10-15 NOTE — [facility] Group Notes (Signed)
[place] Group Notes:  (Nursing/MHT/Case Management/Adjunct)  Date:  10/15/2019  Time:  10:05 AM  Type of Therapy:  Community Meeting   Participation Level:  Did Not Attend   Laurette Schimke 10/15/2019, 10:05 AM

## 2019-10-15 NOTE — H&P (Signed)
Psychiatric Admission Assessment Adult  Patient Identification: Ethan Osborn MRN:  [id number] Date of Evaluation:  10/15/2019 Chief Complaint:  MDD (major depressive disorder), recurrent episode, severe ([place]) [F33.2] Principal Diagnosis: MDD (major depressive disorder), recurrent episode, severe ([place]) Diagnosis:  Principal Problem:   MDD (major depressive disorder), recurrent episode, severe ([place])  History of Present Illness: Patient seen chart reviewed.  54 year old gentleman referred to [place] from [facility] where he presented with worsening symptoms of depression and suicidal ideation.  The patient says that he has been struggling with depression for a couple of years now.  Over the last few months things seem like they have gotten worse.  His mood feels depressed down and negative pretty much all of the time.  He is exhausted much of the day and has trouble sleeping at night.  He has increasingly had difficulty performing his work or maintaining social relations.  Appetite has been decreased really recently.  He does not report any clear psychotic symptoms.  Most recently he started finding himself having intrusive suicidal thoughts of hanging himself or jumping from a height.  This prompted him getting the help that led to this hospitalization.  Patient is currently seeing an outpatient psychiatrist in [place] and has been compliant with recommended medication.  He denies alcohol or drug abuse. Associated Signs/Symptoms: Depression Symptoms:  insomnia, psychomotor retardation, difficulty concentrating, hopelessness, suicidal thoughts with specific plan, (Hypo) Manic Symptoms:  None reported Anxiety Symptoms:  Excessive Worry, Psychotic Symptoms:  None reported PTSD Symptoms: Negative Total Time spent with patient: 1 hour  Past Psychiatric History: No previous hospitalizations.  Patient has been seen by his outpatient psychiatrist for a couple years now and has multiple medication trials.   Currently on Symbyax and lithium but not seeing any benefit.  Previously had had Trintellix among other medicines which seemed to be briefly helpful but then lost its effectiveness.  He has never actually tried to kill himself in the past.  He does not give a history of a mania.  No substance abuse involved.  Patient also had ketamine treatment for depression which she said would be briefly helpful on the day of the treatments but would seem to wear off within a day.  Is the patient at risk to self? Yes.    Has the patient been a risk to self in the past 6 months? Yes.    Has the patient been a risk to self within the distant past? No.  Is the patient a risk to others? No.  Has the patient been a risk to others in the past 6 months? No.  Has the patient been a risk to others within the distant past? No.   Prior Inpatient Therapy:   Prior Outpatient Therapy:    Alcohol Screening: 1. How often do you have a drink containing alcohol?: Never 2. How many drinks containing alcohol do you have on a typical day when you are drinking?: 1 or 2 3. How often do you have six or more drinks on one occasion?: Never AUDIT-C Score: 0 9. Have you or someone else been injured as a result of your drinking?: No 10. Has a relative or friend or a doctor or another health worker been concerned about your drinking or suggested you cut down?: No Alcohol Use Disorder Identification Test Final Score (AUDIT): 0 Alcohol Brief Interventions/Follow-up: AUDIT Score <7 follow-up not indicated Substance Abuse History in the last 12 months:  No. Consequences of Substance Abuse: Negative Previous Psychotropic Medications: Yes  Psychological  Evaluations: Yes  Past Medical History:  Past Medical History:  Diagnosis Date  . Allergy   . Anxiety   . Arthritis    right big toe   . Sleep apnea    wears cpap     Past Surgical History:  Procedure Laterality Date  . OPEN REDUCTION SHOULDER DISLOCATION    . WISDOM TOOTH  EXTRACTION     Family History:  Family History  Problem Relation Age of Onset  . Depression Mother   . Hearing loss Mother   . Colon polyps Mother   . Arthritis Father   . Diabetes Father   . Hearing loss Father   . Heart disease Father   . Hyperlipidemia Father   . Kidney disease Father   . Colon polyps Father   . Depression Sister   . Stroke Maternal Grandfather   . Cancer Paternal Grandmother   . Cancer Paternal Grandfather   . Colon cancer Neg Hx   . Esophageal cancer Neg Hx   . Stomach cancer Neg Hx   . Rectal cancer Neg Hx    Family Psychiatric  History: Patient has both of his parents and one of his sisters have had depression but no one in his family has killed themselves Tobacco Screening: Have you used any form of tobacco in the last 30 days? (Cigarettes, Smokeless Tobacco, Cigars, and/or Pipes): No Social History:  Social History   Substance and Sexual Activity  Alcohol Use Not Currently   Comment: social      Social History   Substance and Sexual Activity  Drug Use Yes  . Types: Marijuana   Comment: twice monthly    Additional Social History: Marital status: Married Number of Years Married: 24 What types of issues is patient dealing with in the relationship?: Pt denies. Does patient have children?: Yes How many children?: 2 How is patient's relationship with their children?: Pt reports "good".                         Allergies:   Allergies  Allergen Reactions  . Penicillins Hives    Shortness of breath   Lab Results:  Results for orders placed or performed during the hospital encounter of 10/14/19 (from the past 48 hour(s))  CBC     Status: None   Collection Time: 10/15/19  6:22 AM  Result Value Ref Range   WBC 6.7 4.0 - 10.5 K/uL   RBC 5.39 4.22 - 5.81 MIL/uL   Hemoglobin 16.8 13.0 - 17.0 g/dL   HCT 48.3 39.0 - 52.0 %   MCV 89.6 80.0 - 100.0 fL   MCH 31.2 26.0 - 34.0 pg   MCHC 34.8 30.0 - 36.0 g/dL   RDW 12.7 11.5 - 15.5 %    Platelets 193 150 - 400 K/uL   nRBC 0.0 0.0 - 0.2 %    Comment: Performed at [facility], [address] [place], [place] [address]  Comprehensive metabolic panel     Status: Abnormal   Collection Time: 10/15/19  6:22 AM  Result Value Ref Range   Sodium 139 135 - 145 mmol/L   Potassium 4.0 3.5 - 5.1 mmol/L   Chloride 105 98 - 111 mmol/L   CO2 24 22 - 32 mmol/L   Glucose, Bld 191 (H) 70 - 99 mg/dL    Comment: Glucose reference range applies only to samples taken after fasting for at least 8 hours.   BUN 20 6 -  20 mg/dL   Creatinine, Ser 0.91 0.61 - 1.24 mg/dL   Calcium 9.3 8.9 - 10.3 mg/dL   Total Protein 6.5 6.5 - 8.1 g/dL   Albumin 4.0 3.5 - 5.0 g/dL   AST 32 15 - 41 U/L   ALT 55 (H) 0 - 44 U/L   Alkaline Phosphatase 52 38 - 126 U/L   Total Bilirubin 1.1 0.3 - 1.2 mg/dL   GFR calc non Af Amer >60 >60 mL/min   GFR calc Af Amer >60 >60 mL/min   Anion gap 10 5 - 15    Comment: Performed at [facility], [address], [place], [place] [address]  Ethanol     Status: None   Collection Time: 10/15/19  6:22 AM  Result Value Ref Range   Alcohol, Ethyl (B) <10 <10 mg/dL    Comment: (NOTE) Lowest detectable limit for serum alcohol is 10 mg/dL. For medical purposes only. Performed at [facility], [address] [place], [place] [address]   Hemoglobin A1c     Status: Abnormal   Collection Time: 10/15/19  6:22 AM  Result Value Ref Range   Hgb A1c MFr Bld 7.5 (H) 4.8 - 5.6 %    Comment: (NOTE) Pre diabetes:          5.7%-6.4% Diabetes:              >6.4% Glycemic control for   <7.0% adults with diabetes    Mean Plasma Glucose 168.55 mg/dL    Comment: Performed at [facility] [address], [place], [place] [address]  Lipid panel     Status: Abnormal   Collection Time: 10/15/19  6:22 AM  Result Value Ref Range   Cholesterol 247 (H) 0 - 200 mg/dL   Triglycerides 448 (H) <150 mg/dL   HDL 34 (L) >40 mg/dL   Total CHOL/HDL Ratio 7.3 RATIO    VLDL UNABLE TO CALCULATE IF TRIGLYCERIDE OVER 400 mg/dL 0 - 40 mg/dL   LDL Cholesterol UNABLE TO CALCULATE IF TRIGLYCERIDE OVER 400 mg/dL 0 - 99 mg/dL    Comment:        Total Cholesterol/HDL:CHD Risk Coronary Heart Disease Risk Table                     Men   Women  1/2 Average Risk   3.4   3.3  Average Risk       5.0   4.4  2 X Average Risk   9.6   7.1  3 X Average Risk  23.4   11.0        Use the calculated Patient Ratio above and the CHD Risk Table to determine the patient's CHD Risk.        ATP III CLASSIFICATION (LDL):  <100     mg/dL   Optimal  100-129  mg/dL   Near or Above                    Optimal  130-159  mg/dL   Borderline  160-189  mg/dL   High  >190     mg/dL   Very High Performed at [facility], [address] [place], [place] [address]   Lithium level     Status: Abnormal   Collection Time: 10/15/19  6:22 AM  Result Value Ref Range   Lithium Lvl <0.06 (L) 0.60 - 1.20 mmol/L    Comment: Performed at [facility], [place]  [address], [place], [place] [address]  TSH     Status: None   Collection Time: 10/15/19  6:22 AM  Result Value Ref Range   TSH 3.883 0.350 - 4.500 uIU/mL    Comment: Performed by a 3rd Generation assay with a functional sensitivity of <=0.01 uIU/mL. Performed at [facility], [address] [place], [place] [address]     Blood Alcohol level:  Lab Results  Component Value Date   [facility] <10 0000000    Metabolic Disorder Labs:  Lab Results  Component Value Date   HGBA1C 7.5 (H) 10/15/2019   MPG 168.55 10/15/2019   No results found for: PROLACTIN Lab Results  Component Value Date   CHOL 247 (H) 10/15/2019   TRIG 448 (H) 10/15/2019   HDL 34 (L) 10/15/2019   CHOLHDL 7.3 10/15/2019   VLDL UNABLE TO CALCULATE IF TRIGLYCERIDE OVER 400 mg/dL 10/15/2019   LDLCALC UNABLE TO CALCULATE IF TRIGLYCERIDE OVER 400 mg/dL 10/15/2019    Current Medications: Current Facility-Administered Medications   Medication Dose Route Frequency Provider Last Rate Last Admin  . acetaminophen (TYLENOL) tablet 650 mg  650 mg Oral Q6H PRN Connye Burkitt, NP      . alum & mag hydroxide-simeth (MAALOX/MYLANTA) 200-200-20 MG/5ML suspension 30 mL  30 mL Oral Q4H PRN Connye Burkitt, NP      . hydrOXYzine (ATARAX/VISTARIL) tablet 25 mg  25 mg Oral TID PRN Connye Burkitt, NP      . magnesium hydroxide (MILK OF MAGNESIA) suspension 30 mL  30 mL Oral Daily PRN Connye Burkitt, NP      . OLANZapine-FLUoxetine (SYMBYAX) 6-25 MG per capsule 1 capsule  1 capsule Oral QHS Connye Burkitt, NP      . testosterone (ANDROGEL) 50 MG/5GM (1%) gel 4 g  4 g Transdermal QHS Connye Burkitt, NP      . traZODone (DESYREL) tablet 50 mg  50 mg Oral QHS PRN Connye Burkitt, NP       PTA Medications: Medications Prior to Admission  Medication Sig Dispense Refill Last Dose  . cholecalciferol (VITAMIN D3) 25 MCG (1000 UT) tablet Take 1,000 Units by mouth daily.     [name] Kitchen REXULTI 1 MG TABS Take 1 tablet by mouth at bedtime.     . Testosterone 20.25 MG/ACT (1.62%) GEL PLACE 3 PUMPS TO UPPER ARM AND SHOULDER ONCE A DAY 75 g 5   . TRINTELLIX 20 MG TABS tablet TAKE 1 TABLET BY MOUTH EVERY DAY. DISCONTINUE WELLBUTRIN (BUPROPION)       Musculoskeletal: Strength & Muscle Tone: within normal limits Gait & Station: normal Patient leans: N/A  Psychiatric Specialty Exam: Physical Exam  Nursing note and vitals reviewed. Constitutional: He appears well-developed and well-nourished.  HENT:  Head: Normocephalic and atraumatic.  Eyes: Pupils are equal, round, and reactive to light. Conjunctivae are normal.  Cardiovascular: Regular rhythm and normal heart sounds.  Respiratory: Effort normal. No respiratory distress.  GI: Soft.  Musculoskeletal:        General: Normal range of motion.     Cervical back: Normal range of motion.  Neurological: He is alert.  Skin: Skin is warm and dry.  Psychiatric: Judgment normal. His speech is delayed. He is  slowed. Cognition and memory are normal. He exhibits a depressed mood. He expresses suicidal ideation. He expresses suicidal plans.    Review of Systems  Constitutional: Negative.   HENT: Negative.   Eyes: Negative.   Respiratory: Negative.   Cardiovascular:  Negative.   Gastrointestinal: Negative.   Musculoskeletal: Negative.   Skin: Negative.   Neurological: Negative.   Psychiatric/Behavioral: Positive for dysphoric mood, sleep disturbance and suicidal ideas.    Blood pressure 116/84, pulse 60, temperature 98.1 F (36.7 C), temperature source Oral, resp. rate 18, height 5\' 11"  (1.803 m), weight 105.5 kg, SpO2 96 %.Body mass index is 32.43 kg/m.  General Appearance: Casual  Eye Contact:  Good  Speech:  Clear and Coherent and Slow  Volume:  Decreased  Mood:  Depressed  Affect:  Depressed  Thought Process:  Coherent  Orientation:  Full (Time, Place, and Person)  Thought Content:  Rumination and Tangential  Suicidal Thoughts:  Yes.  with intent/plan  Homicidal Thoughts:  No  Memory:  Immediate;   Fair Recent;   Fair Remote;   Fair  Judgement:  Fair  Insight:  Fair  Psychomotor Activity:  Decreased  Concentration:  Concentration: Fair  Recall:  [employer] of Knowledge:  Fair  Language:  Fair  Akathisia:  No  Handed:  Right  AIMS (if indicated):     Assets:  Desire for Improvement Financial Resources/Insurance Physical Health Resilience Social Support  ADL's:  Intact  Cognition:  WNL  Sleep:  Number of Hours: 6.75    Treatment Plan Summary: Daily contact with patient to assess and evaluate symptoms and progress in treatment, Medication management and Plan 54 year old man with severe major depression.  No psychotic features although he admitted that sometimes he would "see shadows".  Patient is able to understand risks and benefits of treatment and make decisions for himself.  He has failed multiple trials of antidepressants as well as ketamine treatment.  He has no  significant medical issues outside of his depression.  Patient is a good candidate for electroconvulsive therapy.  We discussed ECT in some detail and the patient was given recommendations and ample opportunity to ask questions.  He expressed agreement to the plan to start ECT tomorrow.  We will stop the lithium for now and get an EKG.  Continue 15-minute checks.  I will let the ECT team know and also sent a message to his outpatient provider.  Observation Level/Precautions:  15 minute checks  Laboratory:  EKG  Psychotherapy:    Medications:    Consultations:    Discharge Concerns:    Estimated LOS:  Other:     Physician Treatment Plan for Primary Diagnosis: MDD (major depressive disorder), recurrent episode, severe ([place]) Long Term Goal(s): Improvement in symptoms so as ready for discharge  Short Term Goals: Ability to verbalize feelings will improve, Ability to disclose and discuss suicidal ideas and Ability to demonstrate self-control will improve  Physician Treatment Plan for Secondary Diagnosis: Principal Problem:   MDD (major depressive disorder), recurrent episode, severe ([place])  Long Term Goal(s): Improvement in symptoms so as ready for discharge  Short Term Goals: Ability to maintain clinical measurements within normal limits will improve and Compliance with prescribed medications will improve  I certify that inpatient services furnished can reasonably be expected to improve the patient's condition.    Alethia Berthold, MD 3/4/202112:47 PM
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
## 2019-10-15 NOTE — Plan of Care (Addendum)
Pt rates depression 8/10 and hopelessness and anxiety both 7/10. Pt has passive SI but contracts for safety. Pt denies HI and AVH. Pt was educated on care plan and verbalizes understanding. Collier Bullock RN Problem: Education: Goal: Knowledge of [facility] Education information/materials will improve Outcome: Progressing Goal: Emotional status will improve Outcome: Progressing Goal: Mental status will improve Outcome: Progressing Goal: Verbalization of understanding the information provided will improve Outcome: Progressing   Problem: Activity: Goal: Interest or engagement in activities will improve Outcome: Progressing Goal: Sleeping patterns will improve Outcome: Progressing   Problem: Coping: Goal: Ability to verbalize frustrations and anger appropriately will improve Outcome: Progressing Goal: Ability to demonstrate self-control will improve Outcome: Progressing   Problem: Health Behavior/Discharge Planning: Goal: Identification of resources available to assist in meeting health care needs will improve Outcome: Progressing Goal: Compliance with treatment plan for underlying cause of condition will improve Outcome: Progressing   Problem: Physical Regulation: Goal: Ability to maintain clinical measurements within normal limits will improve Outcome: Progressing   Problem: Safety: Goal: Periods of time without injury will increase Outcome: Progressing   Problem: Education: Goal: Utilization of techniques to improve thought processes will improve Outcome: Progressing Goal: Knowledge of the prescribed therapeutic regimen will improve Outcome: Progressing   Problem: Activity: Goal: Interest or engagement in leisure activities will improve Outcome: Progressing Goal: Imbalance in normal sleep/wake cycle will improve Outcome: Progressing   Problem: Coping: Goal: Coping ability will improve Outcome: Progressing Goal: Will verbalize feelings Outcome: Progressing   Problem: Health Behavior/Discharge Planning: Goal: Ability to make decisions will improve Outcome: Progressing Goal: Compliance with therapeutic regimen will improve Outcome: Progressing   Problem: Role Relationship: Goal: Will demonstrate positive changes in social behaviors and relationships Outcome: Progressing   Problem: Safety: Goal: Ability to disclose and discuss suicidal ideas will improve Outcome: Progressing Goal: Ability to identify and utilize support systems that promote safety will improve Outcome: Progressing   Problem: Self-Concept: Goal: Will verbalize positive feelings about self Outcome: Progressing Goal: Level of anxiety will decrease Outcome: Progressing   Problem: Education: Goal: Utilization of techniques to improve thought processes will improve Outcome: Progressing Goal: Knowledge of the prescribed therapeutic regimen will improve Outcome: Progressing   Problem: Activity: Goal: Interest or engagement in leisure activities will improve Outcome: Progressing Goal: Imbalance in normal sleep/wake cycle will improve Outcome: Progressing   Problem: Coping: Goal: Coping ability will improve Outcome: Progressing Goal: Will verbalize feelings Outcome: Progressing   Problem: Health Behavior/Discharge Planning: Goal: Ability to make decisions will improve Outcome: Progressing Goal: Compliance with therapeutic regimen will improve Outcome: Progressing   Problem: Role Relationship: Goal: Will demonstrate positive changes in social behaviors and relationships Outcome: Progressing   Problem: Safety: Goal: Ability to disclose and discuss suicidal ideas will improve Outcome: Progressing Goal: Ability to identify and utilize support systems that promote safety will improve Outcome: Progressing   Problem: Self-Concept: Goal: Will verbalize positive feelings about self Outcome: Progressing Goal: Level of anxiety will decrease Outcome: Progressing

## 2019-10-15 NOTE — [facility] Group Notes (Signed)
LCSW Group Therapy Note  10/15/2019 2:42 PM  Type of Therapy/Topic:  Group Therapy:  Balance in Life  Participation Level:  Did Not Attend  Description of Group:    This group will address the concept of balance and how it feels and looks when one is unbalanced. Patients will be encouraged to process areas in their lives that are out of balance and identify reasons for remaining unbalanced. Facilitators will guide patients in utilizing problem-solving interventions to address and correct the stressor making their life unbalanced. Understanding and applying boundaries will be explored and addressed for obtaining and maintaining a balanced life. Patients will be encouraged to explore ways to assertively make their unbalanced needs known to significant others in their lives, using other group members and facilitator for support and feedback.  Therapeutic Goals: 1. Patient will identify two or more emotions or situations they have that consume much of in their lives. 2. Patient will identify signs/triggers that life has become out of balance:  3. Patient will identify two ways to set boundaries in order to achieve balance in their lives:  4. Patient will demonstrate ability to communicate their needs through discussion and/or role plays  Summary of Patient Progress:  X  Therapeutic Modalities:   Cognitive Behavioral Therapy Solution-Focused Therapy Assertiveness Training  Assunta Curtis MSW, LCSW 10/15/2019 2:42 PM

## 2019-10-16 ENCOUNTER — Inpatient Hospital Stay: Payer: BC Managed Care – PPO | Admitting: Anesthesiology

## 2019-10-16 ENCOUNTER — Other Ambulatory Visit: Payer: Self-pay | Admitting: Psychiatry

## 2019-10-16 ENCOUNTER — Encounter: Payer: Self-pay | Admitting: Psychiatric/Mental Health

## 2019-10-16 LAB — PROLACTIN: Prolactin: 10.4 ng/mL (ref 4.0–15.2)

## 2019-10-16 LAB — TESTOSTERONE: Testosterone: 411 ng/dL (ref 264–916)

## 2019-10-16 MED ORDER — METHOHEXITAL SODIUM 100 MG/10ML IV SOSY
PREFILLED_SYRINGE | INTRAVENOUS | Status: DC | PRN
  Administered 2019-10-16: 100 mg via INTRAVENOUS

## 2019-10-16 MED ORDER — MIDAZOLAM HCL 2 MG/2ML IJ SOLN
INTRAMUSCULAR | Status: AC
  Filled 2019-10-16: qty 2

## 2019-10-16 MED ORDER — GLYCOPYRROLATE 0.2 MG/ML IJ SOLN
INTRAMUSCULAR | Status: AC
  Filled 2019-10-16: qty 1

## 2019-10-16 MED ORDER — METHOHEXITAL SODIUM 0.5 G IJ SOLR
INTRAMUSCULAR | Status: AC
  Filled 2019-10-16: qty 500

## 2019-10-16 MED ORDER — HALOPERIDOL LACTATE 5 MG/ML IJ SOLN
INTRAMUSCULAR | Status: AC
  Filled 2019-10-16: qty 1

## 2019-10-16 MED ORDER — SODIUM CHLORIDE 0.9 % IV SOLN: INTRAVENOUS | Status: DC | PRN

## 2019-10-16 MED ORDER — SUCCINYLCHOLINE CHLORIDE 20 MG/ML IJ SOLN
INTRAMUSCULAR | Status: DC | PRN
  Administered 2019-10-16: 120 mg via INTRAVENOUS

## 2019-10-16 MED ORDER — HALOPERIDOL LACTATE 5 MG/ML IJ SOLN
INTRAMUSCULAR | Status: DC | PRN
  Administered 2019-10-16 (×2): 5 mg via INTRAMUSCULAR

## 2019-10-16 MED ORDER — GLYCOPYRROLATE 0.2 MG/ML IJ SOLN
0.1000 mg | Freq: Once | INTRAMUSCULAR | Status: AC
  Administered 2019-10-16: 0.1 mg via INTRAVENOUS

## 2019-10-16 MED ORDER — SUCCINYLCHOLINE CHLORIDE 20 MG/ML IJ SOLN
INTRAMUSCULAR | Status: AC
  Filled 2019-10-16: qty 1

## 2019-10-16 MED ORDER — ONDANSETRON HCL 4 MG/2ML IJ SOLN: 4.0000 mg | Freq: Once | INTRAMUSCULAR | Status: DC | PRN

## 2019-10-16 MED ORDER — SODIUM CHLORIDE 0.9 % IV SOLN
500.0000 mL | Freq: Once | INTRAVENOUS | Status: AC
  Administered 2019-10-16: 500 mL via INTRAVENOUS

## 2019-10-16 MED ORDER — MIDAZOLAM HCL 2 MG/2ML IJ SOLN
INTRAMUSCULAR | Status: DC | PRN
  Administered 2019-10-16 (×3): 2 mg via INTRAVENOUS

## 2019-10-16 NOTE — Progress Notes (Signed)
Recreation Therapy Notes  Date: 10/16/2019  Time: 9:30 am   Location: Craft room   Behavioral response: N/A   Intervention Topic: Self-esteem   Discussion/Intervention: Patient did not attend group.   Clinical Observations/Feedback:  Patient did not attend group.   Kwan Shellhammer LRT/CTRS        Gertrude Bucks 10/16/2019 10:43 AM

## 2019-10-16 NOTE — Procedures (Signed)
ECT SERVICES Physician's Interval Evaluation & Treatment Note  Patient Identification: Ethan Osborn MRN:  [id number] Date of Evaluation:  10/16/2019 TX #: 1  MADRS:   MMSE:   P.E. Findings:  Physical exam unremarkable  Psychiatric Interval Note:  Depressed but lucid with no psychosis  Subjective:  Patient is a 54 y.o. male seen for evaluation for Electroconvulsive Therapy. Severe depression with recent suicidal ideation  Treatment Summary:   [x]   Right Unilateral             []  Bilateral   % Energy : 0.3 ms 60%   Impedance: 1640 ohms  Seizure Energy Index: 16,973 V squared  Postictal Suppression Index: Less than 10%  Seizure Concordance Index: 95%  Medications  Pre Shock: Robinul 0.1 mg Brevital 100 mg succinylcholine 120 mg  Post Shock:    Seizure Duration: 36 seconds EMG 73 seconds EEG although I suspect it may have actually gone on longer   Comments: Increase Robinul to 0.2 mg and increase succinylcholine to 150 mg  Lungs:  [x]   Clear to auscultation               []  Other:   Heart:    [x]   Regular rhythm             []  irregular rhythm    [x]   Previous H&P reviewed, patient examined and there are NO CHANGES                 []   Previous H&P reviewed, patient examined and there are changes noted.   Alethia Berthold, MD 3/5/202110:29 AM

## 2019-10-16 NOTE — Progress Notes (Signed)
[facility] MD Progress Note  10/16/2019 4:20 PM Ethan Osborn  MRN:  [id number] Subjective: Follow-up for this 54 year old man with severe depression.  Patient had electroconvulsive therapy this morning right unilateral treatment.  The treatment itself was without any complication but he had a postictal delirium waking up that required administration of several doses of Haldol and Versed before he calm down.  Fortunately however there was no injury to himself or anyone else.  Seen this afternoon patient has no memory of it.  Complains of minor aches and pains.  Continues to feel depressed but not as hopeless. Principal Problem: MDD (major depressive disorder), recurrent episode, severe ([place]) Diagnosis: Principal Problem:   MDD (major depressive disorder), recurrent episode, severe ([place])  Total Time spent with patient: 30 minutes  Past Psychiatric History: No previous hospitalizations but multiple previous medication trials  Past Medical History:  Past Medical History:  Diagnosis Date  . Allergy   . Anxiety   . Arthritis    right big toe   . Sleep apnea    wears cpap     Past Surgical History:  Procedure Laterality Date  . OPEN REDUCTION SHOULDER DISLOCATION    . WISDOM TOOTH EXTRACTION     Family History:  Family History  Problem Relation Age of Onset  . Depression Mother   . Hearing loss Mother   . Colon polyps Mother   . Arthritis Father   . Diabetes Father   . Hearing loss Father   . Heart disease Father   . Hyperlipidemia Father   . Kidney disease Father   . Colon polyps Father   . Depression Sister   . Stroke Maternal Grandfather   . Cancer Paternal Grandmother   . Cancer Paternal Grandfather   . Colon cancer Neg Hx   . Esophageal cancer Neg Hx   . Stomach cancer Neg Hx   . Rectal cancer Neg Hx    Family Psychiatric  History: See previous Social History:  Social History   Substance and Sexual Activity  Alcohol Use Not Currently   Comment: social      Social  History   Substance and Sexual Activity  Drug Use Yes  . Types: Marijuana   Comment: twice monthly    Social History   Socioeconomic History  . Marital status: Married    Spouse name: Not on file  . Number of children: Not on file  . Years of education: Not on file  . Highest education level: Not on file  Occupational History  . Occupation: Armed forces technical officer   Tobacco Use  . Smoking status: Never Smoker  . Smokeless tobacco: Never Used  Substance and Sexual Activity  . Alcohol use: Not Currently    Comment: social   . Drug use: Yes    Types: Marijuana    Comment: twice monthly  . Sexual activity: Yes  Other Topics Concern  . Not on file  Social History Narrative  . Not on file   Social Determinants of Health   Financial Resource Strain:   . Difficulty of Paying Living Expenses: Not on file  Food Insecurity:   . Worried About Charity fundraiser in the Last Year: Not on file  . Ran Out of Food in the Last Year: Not on file  Transportation Needs:   . Lack of Transportation (Medical): Not on file  . Lack of Transportation (Non-Medical): Not on file  Physical Activity:   . Days of Exercise per Week: Not on file  .  Minutes of Exercise per Session: Not on file  Stress:   . Feeling of Stress : Not on file  Social Connections:   . Frequency of Communication with Friends and Family: Not on file  . Frequency of Social Gatherings with Friends and Family: Not on file  . Attends Religious Services: Not on file  . Active Member of Clubs or Organizations: Not on file  . Attends Archivist Meetings: Not on file  . Marital Status: Not on file   Additional Social History:                         Sleep: Fair  Appetite:  Fair  Current Medications: Current Facility-Administered Medications  Medication Dose Route Frequency Provider Last Rate Last Admin  . acetaminophen (TYLENOL) tablet 650 mg  650 mg Oral Q6H PRN Connye Burkitt, NP      . alum & mag  hydroxide-simeth (MAALOX/MYLANTA) 200-200-20 MG/5ML suspension 30 mL  30 mL Oral Q4H PRN Connye Burkitt, NP      . glycopyrrolate (ROBINUL) 0.2 MG/ML injection           . hydrOXYzine (ATARAX/VISTARIL) tablet 25 mg  25 mg Oral TID PRN Connye Burkitt, NP      . magnesium hydroxide (MILK OF MAGNESIA) suspension 30 mL  30 mL Oral Daily PRN Connye Burkitt, NP      . OLANZapine-FLUoxetine (SYMBYAX) 6-25 MG per capsule 1 capsule  1 capsule Oral QHS Connye Burkitt, NP      . ondansetron [facility]) injection 4 mg  4 mg Intravenous Once PRN Gunnar Fusi, MD      . testosterone (ANDROGEL) 50 MG/5GM (1%) gel 4 g  4 g Transdermal QHS Connye Burkitt, NP      . traZODone (DESYREL) tablet 50 mg  50 mg Oral QHS PRN Connye Burkitt, NP   50 mg at 10/15/19 2141    Lab Results:  Results for orders placed or performed during the hospital encounter of 10/14/19 (from the past 48 hour(s))  CBC     Status: None   Collection Time: 10/15/19  6:22 AM  Result Value Ref Range   WBC 6.7 4.0 - 10.5 K/uL   RBC 5.39 4.22 - 5.81 MIL/uL   Hemoglobin 16.8 13.0 - 17.0 g/dL   HCT 48.3 39.0 - 52.0 %   MCV 89.6 80.0 - 100.0 fL   MCH 31.2 26.0 - 34.0 pg   MCHC 34.8 30.0 - 36.0 g/dL   RDW 12.7 11.5 - 15.5 %   Platelets 193 150 - 400 K/uL   nRBC 0.0 0.0 - 0.2 %    Comment: Performed at [facility], [address], [place], [place] [address]  Comprehensive metabolic panel     Status: Abnormal   Collection Time: 10/15/19  6:22 AM  Result Value Ref Range   Sodium 139 135 - 145 mmol/L   Potassium 4.0 3.5 - 5.1 mmol/L   Chloride 105 98 - 111 mmol/L   CO2 24 22 - 32 mmol/L   Glucose, Bld 191 (H) 70 - 99 mg/dL    Comment: Glucose reference range applies only to samples taken after fasting for at least 8 hours.   BUN 20 6 - 20 mg/dL   Creatinine, Ser 0.91 0.61 - 1.24 mg/dL   Calcium 9.3 8.9 - 10.3 mg/dL   Total Protein 6.5 6.5 - 8.1 g/dL   Albumin 4.0 3.5 -  5.0 g/dL   AST 32 15 - 41 U/L   ALT 55 (H) 0 - 44 U/L    Alkaline Phosphatase 52 38 - 126 U/L   Total Bilirubin 1.1 0.3 - 1.2 mg/dL   GFR calc non Af Amer >60 >60 mL/min   GFR calc Af Amer >60 >60 mL/min   Anion gap 10 5 - 15    Comment: Performed at [facility], [address] [place], [place] [address]  Ethanol     Status: None   Collection Time: 10/15/19  6:22 AM  Result Value Ref Range   Alcohol, Ethyl (B) <10 <10 mg/dL    Comment: (NOTE) Lowest detectable limit for serum alcohol is 10 mg/dL. For medical purposes only. Performed at [facility], [address] [place], [place] [address]   Hemoglobin A1c     Status: Abnormal   Collection Time: 10/15/19  6:22 AM  Result Value Ref Range   Hgb A1c MFr Bld 7.5 (H) 4.8 - 5.6 %    Comment: (NOTE) Pre diabetes:          5.7%-6.4% Diabetes:              >6.4% Glycemic control for   <7.0% adults with diabetes    Mean Plasma Glucose 168.55 mg/dL    Comment: Performed at [facility] [address] [place], [place] [address]  Lipid panel     Status: Abnormal   Collection Time: 10/15/19  6:22 AM  Result Value Ref Range   Cholesterol 247 (H) 0 - 200 mg/dL   Triglycerides 448 (H) <150 mg/dL   HDL 34 (L) >40 mg/dL   Total CHOL/HDL Ratio 7.3 RATIO   VLDL UNABLE TO CALCULATE IF TRIGLYCERIDE OVER 400 mg/dL 0 - 40 mg/dL   LDL Cholesterol UNABLE TO CALCULATE IF TRIGLYCERIDE OVER 400 mg/dL 0 - 99 mg/dL    Comment:        Total Cholesterol/HDL:CHD Risk Coronary Heart Disease Risk Table                     Men   Women  1/2 Average Risk   3.4   3.3  Average Risk       5.0   4.4  2 X Average Risk   9.6   7.1  3 X Average Risk  23.4   11.0        Use the calculated Patient Ratio above and the CHD Risk Table to determine the patient's CHD Risk.        ATP III CLASSIFICATION (LDL):  <100     mg/dL   Optimal  100-129  mg/dL   Near or Above                    Optimal  130-159  mg/dL   Borderline  160-189  mg/dL   High  >190     mg/dL   Very High Performed at  [facility], [address] [place], [place] [address]   Lithium level     Status: Abnormal   Collection Time: 10/15/19  6:22 AM  Result Value Ref Range   Lithium Lvl <0.06 (L) 0.60 - 1.20 mmol/L    Comment: Performed at [facility], [address] [place], [place] [address]  Prolactin     Status: None   Collection Time: 10/15/19  6:22 AM  Result Value Ref Range   Prolactin 10.4 4.0 - 15.2  ng/mL    Comment: (NOTE) Performed At: [facility] [place], [place] [id number] Rush Farmer MD [id number]   TSH     Status: None   Collection Time: 10/15/19  6:22 AM  Result Value Ref Range   TSH 3.883 0.350 - 4.500 uIU/mL    Comment: Performed by a 3rd Generation assay with a functional sensitivity of <=0.01 uIU/mL. Performed at [facility], [place], [place] [address]   Testosterone     Status: None   Collection Time: 10/15/19  6:22 AM  Result Value Ref Range   Testosterone 411 264 - 916 ng/dL    Comment: (NOTE) Adult male reference interval is based on a population of healthy nonobese males (BMI <30) between 19 and 27 years old. [place], [place] [id number]. PMID: [id number]. Performed At: [facility] [place], [place] [id number] Rush Farmer MD [id number]   LDL cholesterol, direct     Status: Abnormal   Collection Time: 10/15/19  6:22 AM  Result Value Ref Range   Direct LDL 114.5 (H) 0 - 99 mg/dL    Comment: Performed at [facility] [address], [place], [place] [address]    Blood Alcohol level:  Lab Results  Component Value Date   ETH <10 0000000    Metabolic Disorder Labs: Lab Results  Component Value Date   HGBA1C 7.5 (H) 10/15/2019   MPG 168.55 10/15/2019   Lab Results  Component Value Date   PROLACTIN 10.4 10/15/2019   Lab Results  Component Value Date   CHOL 247 (H) 10/15/2019   TRIG 448 (H) 10/15/2019   HDL 34 (L) 10/15/2019    CHOLHDL 7.3 10/15/2019   VLDL UNABLE TO CALCULATE IF TRIGLYCERIDE OVER 400 mg/dL 10/15/2019   LDLCALC UNABLE TO CALCULATE IF TRIGLYCERIDE OVER 400 mg/dL 10/15/2019    Physical Findings: AIMS: Facial and Oral Movements Muscles of Facial Expression: None, normal Lips and Perioral Area: None, normal Jaw: None, normal Tongue: None, normal,Extremity Movements Upper (arms, wrists, hands, fingers): None, normal Lower (legs, knees, ankles, toes): None, normal, Trunk Movements Neck, shoulders, hips: None, normal, Overall Severity Severity of abnormal movements (highest score from questions above): None, normal Incapacitation due to abnormal movements: None, normal Patient's awareness of abnormal movements (rate only patient's report): No Awareness, Dental Status Current problems with teeth and/or dentures?: No Does patient usually wear dentures?: No  CIWA:    COWS:     Musculoskeletal: Strength & Muscle Tone: within normal limits Gait & Station: normal Patient leans: N/A  Psychiatric Specialty Exam: Physical Exam  Nursing note and vitals reviewed. Constitutional: He appears well-developed and well-nourished.  HENT:  Head: Normocephalic and atraumatic.  Eyes: Pupils are equal, round, and reactive to light. Conjunctivae are normal.  Cardiovascular: Normal heart sounds.  Respiratory: Effort normal.  GI: Soft.  Musculoskeletal:        General: Normal range of motion.     Cervical back: Normal range of motion.  Neurological: He is alert.  Skin: Skin is warm and dry.  Psychiatric: Judgment normal. His speech is delayed. He is slowed. He exhibits a depressed mood. He expresses suicidal ideation. He expresses suicidal plans. He exhibits abnormal recent memory.    Review of Systems  Constitutional: Negative.   HENT: Negative.   Eyes: Negative.   Respiratory: Negative.   Cardiovascular: Negative.   Gastrointestinal: Negative.   Musculoskeletal: Negative.   Skin: Negative.    Neurological: Negative.   Psychiatric/Behavioral: Positive  for dysphoric mood and suicidal ideas.    Blood pressure 128/81, pulse 96, temperature 98.2 F (36.8 C), temperature source Oral, resp. rate 18, height 5\' 11"  (1.803 m), weight 104.8 kg, SpO2 96 %.Body mass index is 32.22 kg/m.  General Appearance: Casual  Eye Contact:  Fair  Speech:  Clear and Coherent  Volume:  Decreased  Mood:  Depressed  Affect:  Congruent  Thought Process:  Goal Directed  Orientation:  Full (Time, Place, and Person)  Thought Content:  Logical  Suicidal Thoughts:  Yes.  with intent/plan  Homicidal Thoughts:  No  Memory:  Immediate;   Fair Recent;   Fair Remote;   Fair  Judgement:  Fair  Insight:  Fair  Psychomotor Activity:  Normal  Concentration:  Concentration: Fair  Recall:  [employer] of Knowledge:  Fair  Language:  Fair  Akathisia:  No  Handed:  Right  AIMS (if indicated):     Assets:  Desire for Improvement Housing Resilience Social Support  ADL's:  Intact  Cognition:  WNL  Sleep:  Number of Hours: 7     Treatment Plan Summary: Daily contact with patient to assess and evaluate symptoms and progress in treatment, Medication management and Plan Next ECT Monday we will and we will be able to adjust anesthesia procedure to avoid delirium.  No change to medication for the weekend.  Patient agrees to plan continue current medicine.  Encourage group participation.  Alethia Berthold, MD 10/16/2019, 4:20 PM

## 2019-10-16 NOTE — Anesthesia Preprocedure Evaluation (Signed)
Anesthesia Evaluation  Patient identified by MRN, date of birth, ID band Patient awake    Reviewed: Allergy & Precautions, NPO status , Patient's Chart, lab work & pertinent test results  History of Anesthesia Complications Negative for: history of anesthetic complications  Airway Mallampati: III       Dental   Pulmonary sleep apnea and Continuous Positive Airway Pressure Ventilation , neg COPD, Not current smoker,           Cardiovascular (-) hypertension(-) Past MI and (-) CHF (-) dysrhythmias (-) Valvular Problems/Murmurs     Neuro/Psych neg Seizures Anxiety Depression    GI/Hepatic Neg liver ROS, neg GERD  ,  Endo/Other  neg diabetes  Renal/GU negative Renal ROS     Musculoskeletal   Abdominal   Peds  Hematology   Anesthesia Other Findings   Reproductive/Obstetrics                             Anesthesia Physical Anesthesia Plan  ASA: III  Anesthesia Plan: General   Post-op Pain Management:    Induction: Intravenous  PONV Risk Score and Plan: 2 and TIVA and Treatment may vary due to age or medical condition  Airway Management Planned: Mask  Additional Equipment:   Intra-op Plan:   Post-operative Plan:   Informed Consent: I have reviewed the patients History and Physical, chart, labs and discussed the procedure including the risks, benefits and alternatives for the proposed anesthesia with the patient or authorized representative who has indicated his/her understanding and acceptance.       Plan Discussed with:   Anesthesia Plan Comments:         Anesthesia Quick Evaluation

## 2019-10-16 NOTE — BHH Group Notes (Signed)
[place] Group Notes:  (Nursing/MHT/Case Management/Adjunct)  Date:  10/16/2019  Time:  8:30AM Type of Therapy:  Community Meeting  Participation Level:  Did Not Attend  Laurette Schimke 10/16/2019, 10:22 AM

## 2019-10-16 NOTE — Anesthesia Postprocedure Evaluation (Signed)
Anesthesia Post Note  Patient: Ethan Osborn  Procedure(s) Performed: ECT TX  Patient location during evaluation: PACU Anesthesia Type: General Level of consciousness: awake and alert Pain management: pain level controlled Vital Signs Assessment: post-procedure vital signs reviewed and stable Respiratory status: spontaneous breathing and respiratory function stable Cardiovascular status: stable Anesthetic complications: no     Last Vitals:  Vitals:   10/16/19 1103 10/16/19 1108  BP: 119/73 119/73  Pulse: 99   Resp: 16 16  Temp:    SpO2: 94% 95%    Last Pain:  Vitals:   10/16/19 1103  TempSrc:   PainSc: Asleep                 Dericka Ostenson K

## 2019-10-16 NOTE — Plan of Care (Signed)
Patient back from ECT.Patient stated " feels groggy and does not remember what happened at the ECT.Patient had lunch and ambulated back to his room.Patient rated his depression and anxiety 5/10.Denies SI,HI and AVH.Patient is out in the milieu.Appropriate with staff & peers.Appetite and energy level good.Support and encouragement given.

## 2019-10-16 NOTE — B[facility] Group Notes (Signed)
[place] Group Notes:  (Nursing/MHT/Case Management/Adjunct)  Date:  10/16/2019  Time:  1:45 AM  Type of Therapy:  Group Therapy  Participation Level:  Did Not Attend    Ethan Osborn 10/16/2019, 1:45 AM

## 2019-10-16 NOTE — Progress Notes (Signed)
Patient alert and oriented x 4, affect is flat and sad but he brightens upon approach, he denies SI/HI/AVH , noted in the milieu interacting with peers. Patient rated depression a 7/10 on a scale of (0 low - 10 high) he was offered some emotional support and encouraged to attend evening wrap up group.patietn didn't attend evening wrap up group, he was compliant with medication regimen, 15 minutes safety checks maintained will continue to monitor.

## 2019-10-16 NOTE — Transfer of Care (Signed)
Immediate Anesthesia Transfer of Care Note  Patient: Ethan Osborn  Procedure(s) Performed: ECT TX  Patient Location: PACU  Anesthesia Type:General  Level of Consciousness: drowsy and patient cooperative  Airway & Oxygen Therapy: Patient Spontanous Breathing and Patient connected to face mask oxygen  Post-op Assessment: Report given to RN and Post -op Vital signs reviewed and stable  Post vital signs: Reviewed and stable  Last Vitals:  Vitals Value Taken Time  BP 119/73 10/16/19 1108  Temp    Pulse 86 10/16/19 1109  Resp 15 10/16/19 1109  SpO2 95 % 10/16/19 1109  Vitals shown include unvalidated device data.  Last Pain:  Vitals:   10/16/19 1103  TempSrc:   PainSc: Asleep         Complications: No apparent anesthesia complications

## 2019-10-16 NOTE — B[facility] Group Notes (Signed)
Feelings Around Relapse 10/16/2019 1PM  Type of Therapy and Topic:  Group Therapy:  Feelings around Relapse and Recovery  Participation Level:  Did Not Attend   Description of Group:    Patients in this group will discuss emotions they experience before and after a relapse. They will process how experiencing these feelings, or avoidance of experiencing them, relates to having a relapse. Facilitator will guide patients to explore emotions they have related to recovery. Patients will be encouraged to process which emotions are more powerful. They will be guided to discuss the emotional reaction significant others in their lives may have to patients' relapse or recovery. Patients will be assisted in exploring ways to respond to the emotions of others without this contributing to a relapse.  Therapeutic Goals: 1. Patient will identify two or more emotions that lead to a relapse for them 2. Patient will identify two emotions that result when they relapse 3. Patient will identify two emotions related to recovery 4. Patient will demonstrate ability to communicate their needs through discussion and/or role plays   Summary of Patient Progress:     Therapeutic Modalities:   Cognitive Behavioral Therapy Solution-Focused Therapy Assertiveness Training Relapse Prevention Therapy   Yvette Rack, LCSW 10/16/2019 1:47 PM

## 2019-10-16 NOTE — H&P (Signed)
Ethan Osborn is an 54 y.o. male.   Chief Complaint: recurrent severe depression with suicidal thoughts HPI: see above. Recent worsening  Past Medical History:  Diagnosis Date  . Allergy   . Anxiety   . Arthritis    right big toe   . Sleep apnea    wears cpap     Past Surgical History:  Procedure Laterality Date  . OPEN REDUCTION SHOULDER DISLOCATION    . WISDOM TOOTH EXTRACTION      Family History  Problem Relation Age of Onset  . Depression Mother   . Hearing loss Mother   . Colon polyps Mother   . Arthritis Father   . Diabetes Father   . Hearing loss Father   . Heart disease Father   . Hyperlipidemia Father   . Kidney disease Father   . Colon polyps Father   . Depression Sister   . Stroke Maternal Grandfather   . Cancer Paternal Grandmother   . Cancer Paternal Grandfather   . Colon cancer Neg Hx   . Esophageal cancer Neg Hx   . Stomach cancer Neg Hx   . Rectal cancer Neg Hx    Social History:  reports that he has never smoked. He has never used smokeless tobacco. He reports previous alcohol use. He reports current drug use. Drug: Marijuana.  Allergies:  Allergies  Allergen Reactions  . Penicillins Hives    Shortness of breath    Medications Prior to Admission  Medication Sig Dispense Refill  . cholecalciferol (VITAMIN D3) 25 MCG (1000 UT) tablet Take 1,000 Units by mouth daily.    [name] Kitchen REXULTI 1 MG TABS Take 1 tablet by mouth at bedtime.    . Testosterone 20.25 MG/ACT (1.62%) GEL PLACE 3 PUMPS TO UPPER ARM AND SHOULDER ONCE A DAY 75 g 5  . TRINTELLIX 20 MG TABS tablet TAKE 1 TABLET BY MOUTH EVERY DAY. DISCONTINUE WELLBUTRIN (BUPROPION)      Results for orders placed or performed during the hospital encounter of 10/14/19 (from the past 48 hour(s))  CBC     Status: None   Collection Time: 10/15/19  6:22 AM  Result Value Ref Range   WBC 6.7 4.0 - 10.5 K/uL   RBC 5.39 4.22 - 5.81 MIL/uL   Hemoglobin 16.8 13.0 - 17.0 g/dL   HCT 48.3 39.0 - 52.0 %   MCV  89.6 80.0 - 100.0 fL   MCH 31.2 26.0 - 34.0 pg   MCHC 34.8 30.0 - 36.0 g/dL   RDW 12.7 11.5 - 15.5 %   Platelets 193 150 - 400 K/uL   nRBC 0.0 0.0 - 0.2 %    Comment: Performed at [facility], [address] [place], [place] [address]  Comprehensive metabolic panel     Status: Abnormal   Collection Time: 10/15/19  6:22 AM  Result Value Ref Range   Sodium 139 135 - 145 mmol/L   Potassium 4.0 3.5 - 5.1 mmol/L   Chloride 105 98 - 111 mmol/L   CO2 24 22 - 32 mmol/L   Glucose, Bld 191 (H) 70 - 99 mg/dL    Comment: Glucose reference range applies only to samples taken after fasting for at least 8 hours.   BUN 20 6 - 20 mg/dL   Creatinine, Ser 0.91 0.61 - 1.24 mg/dL   Calcium 9.3 8.9 - 10.3 mg/dL   Total Protein 6.5 6.5 - 8.1 g/dL   Albumin 4.0 3.5 - 5.0 g/dL   AST 32 15 -  41 U/L   ALT 55 (H) 0 - 44 U/L   Alkaline Phosphatase 52 38 - 126 U/L   Total Bilirubin 1.1 0.3 - 1.2 mg/dL   GFR calc non Af Amer >60 >60 mL/min   GFR calc Af Amer >60 >60 mL/min   Anion gap 10 5 - 15    Comment: Performed at [facility], [address] [place], [place] [address]  Ethanol     Status: None   Collection Time: 10/15/19  6:22 AM  Result Value Ref Range   Alcohol, Ethyl (B) <10 <10 mg/dL    Comment: (NOTE) Lowest detectable limit for serum alcohol is 10 mg/dL. For medical purposes only. Performed at [facility], [address] [place], [place] [address]   Hemoglobin A1c     Status: Abnormal   Collection Time: 10/15/19  6:22 AM  Result Value Ref Range   Hgb A1c MFr Bld 7.5 (H) 4.8 - 5.6 %    Comment: (NOTE) Pre diabetes:          5.7%-6.4% Diabetes:              >6.4% Glycemic control for   <7.0% adults with diabetes    Mean Plasma Glucose 168.55 mg/dL    Comment: Performed at [facility] [address] [place], [place] [address]  Lipid panel     Status: Abnormal   Collection Time: 10/15/19  6:22 AM  Result Value Ref Range   Cholesterol 247 (H)  0 - 200 mg/dL   Triglycerides 448 (H) <150 mg/dL   HDL 34 (L) >40 mg/dL   Total CHOL/HDL Ratio 7.3 RATIO   VLDL UNABLE TO CALCULATE IF TRIGLYCERIDE OVER 400 mg/dL 0 - 40 mg/dL   LDL Cholesterol UNABLE TO CALCULATE IF TRIGLYCERIDE OVER 400 mg/dL 0 - 99 mg/dL    Comment:        Total Cholesterol/HDL:CHD Risk Coronary Heart Disease Risk Table                     Men   Women  1/2 Average Risk   3.4   3.3  Average Risk       5.0   4.4  2 X Average Risk   9.6   7.1  3 X Average Risk  23.4   11.0        Use the calculated Patient Ratio above and the CHD Risk Table to determine the patient's CHD Risk.        ATP III CLASSIFICATION (LDL):  <100     mg/dL   Optimal  100-129  mg/dL   Near or Above                    Optimal  130-159  mg/dL   Borderline  160-189  mg/dL   High  >190     mg/dL   Very High Performed at [facility], [address] [place], [place] [address]   Lithium level     Status: Abnormal   Collection Time: 10/15/19  6:22 AM  Result Value Ref Range   Lithium Lvl <0.06 (L) 0.60 - 1.20 mmol/L    Comment: Performed at [facility], [address] [place], [place] [address]  Prolactin     Status: None   Collection Time: 10/15/19  6:22 AM  Result Value Ref Range   Prolactin 10.4 4.0 - 15.2 ng/mL    Comment: (NOTE) Performed At: [facility]  [facility] [place], [place] [id number] Rush Farmer MD [id number]   TSH     Status: None   Collection Time: 10/15/19  6:22 AM  Result Value Ref Range   TSH 3.883 0.350 - 4.500 uIU/mL    Comment: Performed by a 3rd Generation assay with a functional sensitivity of <=0.01 uIU/mL. Performed at [facility], [place], [place] [address]   Testosterone     Status: None   Collection Time: 10/15/19  6:22 AM  Result Value Ref Range   Testosterone 411 264 - 916 ng/dL    Comment: (NOTE) Adult male reference interval is based on a population of healthy nonobese males (BMI  <30) between 6 and 32 years old. [place], [place] [phone]. PMID: [id number]. Performed At: [facility] [place], [place] [id number] Rush Farmer MD [id number]   LDL cholesterol, direct     Status: Abnormal   Collection Time: 10/15/19  6:22 AM  Result Value Ref Range   Direct LDL 114.5 (H) 0 - 99 mg/dL    Comment: Performed at [facility] [address] [place], [place] [address]   No results found.  Review of Systems  Constitutional: Negative.   HENT: Negative.   Eyes: Negative.   Respiratory: Negative.   Cardiovascular: Negative.   Gastrointestinal: Negative.   Musculoskeletal: Negative.   Skin: Negative.   Neurological: Negative.   Psychiatric/Behavioral: Positive for dysphoric mood and suicidal ideas.    Blood pressure 119/77, pulse (!) 56, temperature 98.2 F (36.8 C), temperature source Oral, resp. rate 16, height 5\' 11"  (1.803 m), weight 105.5 kg, SpO2 95 %. Physical Exam  Nursing note and vitals reviewed. Constitutional: He appears well-developed and well-nourished.  HENT:  Head: Normocephalic and atraumatic.  Eyes: Pupils are equal, round, and reactive to light. Conjunctivae are normal.  Cardiovascular: Normal heart sounds.  Respiratory: Effort normal.  GI: Soft.  Musculoskeletal:        General: Normal range of motion.     Cervical back: Normal range of motion.  Neurological: He is alert.  Skin: Skin is warm and dry.  Psychiatric: His speech is normal and behavior is normal. Judgment normal. Cognition and memory are normal. He exhibits a depressed mood. He expresses suicidal ideation. He expresses suicidal plans.     Assessment/Plan Start RUL ECT  Alethia Berthold, MD 10/16/2019, 9:41 AM

## 2019-10-16 NOTE — Progress Notes (Addendum)
Per Sharl Ma, RN, she has called report to patient's nurse, GiGi. No need for this nurse to call report.

## 2019-10-17 DIAGNOSIS — F332 Major depressive disorder, recurrent severe without psychotic features: Secondary | ICD-10-CM

## 2019-10-17 MED ORDER — ATORVASTATIN CALCIUM 20 MG PO TABS
10.0000 mg | ORAL_TABLET | Freq: Every day | ORAL | Status: DC
  Administered 2019-10-17 – 2019-10-18 (×2): 10 mg via ORAL
  Filled 2019-10-17 (×2): qty 1

## 2019-10-17 NOTE — Discharge Summary (Signed)
Patient presented as walk-in to [facility] for depression with SI. He is being admitted to [facility] for potential ECT treatment- see MSE and TTS notes.

## 2019-10-17 NOTE — Progress Notes (Signed)
Patient alert and oriented x 4, affect is flat and sad , he denies SI/HI/AVH , S/P ECT patient is noted asleep, respiration even non labored no distress noted, 15 minutes safety checks maintained will continue to monitor.

## 2019-10-17 NOTE — Progress Notes (Signed)
[facility] MD Progress Note  10/17/2019 11:19 AM Dayvion Sluis  MRN:  [id number] Subjective: Patient is a 54 year old male with a past psychiatric history significant for depression who was admitted on 10/15/2019 for ECT.  Objective: Patient is seen and examined.  Patient is a 54 year old male with the above-stated past psychiatric history is seen in follow-up.  Patient was admitted for ECT treatment.  He received his first treatment on 10/16/2019.  This went well.  He stated he tolerated it without difficulty.  He stated his mood was improving, and he denied suicidal ideation today.  His current medications include olanzapine/fluoxetine 6/25 p.o. nightly.  Review of his admission laboratories revealed essentially normal electrolytes, and elevated total cholesterol at 247 and triglycerides at 448.  Normal CBC.  He had a lithium level done on 3/4 which showed the lithium less than 0.06.  His testosterone was 411.  TSH was 3.883.  His vital signs are stable.  His heart rate was 58.  He is afebrile.  He slept 9 hours last night.  Principal Problem: MDD (major depressive disorder), recurrent episode, severe ([place]) Diagnosis: Principal Problem:   MDD (major depressive disorder), recurrent episode, severe ([place])  Total Time spent with patient: 20 minutes  Past Psychiatric History: See admission H&P  Past Medical History:  Past Medical History:  Diagnosis Date  . Allergy   . Anxiety   . Arthritis    right big toe   . Sleep apnea    wears cpap     Past Surgical History:  Procedure Laterality Date  . OPEN REDUCTION SHOULDER DISLOCATION    . WISDOM TOOTH EXTRACTION     Family History:  Family History  Problem Relation Age of Onset  . Depression Mother   . Hearing loss Mother   . Colon polyps Mother   . Arthritis Father   . Diabetes Father   . Hearing loss Father   . Heart disease Father   . Hyperlipidemia Father   . Kidney disease Father   . Colon polyps Father   . Depression Sister   . Stroke  Maternal Grandfather   . Cancer Paternal Grandmother   . Cancer Paternal Grandfather   . Colon cancer Neg Hx   . Esophageal cancer Neg Hx   . Stomach cancer Neg Hx   . Rectal cancer Neg Hx    Family Psychiatric  History: See admission H&P Social History:  Social History   Substance and Sexual Activity  Alcohol Use Not Currently   Comment: social      Social History   Substance and Sexual Activity  Drug Use Yes  . Types: Marijuana   Comment: twice monthly    Social History   Socioeconomic History  . Marital status: Married    Spouse name: Not on file  . Number of children: Not on file  . Years of education: Not on file  . Highest education level: Not on file  Occupational History  . Occupation: Armed forces technical officer   Tobacco Use  . Smoking status: Never Smoker  . Smokeless tobacco: Never Used  Substance and Sexual Activity  . Alcohol use: Not Currently    Comment: social   . Drug use: Yes    Types: Marijuana    Comment: twice monthly  . Sexual activity: Yes  Other Topics Concern  . Not on file  Social History Narrative  . Not on file   Social Determinants of Health   Financial Resource Strain:   . Difficulty of Paying  Living Expenses: Not on file  Food Insecurity:   . Worried About Charity fundraiser in the Last Year: Not on file  . Ran Out of Food in the Last Year: Not on file  Transportation Needs:   . Lack of Transportation (Medical): Not on file  . Lack of Transportation (Non-Medical): Not on file  Physical Activity:   . Days of Exercise per Week: Not on file  . Minutes of Exercise per Session: Not on file  Stress:   . Feeling of Stress : Not on file  Social Connections:   . Frequency of Communication with Friends and Family: Not on file  . Frequency of Social Gatherings with Friends and Family: Not on file  . Attends Religious Services: Not on file  . Active Member of Clubs or Organizations: Not on file  . Attends Archivist Meetings:  Not on file  . Marital Status: Not on file   Additional Social History:                         Sleep: Good  Appetite:  Fair  Current Medications: Current Facility-Administered Medications  Medication Dose Route Frequency Provider Last Rate Last Admin  . acetaminophen (TYLENOL) tablet 650 mg  650 mg Oral Q6H PRN Connye Burkitt, NP      . alum & mag hydroxide-simeth (MAALOX/MYLANTA) 200-200-20 MG/5ML suspension 30 mL  30 mL Oral Q4H PRN Connye Burkitt, NP      . hydrOXYzine (ATARAX/VISTARIL) tablet 25 mg  25 mg Oral TID PRN Connye Burkitt, NP      . magnesium hydroxide (MILK OF MAGNESIA) suspension 30 mL  30 mL Oral Daily PRN Connye Burkitt, NP      . OLANZapine-FLUoxetine (SYMBYAX) 6-25 MG per capsule 1 capsule  1 capsule Oral QHS Connye Burkitt, NP      . ondansetron (ZOFRAN) injection 4 mg  4 mg Intravenous Once PRN Gunnar Fusi, MD      . testosterone (ANDROGEL) 50 MG/5GM (1%) gel 4 g  4 g Transdermal QHS Connye Burkitt, NP      . traZODone (DESYREL) tablet 50 mg  50 mg Oral QHS PRN Connye Burkitt, NP   50 mg at 10/15/19 2141    Lab Results: No results found for this or any previous visit (from the past 48 hour(s)).  Blood Alcohol level:  Lab Results  Component Value Date   ETH <10 0000000    Metabolic Disorder Labs: Lab Results  Component Value Date   HGBA1C 7.5 (H) 10/15/2019   MPG 168.55 10/15/2019   Lab Results  Component Value Date   PROLACTIN 10.4 10/15/2019   Lab Results  Component Value Date   CHOL 247 (H) 10/15/2019   TRIG 448 (H) 10/15/2019   HDL 34 (L) 10/15/2019   CHOLHDL 7.3 10/15/2019   VLDL UNABLE TO CALCULATE IF TRIGLYCERIDE OVER 400 mg/dL 10/15/2019   LDLCALC UNABLE TO CALCULATE IF TRIGLYCERIDE OVER 400 mg/dL 10/15/2019    Physical Findings: AIMS: Facial and Oral Movements Muscles of Facial Expression: None, normal Lips and Perioral Area: None, normal Jaw: None, normal Tongue: None, normal,Extremity Movements Upper (arms,  wrists, hands, fingers): None, normal Lower (legs, knees, ankles, toes): None, normal, Trunk Movements Neck, shoulders, hips: None, normal, Overall Severity Severity of abnormal movements (highest score from questions above): None, normal Incapacitation due to abnormal movements: None, normal Patient's awareness of abnormal movements (rate only patient's  report): No Awareness, Dental Status Current problems with teeth and/or dentures?: No Does patient usually wear dentures?: No  CIWA:    COWS:     Musculoskeletal: Strength & Muscle Tone: within normal limits Gait & Station: normal Patient leans: N/A  Psychiatric Specialty Exam: Physical Exam  Constitutional: He is oriented to person, place, and time. He appears well-developed and well-nourished.  HENT:  Head: Normocephalic and atraumatic.  Respiratory: Effort normal.  Neurological: He is alert and oriented to person, place, and time.    Review of Systems  Blood pressure 120/73, pulse (!) 58, temperature 98.6 F (37 C), temperature source Oral, resp. rate 18, height 5\' 11"  (1.803 m), weight 104.8 kg, SpO2 94 %.Body mass index is 32.22 kg/m.  General Appearance: Casual  Eye Contact:  Fair  Speech:  Normal Rate  Volume:  Decreased  Mood:  Depressed  Affect:  Congruent  Thought Process:  Coherent and Descriptions of Associations: Intact  Orientation:  Full (Time, Place, and Person)  Thought Content:  Logical  Suicidal Thoughts:  No  Homicidal Thoughts:  No  Memory:  Immediate;   Fair Recent;   Fair Remote;   Fair  Judgement:  Intact  Insight:  Fair  Psychomotor Activity:  Normal  Concentration:  Concentration: Fair and Attention Span: Fair  Recall:  [employer] of Knowledge:  Fair  Language:  Good  Akathisia:  Negative  Handed:  Right  AIMS (if indicated):     Assets:  Desire for Improvement Resilience  ADL's:  Intact  Cognition:  WNL  Sleep:  Number of Hours: 9     Treatment Plan Summary: Daily contact with  patient to assess and evaluate symptoms and progress in treatment, Medication management and Plan : Patient is seen and examined.  Patient is a 54 year old male with the above-stated past psychiatric history who is seen in follow-up.   Diagnosis: #1 major depression, recurrent, severe without psychotic features, #2 hyperlipidemia  Patient is seen in follow-up.  He is received his first treatment of ECT on 10/16/2019.  He is scheduled for his second treatment on 10/19/2019.  We will continue our current treatment as is.  1.  Continue olanzapine/fluoxetine combination drug of 6 mg / 25 mg p.o. nightly for mood and mood stability. 2.  Continue testosterone gel nightly for hypogonadism. 3.  Continue trazodone 50 mg p.o. nightly as needed insomnia. 4.  Start Lipitor 10 mg p.o. every afternoon for hyperlipidemia. 5.  Continue ECT treatment with next treatment on 10/19/2019. 6.  Disposition planning-in progress.  Sharma Covert, MD 10/17/2019, 11:19 AM

## 2019-10-17 NOTE — Plan of Care (Signed)
Patient denies SI/HI/AVH. Patient reports improving mood. Patient is seen in milieu during meal times, isolates to self. Patient is appropriate during assessment.   Problem: Education: Goal: Knowledge of [facility] Education information/materials will improve Outcome: Progressing Goal: Mental status will improve Outcome: Progressing Goal: Verbalization of understanding the information provided will improve Outcome: Progressing

## 2019-10-17 NOTE — BHH Group Notes (Signed)
LCSW Group Therapy Notes  Date and Time: 10/17/2019 1:00PM  Type of Therapy and Topic: Group Therapy: Healthy Vs. Unhealthy Coping Strategies  Participation Level: BHH PARTICIPATION LEVEL: Active  Description of Group:  In this group, patients will be encouraged to explore their healthy and unhealthy coping strategics. Coping strategies are actions that we take to deal with stress, problems, or uncomfortable emotions in our daily lives. Each patient will be challenged to read some scenarios and discuss the unhealthy and healthy coping strategies within those scenarios. Also, each patient will be challenged to describe current healthy and unhealthy strategies that they use in their own lives and discuss the outcomes and barriers to those strategies. This group will be process-oriented, with patients participating in exploration of their own experiences as well as giving and receiving support and challenge from other group members.  Therapeutic Goals: 1. Patient will identify personal healthy and unhealthy coping strategies. 2. Patient will identify healthy and unhealthy coping strategies, in others, through scenarios.  3. Patient will identify expected outcomes of healthy and unhealthy coping strategies. 4. Patient will identify barriers to using healthy coping strategies.   Summary of Patient Progress:  The patient stated that current healthy coping strategies  in his life are walking his dog daily and journaling while current unhealthy strategies include drinking alocohol.  The patient expressed a willingness to add managing his time better as a strategy to help in his recovery journey.   Therapeutic Modalities:  Cognitive Behavioral Therapy Solution Focused Therapy Motivational Interviewing   Juanetta Beets, MSW, [place] Social Worker

## 2019-10-17 NOTE — BHH Group Notes (Signed)
[facility] Group Notes:  (Nursing/MHT/Case Management/Adjunct)  Date:  10/17/2019  Time:  10:49 PM  Type of Therapy:  Group Therapy  Participation Level:  Active  Participation Quality:  Appropriate  Affect:  Appropriate  Cognitive:  Alert  Insight:  Good  Engagement in Group:  Engaged  Modes of Intervention:  Support  Summary of Progress/Problems:  Ethan Osborn 10/17/2019, 10:49 PM

## 2019-10-18 DIAGNOSIS — F332 Major depressive disorder, recurrent severe without psychotic features: Secondary | ICD-10-CM | POA: Diagnosis not present

## 2019-10-18 MED ORDER — OLANZAPINE 5 MG PO TABS
5.0000 mg | ORAL_TABLET | Freq: Every day | ORAL | Status: DC
  Administered 2019-10-18: 5 mg via ORAL
  Filled 2019-10-18: qty 1

## 2019-10-18 MED ORDER — FLUOXETINE HCL 20 MG PO CAPS
20.0000 mg | ORAL_CAPSULE | Freq: Every day | ORAL | Status: DC
  Administered 2019-10-18 – 2019-10-19 (×2): 20 mg via ORAL
  Filled 2019-10-18 (×2): qty 1

## 2019-10-18 NOTE — Progress Notes (Signed)
D: Patient is pleasant and cooperative. Denies SI HI and AVH. Symbyax and Androgel are not carried by pharmacy, so they were not given.  A: Continue to monitor for safety R: safety maintained.

## 2019-10-18 NOTE — BHH Group Notes (Signed)
[facility] Group Notes:  (Nursing/MHT/Case Management/Adjunct)  Date:  10/18/2019  Time:  5:33 PM  Type of Therapy:  Community Meeting  Participation Level:  Did Not Attend   Summary of Progress/Problems:  Charna Busman 10/18/2019, 5:33 PM

## 2019-10-18 NOTE — BHH Group Notes (Signed)
LCSW Group Therapy Note  10/18/2019 1:00PM  Type of Therapy/Topic:  Group Therapy:  Balance in Life  Participation Level:  Active  Description of Group:    This group will address the concept of balance and how it feels and looks when one is unbalanced. Patients will be encouraged to process areas in their lives that are out of balance and identify reasons for remaining unbalanced. Facilitators will guide patients in utilizing problem-solving interventions to address and correct the stressor making their life unbalanced. Understanding and applying boundaries will be explored and addressed for obtaining and maintaining a balanced life. Patients will be encouraged to explore ways to assertively make their unbalanced needs known to significant others in their lives, using other group members and facilitator for support and feedback.  Therapeutic Goals: 1. Patient will identify two or more emotions or situations they have that consume much of in their lives. 2. Patient will identify signs/triggers that life has become out of balance:  3. Patient will identify two ways to set boundaries in order to achieve balance in their lives:  4. Patient will demonstrate ability to communicate their needs through discussion and/or role plays  Summary of Patient Progress:  The pt was able to identify multiple areas in his life that are going "well" by completing the wellness wheel. The pt stated this activity helped him to see that more things were "going right" The pt reports he grateful for his family for being so supportive as deals with his mental health challenges. The pt also stated he was grateful for the staff for being "so nice and supportive."    Therapeutic Modalities:   Cognitive Behavioral Therapy Solution-Focused Therapy Assertiveness Training  Juanetta Beets MSW, [place] Work 10/18/2019 11:59 AM

## 2019-10-18 NOTE — Plan of Care (Signed)
  Problem: Education: Goal: Knowledge of [facility] Education information/materials will improve Outcome: Progressing Goal: Emotional status will improve Outcome: Progressing Goal: Mental status will improve Outcome: Progressing Goal: Verbalization of understanding the information provided will improve Outcome: Progressing  D: Patient is pleasant and cooperative. Denies SI HI and AVH. Symbyax and Androgel are not carried by pharmacy, so they were not given.  A: Continue to monitor for safety R: safety maintained.

## 2019-10-18 NOTE — Progress Notes (Signed)
[facility] MD Progress Note  10/18/2019 12:38 PM Ethan Osborn  MRN:  [id number] Subjective:  Patient is a 54 year old male with a past psychiatric history significant for depression who was admitted on 10/15/2019 for ECT.   Objective: Patient is seen and examined.  Patient is a 54 year old male with the above-stated past psychiatric history who is seen in follow-up.  There was some confusion on his medications yesterday.  The patient on admission had been placed on Symbyax which is a combination drug of fluoxetine and olanzapine.  Pharmacy stated that they were unable to have this medication, but did not substitute the generics.  I discussed that with the patient today, and he had previously been on Symbyax 6 mg / 25 mg nightly.  I started him back on olanzapine 5 mg p.o. nightly and fluoxetine 20 mg p.o. daily which is essentially the same dosage.  He is scheduled for ECT tomorrow.  He denied any suicidal ideation.  His depression is still present.  His vital signs are stable, he is afebrile.  He slept 7.25 hours last night.  No new laboratories.  Principal Problem: MDD (major depressive disorder), recurrent episode, severe ([place]) Diagnosis: Principal Problem:   MDD (major depressive disorder), recurrent episode, severe ([place])  Total Time spent with patient: 15 minutes  Past Psychiatric History: See admission H&P  Past Medical History:  Past Medical History:  Diagnosis Date  . Allergy   . Anxiety   . Arthritis    right big toe   . Sleep apnea    wears cpap     Past Surgical History:  Procedure Laterality Date  . OPEN REDUCTION SHOULDER DISLOCATION    . WISDOM TOOTH EXTRACTION     Family History:  Family History  Problem Relation Age of Onset  . Depression Mother   . Hearing loss Mother   . Colon polyps Mother   . Arthritis Father   . Diabetes Father   . Hearing loss Father   . Heart disease Father   . Hyperlipidemia Father   . Kidney disease Father   . Colon polyps Father   .  Depression Sister   . Stroke Maternal Grandfather   . Cancer Paternal Grandmother   . Cancer Paternal Grandfather   . Colon cancer Neg Hx   . Esophageal cancer Neg Hx   . Stomach cancer Neg Hx   . Rectal cancer Neg Hx    Family Psychiatric  History: See admission H&P Social History:  Social History   Substance and Sexual Activity  Alcohol Use Not Currently   Comment: social      Social History   Substance and Sexual Activity  Drug Use Yes  . Types: Marijuana   Comment: twice monthly    Social History   Socioeconomic History  . Marital status: Married    Spouse name: Not on file  . Number of children: Not on file  . Years of education: Not on file  . Highest education level: Not on file  Occupational History  . Occupation: Armed forces technical officer   Tobacco Use  . Smoking status: Never Smoker  . Smokeless tobacco: Never Used  Substance and Sexual Activity  . Alcohol use: Not Currently    Comment: social   . Drug use: Yes    Types: Marijuana    Comment: twice monthly  . Sexual activity: Yes  Other Topics Concern  . Not on file  Social History Narrative  . Not on file   Social Determinants of Health  Financial Resource Strain:   . Difficulty of Paying Living Expenses: Not on file  Food Insecurity:   . Worried About Charity fundraiser in the Last Year: Not on file  . Ran Out of Food in the Last Year: Not on file  Transportation Needs:   . Lack of Transportation (Medical): Not on file  . Lack of Transportation (Non-Medical): Not on file  Physical Activity:   . Days of Exercise per Week: Not on file  . Minutes of Exercise per Session: Not on file  Stress:   . Feeling of Stress : Not on file  Social Connections:   . Frequency of Communication with Friends and Family: Not on file  . Frequency of Social Gatherings with Friends and Family: Not on file  . Attends Religious Services: Not on file  . Active Member of Clubs or Organizations: Not on file  . Attends  Archivist Meetings: Not on file  . Marital Status: Not on file   Additional Social History:                         Sleep: Good  Appetite:  Fair  Current Medications: Current Facility-Administered Medications  Medication Dose Route Frequency Provider Last Rate Last Admin  . acetaminophen (TYLENOL) tablet 650 mg  650 mg Oral Q6H PRN Connye Burkitt, NP      . alum & mag hydroxide-simeth (MAALOX/MYLANTA) 200-200-20 MG/5ML suspension 30 mL  30 mL Oral Q4H PRN Connye Burkitt, NP      . atorvastatin (LIPITOR) tablet 10 mg  10 mg Oral q1800 Sharma Covert, MD   10 mg at 10/17/19 1814  . FLUoxetine (PROZAC) capsule 20 mg  20 mg Oral Daily Sharma Covert, MD   20 mg at 10/18/19 0909  . hydrOXYzine (ATARAX/VISTARIL) tablet 25 mg  25 mg Oral TID PRN Connye Burkitt, NP   25 mg at 10/17/19 2133  . magnesium hydroxide (MILK OF MAGNESIA) suspension 30 mL  30 mL Oral Daily PRN Connye Burkitt, NP      . OLANZapine (ZYPREXA) tablet 5 mg  5 mg Oral QHS Sharma Covert, MD      . ondansetron [facility]) injection 4 mg  4 mg Intravenous Once PRN Gunnar Fusi, MD      . testosterone (ANDROGEL) 50 MG/5GM (1%) gel 4 g  4 g Transdermal QHS Connye Burkitt, NP      . traZODone (DESYREL) tablet 50 mg  50 mg Oral QHS PRN Connye Burkitt, NP   50 mg at 10/17/19 2102    Lab Results: No results found for this or any previous visit (from the past 70 hour(s)).  Blood Alcohol level:  Lab Results  Component Value Date   ETH <10 0000000    Metabolic Disorder Labs: Lab Results  Component Value Date   HGBA1C 7.5 (H) 10/15/2019   MPG 168.55 10/15/2019   Lab Results  Component Value Date   PROLACTIN 10.4 10/15/2019   Lab Results  Component Value Date   CHOL 247 (H) 10/15/2019   TRIG 448 (H) 10/15/2019   HDL 34 (L) 10/15/2019   CHOLHDL 7.3 10/15/2019   VLDL UNABLE TO CALCULATE IF TRIGLYCERIDE OVER 400 mg/dL 10/15/2019   LDLCALC UNABLE TO CALCULATE IF TRIGLYCERIDE OVER 400  mg/dL 10/15/2019    Physical Findings: AIMS: Facial and Oral Movements Muscles of Facial Expression: None, normal Lips and Perioral Area: None, normal Jaw:  None, normal Tongue: None, normal,Extremity Movements Upper (arms, wrists, hands, fingers): None, normal Lower (legs, knees, ankles, toes): None, normal, Trunk Movements Neck, shoulders, hips: None, normal, Overall Severity Severity of abnormal movements (highest score from questions above): None, normal Incapacitation due to abnormal movements: None, normal Patient's awareness of abnormal movements (rate only patient's report): No Awareness, Dental Status Current problems with teeth and/or dentures?: No Does patient usually wear dentures?: No  CIWA:    COWS:     Musculoskeletal: Strength & Muscle Tone: within normal limits Gait & Station: normal Patient leans: N/A  Psychiatric Specialty Exam: Physical Exam  Nursing note and vitals reviewed. Constitutional: He appears well-developed and well-nourished.  HENT:  Head: Normocephalic and atraumatic.  Respiratory: Effort normal.  Neurological: He is alert.    Review of Systems  Blood pressure 113/78, pulse 60, temperature 98.3 F (36.8 C), temperature source Oral, resp. rate 20, height 5\' 11"  (1.803 m), weight 104.8 kg, SpO2 95 %.Body mass index is 32.22 kg/m.  General Appearance: Casual  Eye Contact:  Fair  Speech:  Normal Rate  Volume:  Decreased  Mood:  Depressed  Affect:  Congruent  Thought Process:  Coherent and Descriptions of Associations: Intact  Orientation:  Full (Time, Place, and Person)  Thought Content:  Logical  Suicidal Thoughts:  No  Homicidal Thoughts:  No  Memory:  Immediate;   Fair Recent;   Fair Remote;   Fair  Judgement:  Intact  Insight:  Fair  Psychomotor Activity:  Psychomotor Retardation  Concentration:  Concentration: Fair and Attention Span: Fair  Recall:  [employer] of Knowledge:  Fair  Language:  Good  Akathisia:  Negative   Handed:  Right  AIMS (if indicated):     Assets:  Desire for Improvement Financial Resources/Insurance Housing Resilience Social Support  ADL's:  Intact  Cognition:  WNL  Sleep:  Number of Hours: 7.75     Treatment Plan Summary: Daily contact with patient to assess and evaluate symptoms and progress in treatment, Medication management and Plan : Patient is seen and examined.  Patient is a 54 year old male with the above-stated past psychiatric history who is seen in follow-up.  Patient is a 54 year old male with the above-stated past psychiatric history who is seen in follow-up.   Diagnosis: #1 major depression, recurrent, severe without psychotic features, #2 hyperlipidemia  Patient is seen in follow-up.  He is scheduled for his second ECT treatment on 10/19/2019.  I have changed his medications to the generics of fluoxetine 20 mg p.o. daily and olanzapine 5 mg p.o. nightly.  They apparently do not also have the testosterone gel, and I think that is fine for now.  He was started on Lipitor for his hyperlipidemia.  His TSH is essentially in the normal range.  No other changes in his medications at this point.  1.  Continue Lipitor 10 mg p.o. every afternoon for hyperlipidemia. 2.  Start fluoxetine 20 mg p.o. daily for depression and anxiety. 3.  Continue hydroxyzine 25 mg p.o. 3 times daily as needed anxiety. 4.  Start olanzapine 5 mg p.o. nightly for mood stability, sleep and psychosis. 5.  Testosterone gel is not available at this hospital.  He will be held. 6.  Continue trazodone 50 mg p.o. nightly as needed insomnia. 7.  Disposition planning-in progress.  Sharma Covert, MD 10/18/2019, 12:38 PM

## 2019-10-18 NOTE — Plan of Care (Signed)
Patient rated his depression 0/10 and anxiety 1/10 and his goal for today is stay positive as per the self inventory.Patient is visible in the milieu and interacting with peers.Denies SI,HI and AVH.Attended groups.Compliant with medications.Appetite and energy level good.Support and encouragement given.

## 2019-10-19 ENCOUNTER — Encounter: Payer: Self-pay | Admitting: Psychiatric/Mental Health

## 2019-10-19 ENCOUNTER — Inpatient Hospital Stay: Payer: BC Managed Care – PPO | Admitting: Certified Registered Nurse Anesthetist

## 2019-10-19 LAB — GLUCOSE, CAPILLARY: Glucose-Capillary: 198 mg/dL — ABNORMAL HIGH (ref 70–99)

## 2019-10-19 MED ORDER — SODIUM CHLORIDE 0.9 % IV SOLN
500.0000 mL | Freq: Once | INTRAVENOUS | Status: AC
  Administered 2019-10-19: 500 mL via INTRAVENOUS

## 2019-10-19 MED ORDER — GLYCOPYRROLATE 0.2 MG/ML IJ SOLN
INTRAMUSCULAR | Status: AC
  Filled 2019-10-19: qty 1

## 2019-10-19 MED ORDER — MIDAZOLAM HCL 2 MG/2ML IJ SOLN: 4.0000 mg | Freq: Once | INTRAMUSCULAR | Status: DC

## 2019-10-19 MED ORDER — SUCCINYLCHOLINE CHLORIDE 20 MG/ML IJ SOLN
INTRAMUSCULAR | Status: AC
  Filled 2019-10-19: qty 1

## 2019-10-19 MED ORDER — HALOPERIDOL LACTATE 5 MG/ML IJ SOLN
INTRAMUSCULAR | Status: DC | PRN
  Administered 2019-10-19: 5 mg via INTRAVENOUS

## 2019-10-19 MED ORDER — SODIUM CHLORIDE 0.9 % IV SOLN: INTRAVENOUS | Status: DC | PRN

## 2019-10-19 MED ORDER — TRAZODONE HCL 50 MG PO TABS: 50.0000 mg | ORAL_TABLET | Freq: Every evening | ORAL | 1 refills | Status: DC | PRN

## 2019-10-19 MED ORDER — SUCCINYLCHOLINE CHLORIDE 20 MG/ML IJ SOLN
INTRAMUSCULAR | Status: DC | PRN
  Administered 2019-10-19: 150 mg via INTRAVENOUS

## 2019-10-19 MED ORDER — HALOPERIDOL LACTATE 5 MG/ML IJ SOLN
INTRAMUSCULAR | Status: AC
  Filled 2019-10-19: qty 1

## 2019-10-19 MED ORDER — METHOHEXITAL SODIUM 100 MG/10ML IV SOSY
PREFILLED_SYRINGE | INTRAVENOUS | Status: DC | PRN
  Administered 2019-10-19: 100 mg via INTRAVENOUS

## 2019-10-19 MED ORDER — FENTANYL CITRATE (PF) 100 MCG/2ML IJ SOLN: 25.0000 ug | INTRAMUSCULAR | Status: DC | PRN

## 2019-10-19 MED ORDER — TESTOSTERONE 50 MG/5GM (1%) TD GEL: 4.0000 g | Freq: Every day | TRANSDERMAL | 1 refills | Status: DC

## 2019-10-19 MED ORDER — ATORVASTATIN CALCIUM 10 MG PO TABS: 10.0000 mg | ORAL_TABLET | Freq: Every day | ORAL | 1 refills | Status: DC

## 2019-10-19 MED ORDER — GLYCOPYRROLATE 0.2 MG/ML IJ SOLN
0.2000 mg | Freq: Once | INTRAMUSCULAR | Status: AC
  Administered 2019-10-19: 0.2 mg via INTRAVENOUS

## 2019-10-19 MED ORDER — OLANZAPINE 5 MG PO TABS: 5.0000 mg | ORAL_TABLET | Freq: Every day | ORAL | 1 refills | Status: DC

## 2019-10-19 MED ORDER — FLUOXETINE HCL 20 MG PO CAPS: 20.0000 mg | ORAL_CAPSULE | Freq: Every day | ORAL | 1 refills | Status: DC

## 2019-10-19 MED ORDER — ONDANSETRON HCL 4 MG/2ML IJ SOLN: 4.0000 mg | Freq: Once | INTRAMUSCULAR | Status: DC | PRN

## 2019-10-19 MED ORDER — MIDAZOLAM HCL 2 MG/2ML IJ SOLN
INTRAMUSCULAR | Status: AC
  Filled 2019-10-19: qty 4

## 2019-10-19 MED ORDER — MIDAZOLAM HCL 2 MG/2ML IJ SOLN
INTRAMUSCULAR | Status: DC | PRN
  Administered 2019-10-19: 4 mg via INTRAVENOUS

## 2019-10-19 NOTE — [facility] Group Notes (Signed)
LCSW Group Therapy Note   10/19/2019 1:57 PM   Type of Therapy and Topic:  Group Therapy:  Overcoming Obstacles   Participation Level:  Did Not Attend   Description of Group:    In this group patients will be encouraged to explore what they see as obstacles to their own wellness and recovery. They will be guided to discuss their thoughts, feelings, and behaviors related to these obstacles. The group will process together ways to cope with barriers, with attention given to specific choices patients can make. Each patient will be challenged to identify changes they are motivated to make in order to overcome their obstacles. This group will be process-oriented, with patients participating in exploration of their own experiences as well as giving and receiving support and challenge from other group members.   Therapeutic Goals: 1. Patient will identify personal and current obstacles as they relate to admission. 2. Patient will identify barriers that currently interfere with their wellness or overcoming obstacles.  3. Patient will identify feelings, thought process and behaviors related to these barriers. 4. Patient will identify two changes they are willing to make to overcome these obstacles:      Summary of Patient Progress x     Therapeutic Modalities:   Cognitive Behavioral Therapy Solution Focused Therapy Motivational Interviewing Relapse Prevention Therapy  Evalina Field, MSW, LCSW Clinical Social Work 10/19/2019 1:57 PM

## 2019-10-19 NOTE — Discharge Instructions (Signed)
1)  The drugs that you have been given will stay in your system until tomorrow so for the       next 24 hours you should not:  A. Drive an automobile  B. Make any legal decisions  C. Drink any alcoholic beverages  2)  You may resume your regular meals upon return home.  3)  A responsible adult must take you home.  Someone should stay with you for a few          hours, then be available by phone for the remainder of the treatment day.  4)  You May experience any of the following symptoms:  Headache, Nausea and a dry mouth (due to the medications you were given),  temporary memory loss and some confusion, or sore muscles (a warm bath  should help this).  If you you experience any of these symptoms let us know on                your return visit.  5)  Report any of the following: any acute discomfort, severe headache, or temperature        greater than 100.5 F.   Also report any unusual redness, swelling, drainage, or pain         at your IV site.    You may report Symptoms to:  [facility]          Phone: [phone], ECT Department           or Dr. Prescott Gum office [phone]  6)  Your next ECT Treatment is Wednesday March 10 at 8:15  We will call 2 days prior to your scheduled appointment for arrival times.  7)  Nothing to eat or drink after midnight the night before your procedure.  8)  Take      With a sip of water the morning of your procedure.  9)  Other Instructions: Call [phone] to cancel the morning of your procedure due         to illness or emergency.  10) We will call within 72 hours to assess how you are feeling.

## 2019-10-19 NOTE — Progress Notes (Signed)
  [facility] Adult Case Management Discharge Plan :  Will you be returning to the same living situation after discharge:  Yes,  pt reports he is returning home. At discharge, do you have transportation home?: Yes,  wife will provide transportation. Do you have the ability to pay for your medications: Yes,  BCBS  Release of information consent forms completed and in the chart;  Patient's signature needed at discharge.  Patient to Follow up at: Follow-up Information    Chucky May, MD Follow up on 10/26/2019.   Specialty: Psychiatry Why: Follow up appointment has been scheduled for 11:45 on 10/26/2019.  Appointment is  virtual, they will call two days before the appointment to confirm.  Thanks! Contact information: [address] [place] [address] [phone]        [facility] Follow up.   Why: Appointment is scheduled 10/21/2019 at 5:15pm.  Appointment is via [facility].  Thanks! Contact information: [address], [place] [address]  [phone]          Next level of care provider has access to [place] and Suicide Prevention discussed: Yes,  SPE completed with pt's wife.  Have you used any form of tobacco in the last 30 days? (Cigarettes, Smokeless Tobacco, Cigars, and/or Pipes): No  Has patient been referred to the Quitline?: Patient refused referral  Patient has been referred for addiction treatment: Pt. refused referral  [name], LCSW 10/19/2019, 12:02 PM

## 2019-10-19 NOTE — [facility] Suicide Risk Assessment (Signed)
[facility] Discharge Suicide Risk Assessment   Principal Problem: MDD (major depressive disorder), recurrent episode, severe ([place]) Discharge Diagnoses: Principal Problem:   MDD (major depressive disorder), recurrent episode, severe ([place])   Total Time spent with patient: 30 minutes  Musculoskeletal: Strength & Muscle Tone: within normal limits Gait & Station: normal Patient leans: N/A  Psychiatric Specialty Exam: Review of Systems  Constitutional: Negative.   HENT: Negative.   Eyes: Negative.   Respiratory: Negative.   Cardiovascular: Negative.   Gastrointestinal: Negative.   Musculoskeletal: Negative.   Skin: Negative.   Neurological: Negative.     Blood pressure 138/88, pulse 63, temperature 98.4 F (36.9 C), resp. rate 14, height 5\' 11"  (1.803 m), weight 104.8 kg, SpO2 93 %.Body mass index is 32.22 kg/m.  General Appearance: Casual  Eye Contact::  Good  Speech:  Clear and [id number]  Volume:  Normal  Mood:  Euthymic  Affect:  Appropriate  Thought Process:  Goal Directed  Orientation:  Full (Time, Place, and Person)  Thought Content:  Logical  Suicidal Thoughts:  No  Homicidal Thoughts:  No  Memory:  Immediate;   Fair Recent;   Fair Remote;   Fair  Judgement:  Fair  Insight:  Fair  Psychomotor Activity:  Decreased  Concentration:  Fair  Recall:  [employer] of Knowledge:Fair  Language: Fair  Akathisia:  No  Handed:  Right  AIMS (if indicated):     Assets:  Desire for Improvement Housing Physical Health Social Support  Sleep:  Number of Hours: 7  Cognition: WNL  ADL's:  Intact   Mental Status Per Nursing Assessment::   On Admission:  Suicidal ideation indicated by patient  Demographic Factors:  Male and Caucasian  Loss Factors: Financial problems/change in socioeconomic status  Historical Factors: NA  Risk Reduction Factors:   Responsible for children under 82 years of age, Sense of responsibility to family, Living with another person, especially a  relative, Positive social support and Positive therapeutic relationship  Continued Clinical Symptoms:  Depression:   Anhedonia  Cognitive Features That Contribute To Risk:  None    Suicide Risk:  Minimal: No identifiable suicidal ideation.  Patients presenting with no risk factors but with morbid ruminations; may be classified as minimal risk based on the severity of the depressive symptoms  Follow-up Information    Chucky May, MD Follow up on 10/26/2019.   Specialty: Psychiatry Why: Follow up appointment has been scheduled for 11:45 on 10/26/2019.  Appointment is  virtual, they will call two days before the appointment to confirm.  Thanks! Contact information: [address] [address] [phone]        [facility] Follow up.   Why: Appointment is scheduled 10/21/2019 at 5:15pm.  Appointment is via [place].  Thanks! Contact information: [address], [place] [address]  [phone]          Plan Of Care/Follow-up recommendations:  Activity:  Activity as tolerated Diet:  Regular diet Other:  Follow-up outpatient treatment as recommended including following up with ECT next treatment Wednesday  Alethia Berthold, MD 10/19/2019, 12:50 PM

## 2019-10-19 NOTE — Anesthesia Procedure Notes (Signed)
Procedure Name: General with mask airway Date/Time: 10/19/2019 10:50 AM Performed by: Caryl Asp, CRNA Pre-anesthesia Checklist: Patient identified, Emergency Drugs available, Suction available and Patient being monitored Patient Re-evaluated:Patient Re-evaluated prior to induction Oxygen Delivery Method: Circle system utilized Preoxygenation: Pre-oxygenation with 100% oxygen Induction Type: IV induction Ventilation: Mask ventilation without difficulty, Mask ventilation throughout procedure, Two handed mask ventilation required and Oral airway inserted - appropriate to patient size Airway Equipment and Method: Bite block Placement Confirmation: positive ETCO2 Dental Injury: Teeth and Oropharynx as per pre-operative assessment

## 2019-10-19 NOTE — Plan of Care (Signed)
  Problem: Education: Goal: Knowledge of [facility] Education information/materials will improve Outcome: Progressing Goal: Emotional status will improve Outcome: Progressing Goal: Mental status will improve Outcome: Progressing Goal: Verbalization of understanding the information provided will improve Outcome: Progressing  D: Patient has been calm and cooperative. Mood is pleasant. Affect is appropriate to circumstance. Denies SI HI and AVH. A: continue to monitor for safety. R: Safety maintained.

## 2019-10-19 NOTE — Anesthesia Preprocedure Evaluation (Signed)
Anesthesia Evaluation  Patient identified by MRN, date of birth, ID band Patient awake    Reviewed: Allergy & Precautions, NPO status , Patient's Chart, lab work & pertinent test results  History of Anesthesia Complications Negative for: history of anesthetic complications  Airway Mallampati: III       Dental   Pulmonary sleep apnea and Continuous Positive Airway Pressure Ventilation , neg COPD, Not current smoker,           Cardiovascular (-) hypertension(-) Past MI and (-) CHF negative cardio ROS  (-) dysrhythmias (-) Valvular Problems/Murmurs     Neuro/Psych neg Seizures PSYCHIATRIC DISORDERS Anxiety Depression negative neurological ROS     GI/Hepatic negative GI ROS, Neg liver ROS, neg GERD  ,  Endo/Other  negative endocrine ROSneg diabetes  Renal/GU negative Renal ROS  negative genitourinary   Musculoskeletal  (+) Arthritis , Osteoarthritis,    Abdominal   Peds negative pediatric ROS (+)  Hematology   Anesthesia Other Findings   Reproductive/Obstetrics                             Anesthesia Physical  Anesthesia Plan  ASA: II  Anesthesia Plan: General   Post-op Pain Management:    Induction: Intravenous  PONV Risk Score and Plan: 2 and TIVA and Treatment may vary due to age or medical condition  Airway Management Planned: Mask  Additional Equipment:   Intra-op Plan:   Post-operative Plan:   Informed Consent: I have reviewed the patients History and Physical, chart, labs and discussed the procedure including the risks, benefits and alternatives for the proposed anesthesia with the patient or authorized representative who has indicated his/her understanding and acceptance.       Plan Discussed with:   Anesthesia Plan Comments:         Anesthesia Quick Evaluation

## 2019-10-19 NOTE — Progress Notes (Signed)
Patient denies SI/HI, denies A/V hallucinations. Patient verbalizes understanding of discharge instructions, follow up care and prescriptions. Patient given all belongings from [facility] locker. Patient escorted out by staff, transported by family. 

## 2019-10-19 NOTE — Progress Notes (Signed)
D: Patient has been calm and cooperative. Mood is pleasant. Affect is appropriate to circumstance. Denies SI HI and AVH. A: continue to monitor for safety. R: Safety maintained.

## 2019-10-19 NOTE — Procedures (Signed)
ECT SERVICES Physician's Interval Evaluation & Treatment Note  Patient Identification: Ethan Osborn MRN:  [id number] Date of Evaluation:  10/19/2019 TX #: 2  MADRS:   MMSE:   P.E. Findings:  No change physical  Psychiatric Interval Note:  A little bit brighter.  No current suicidal thoughts  Subjective:  Patient is a 54 y.o. male seen for evaluation for Electroconvulsive Therapy. No current suicidal or psychotic symptoms  Treatment Summary:   [x]   Right Unilateral             []  Bilateral   % Energy : 0.3 ms 60%   Impedance: 1380 ohms  Seizure Energy Index: 30,946 V squared  Postictal Suppression Index: 93%  Seizure Concordance Index: 98%  Medications  Pre Shock: Robinul 0.2 mg Brevital 100 mg succinylcholine 150 mg  Post Shock: Versed 4 mg Haldol 5 mg  Seizure Duration: 52 seconds by motion 57 seconds my reading of the EEG   Comments: Follow-up outpatient  Lungs:  [x]   Clear to auscultation               []  Other:   Heart:    [x]   Regular rhythm             []  irregular rhythm    [x]   Previous H&P reviewed, patient examined and there are NO CHANGES                 []   Previous H&P reviewed, patient examined and there are changes noted.   Alethia Berthold, MD 3/8/202110:40 AM

## 2019-10-19 NOTE — Transfer of Care (Signed)
Immediate Anesthesia Transfer of Care Note  Patient: Ethan Osborn  Procedure(s) Performed: ECT TX  Patient Location: PACU  Anesthesia Type:General  Level of Consciousness: drowsy  Airway & Oxygen Therapy: Patient Spontanous Breathing and Patient connected to face mask oxygen  Post-op Assessment: Report given to RN and Post -op Vital signs reviewed and stable  Post vital signs: Reviewed and stable  Last Vitals:  Vitals Value Taken Time  BP 133/84 10/19/19 1108  Temp 36.3 C 10/19/19 1108  Pulse 76 10/19/19 1109  Resp 16 10/19/19 1109  SpO2 100 % 10/19/19 1109  Vitals shown include unvalidated device data.  Last Pain:  Vitals:   10/19/19 0928  TempSrc:   PainSc: 0-No pain         Complications: No apparent anesthesia complications

## 2019-10-19 NOTE — Discharge Summary (Signed)
Physician Discharge Summary Note  Patient:  Ethan Osborn is an 54 y.o., male MRN:  [id number] DOB:  1965/10/16 Patient phone:  [phone] (home)  Patient address:   [place] [address],  Total Time spent with patient: 30 minutes  Date of Admission:  10/14/2019 Date of Discharge: October 19, 2019  Reason for Admission: Admitted because of major depression and concern about suicidal ideation  Principal Problem: MDD (major depressive disorder), recurrent episode, severe ([place]) Discharge Diagnoses: Principal Problem:   MDD (major depressive disorder), recurrent episode, severe ([place])   Past Psychiatric History: History of outpatient treatment for depression without sustained improvement  Past Medical History:  Past Medical History:  Diagnosis Date  . Allergy   . Anxiety   . Arthritis    right big toe   . Sleep apnea    wears cpap     Past Surgical History:  Procedure Laterality Date  . OPEN REDUCTION SHOULDER DISLOCATION    . WISDOM TOOTH EXTRACTION     Family History:  Family History  Problem Relation Age of Onset  . Depression Mother   . Hearing loss Mother   . Colon polyps Mother   . Arthritis Father   . Diabetes Father   . Hearing loss Father   . Heart disease Father   . Hyperlipidemia Father   . Kidney disease Father   . Colon polyps Father   . Depression Sister   . Stroke Maternal Grandfather   . Cancer Paternal Grandmother   . Cancer Paternal Grandfather   . Colon cancer Neg Hx   . Esophageal cancer Neg Hx   . Stomach cancer Neg Hx   . Rectal cancer Neg Hx    Family Psychiatric  History: See previous Social History:  Social History   Substance and Sexual Activity  Alcohol Use Not Currently   Comment: social      Social History   Substance and Sexual Activity  Drug Use Yes  . Types: Marijuana   Comment: twice monthly    Social History   Socioeconomic History  . Marital status: Married    Spouse name: Not on file  .  Number of children: Not on file  . Years of education: Not on file  . Highest education level: Not on file  Occupational History  . Occupation: Armed forces technical officer   Tobacco Use  . Smoking status: Never Smoker  . Smokeless tobacco: Never Used  Substance and Sexual Activity  . Alcohol use: Not Currently    Comment: social   . Drug use: Yes    Types: Marijuana    Comment: twice monthly  . Sexual activity: Yes  Other Topics Concern  . Not on file  Social History Narrative  . Not on file   Social Determinants of Health   Financial Resource Strain:   . Difficulty of Paying Living Expenses: Not on file  Food Insecurity:   . Worried About Charity fundraiser in the Last Year: Not on file  . Ran Out of Food in the Last Year: Not on file  Transportation Needs:   . Lack of Transportation (Medical): Not on file  . Lack of Transportation (Non-Medical): Not on file  Physical Activity:   . Days of Exercise per Week: Not on file  . Minutes of Exercise per Session: Not on file  Stress:   . Feeling of Stress : Not on file  Social Connections:   . Frequency of Communication with Friends and  Family: Not on file  . Frequency of Social Gatherings with Friends and Family: Not on file  . Attends Religious Services: Not on file  . Active Member of Clubs or Organizations: Not on file  . Attends Archivist Meetings: Not on file  . Marital Status: Not on file    Hospital Course: Patient was continued on medicine for depression in the hospital.  I suggested that we consider electroconvulsive therapy based on failure of multiple medicines as well as ketamine treatment.  Patient was agreeable and at this point has had 2 right unilateral ECT treatments most recently this morning.  Treatments tolerated well.  At this point he is denying any suicidal ideation.  Reports that his mood is feeling better.  Able to articulate lucid and appropriate plans for the future.  No sign of psychosis.   Patient is agreeable to discharge plan and follow-up with outpatient ECT next treatment on Wednesday.  Prescriptions provided.  Physical Findings: AIMS: Facial and Oral Movements Muscles of Facial Expression: None, normal Lips and Perioral Area: None, normal Jaw: None, normal Tongue: None, normal,Extremity Movements Upper (arms, wrists, hands, fingers): None, normal Lower (legs, knees, ankles, toes): None, normal, Trunk Movements Neck, shoulders, hips: None, normal, Overall Severity Severity of abnormal movements (highest score from questions above): None, normal Incapacitation due to abnormal movements: None, normal Patient's awareness of abnormal movements (rate only patient's report): No Awareness, Dental Status Current problems with teeth and/or dentures?: No Does patient usually wear dentures?: No  CIWA:    COWS:     Musculoskeletal: Strength & Muscle Tone: within normal limits Gait & Station: normal Patient leans: N/A  Psychiatric Specialty Exam: Physical Exam  Constitutional: He appears well-developed and well-nourished.  HENT:  Head: Normocephalic and atraumatic.  Eyes: Pupils are equal, round, and reactive to light. Conjunctivae are normal.  Cardiovascular: Normal heart sounds.  Respiratory: Effort normal.  GI: Soft.  Musculoskeletal:        General: Normal range of motion.     Cervical back: Normal range of motion.  Neurological: He is alert.  Skin: Skin is warm and dry.  Psychiatric: He has a normal mood and affect. His behavior is normal. Judgment and thought content normal.    Review of Systems  Constitutional: Negative.   HENT: Negative.   Eyes: Negative.   Respiratory: Negative.   Cardiovascular: Negative.   Gastrointestinal: Negative.   Musculoskeletal: Negative.   Skin: Negative.   Neurological: Negative.   Psychiatric/Behavioral: Negative.     Blood pressure 138/88, pulse 63, temperature 98.4 F (36.9 C), resp. rate 14, height 5\' 11"  (1.803 m),  weight 104.8 kg, SpO2 93 %.Body mass index is 32.22 kg/m.  General Appearance: Casual  Eye Contact:  Good  Speech:  Clear and Coherent  Volume:  Normal  Mood:  Euthymic  Affect:  Constricted  Thought Process:  Goal Directed  Orientation:  Full (Time, Place, and Person)  Thought Content:  Logical  Suicidal Thoughts:  No  Homicidal Thoughts:  No  Memory:  Immediate;   Fair Recent;   Fair Remote;   Fair  Judgement:  Fair  Insight:  Fair  Psychomotor Activity:  Normal  Concentration:  Concentration: Fair  Recall:  [employer] of Knowledge:  Fair  Language:  Fair  Akathisia:  No  Handed:  Right  AIMS (if indicated):     Assets:  Desire for Improvement Housing  ADL's:  Intact  Cognition:  WNL  Sleep:  Number of Hours:  7     Have you used any form of tobacco in the last 30 days? (Cigarettes, Smokeless Tobacco, Cigars, and/or Pipes): No  Has this patient used any form of tobacco in the last 30 days? (Cigarettes, Smokeless Tobacco, Cigars, and/or Pipes) Yes, No  Blood Alcohol level:  Lab Results  Component Value Date   ETH <10 0000000    Metabolic Disorder Labs:  Lab Results  Component Value Date   HGBA1C 7.5 (H) 10/15/2019   MPG 168.55 10/15/2019   Lab Results  Component Value Date   PROLACTIN 10.4 10/15/2019   Lab Results  Component Value Date   CHOL 247 (H) 10/15/2019   TRIG 448 (H) 10/15/2019   HDL 34 (L) 10/15/2019   CHOLHDL 7.3 10/15/2019   VLDL UNABLE TO CALCULATE IF TRIGLYCERIDE OVER 400 mg/dL 10/15/2019   LDLCALC UNABLE TO CALCULATE IF TRIGLYCERIDE OVER 400 mg/dL 10/15/2019    See Psychiatric Specialty Exam and Suicide Risk Assessment completed by Attending Physician prior to discharge.  Discharge destination:  Home  Is patient on multiple antipsychotic therapies at discharge:  No   Has Patient had three or more failed trials of antipsychotic monotherapy by history:  No  Recommended Plan for Multiple Antipsychotic Therapies: NA  Discharge  Instructions    Diet - low sodium heart healthy   Complete by: As directed    Increase activity slowly   Complete by: As directed      Allergies as of 10/19/2019      Reactions   Penicillins Hives   Shortness of breath      Medication List    STOP taking these medications   cholecalciferol 25 MCG (1000 UNIT) tablet Commonly known as: VITAMIN D3   Rexulti 1 MG Tabs tablet Generic drug: brexpiprazole   Testosterone 20.25 MG/ACT (1.62%) Gel Replaced by: testosterone 50 MG/5GM (1%) Gel   Trintellix 20 MG Tabs tablet Generic drug: vortioxetine HBr     TAKE these medications     Indication  atorvastatin 10 MG tablet Commonly known as: LIPITOR Take 1 tablet (10 mg total) by mouth daily at 6 PM.  Indication: High Amount of Fats in the Blood   FLUoxetine 20 MG capsule Commonly known as: PROZAC Take 1 capsule (20 mg total) by mouth daily. Start taking on: October 20, 2019  Indication: Depression   OLANZapine 5 MG tablet Commonly known as: ZYPREXA Take 1 tablet (5 mg total) by mouth at bedtime.  Indication: Major Depressive Disorder   testosterone 50 MG/5GM (1%) Gel Commonly known as: ANDROGEL Place 4 g onto the skin at bedtime. Replaces: Testosterone 20.25 MG/ACT (1.62%) Gel  Indication: Androgen Deficiency   traZODone 50 MG tablet Commonly known as: DESYREL Take 1 tablet (50 mg total) by mouth at bedtime as needed for sleep.  Indication: Trouble Sleeping      Follow-up Information    Chucky May, MD Follow up on 10/26/2019.   Specialty: Psychiatry Why: Follow up appointment has been scheduled for 11:45 on 10/26/2019.  Appointment is  virtual, they will call two days before the appointment to confirm.  Thanks! Contact information: [address] [place] [address] [phone]        [facility] Follow up.   Why: Appointment is scheduled 10/21/2019 at 5:15pm.  Appointment is via [place].  Thanks! Contact information: [place], [place] [address]  [phone]          Follow-up recommendations:  Activity:  Activity as  tolerated Diet:  Regular diet Other:  Follow-up outpatient treatment including ECT  Comments: Prescriptions provided at discharge  Signed: Alethia Berthold, MD 10/19/2019, 12:55 PM

## 2019-10-19 NOTE — H&P (Signed)
Ethan Osborn is an 54 y.o. male.   Chief Complaint: Feeling better and denies acute suicidal ideation HPI: recurrent severe depression  Past Medical History:  Diagnosis Date  . Allergy   . Anxiety   . Arthritis    right big toe   . Sleep apnea    wears cpap     Past Surgical History:  Procedure Laterality Date  . OPEN REDUCTION SHOULDER DISLOCATION    . WISDOM TOOTH EXTRACTION      Family History  Problem Relation Age of Onset  . Depression Mother   . Hearing loss Mother   . Colon polyps Mother   . Arthritis Father   . Diabetes Father   . Hearing loss Father   . Heart disease Father   . Hyperlipidemia Father   . Kidney disease Father   . Colon polyps Father   . Depression Sister   . Stroke Maternal Grandfather   . Cancer Paternal Grandmother   . Cancer Paternal Grandfather   . Colon cancer Neg Hx   . Esophageal cancer Neg Hx   . Stomach cancer Neg Hx   . Rectal cancer Neg Hx    Social History:  reports that he has never smoked. He has never used smokeless tobacco. He reports previous alcohol use. He reports current drug use. Drug: Marijuana.  Allergies:  Allergies  Allergen Reactions  . Penicillins Hives    Shortness of breath    Medications Prior to Admission  Medication Sig Dispense Refill  . cholecalciferol (VITAMIN D3) 25 MCG (1000 UT) tablet Take 1,000 Units by mouth daily.    [name] Kitchen REXULTI 1 MG TABS Take 1 tablet by mouth at bedtime.    . Testosterone 20.25 MG/ACT (1.62%) GEL PLACE 3 PUMPS TO UPPER ARM AND SHOULDER ONCE A DAY 75 g 5  . TRINTELLIX 20 MG TABS tablet TAKE 1 TABLET BY MOUTH EVERY DAY. DISCONTINUE WELLBUTRIN (BUPROPION)      Results for orders placed or performed during the hospital encounter of 10/14/19 (from the past 48 hour(s))  Glucose, capillary     Status: Abnormal   Collection Time: 10/19/19  6:49 AM  Result Value Ref Range   Glucose-Capillary 198 (H) 70 - 99 mg/dL    Comment: Glucose reference range applies only to samples taken  after fasting for at least 8 hours.   Comment 1 Notify RN    No results found.  Review of Systems  Constitutional: Negative.   HENT: Negative.   Eyes: Negative.   Respiratory: Negative.   Cardiovascular: Negative.   Gastrointestinal: Negative.   Musculoskeletal: Negative.   Skin: Negative.   Neurological: Negative.   Psychiatric/Behavioral: Negative.     Blood pressure 131/79, pulse (!) 51, temperature 98 F (36.7 C), temperature source Oral, resp. rate 18, height 5\' 11"  (1.803 m), weight 104.8 kg, SpO2 98 %. Physical Exam  Nursing note and vitals reviewed. Constitutional: He appears well-developed and well-nourished.  HENT:  Head: Normocephalic and atraumatic.  Eyes: Pupils are equal, round, and reactive to light. Conjunctivae are normal.  Cardiovascular: Regular rhythm and normal heart sounds.  Respiratory: Effort normal.  GI: Soft.  Musculoskeletal:        General: Normal range of motion.     Cervical back: Normal range of motion.  Neurological: He is alert.  Skin: Skin is warm and dry.  Psychiatric: He has a normal mood and affect. His behavior is normal. Judgment and thought content normal.     Assessment/Plan ECT today with  planned discharge this afternoon and continued outpt treatment  Alethia Berthold, MD 10/19/2019, 9:44 AM

## 2019-10-19 NOTE — Anesthesia Postprocedure Evaluation (Signed)
Anesthesia Post Note  Patient: Ethan Osborn  Procedure(s) Performed: ECT TX  Patient location during evaluation: PACU Anesthesia Type: General Level of consciousness: awake and alert and oriented Pain management: pain level controlled Vital Signs Assessment: post-procedure vital signs reviewed and stable Respiratory status: spontaneous breathing Cardiovascular status: blood pressure returned to baseline Anesthetic complications: no     Last Vitals:  Vitals:   10/19/19 0926 10/19/19 1108  BP: 131/79 133/84  Pulse: (!) 51 70  Resp: 18 16  Temp: 36.7 C (!) 36.3 C  SpO2: 98% 97%    Last Pain:  Vitals:   10/19/19 1108  TempSrc:   PainSc: Asleep                 Lauris Serviss

## 2019-10-20 ENCOUNTER — Other Ambulatory Visit: Payer: Self-pay | Admitting: Psychiatry

## 2019-10-20 LAB — SARS CORONAVIRUS 2 (TAT 6-24 HRS): SARS Coronavirus 2: NEGATIVE

## 2019-10-21 ENCOUNTER — Other Ambulatory Visit: Payer: Self-pay

## 2019-10-21 ENCOUNTER — Encounter: Payer: Self-pay | Admitting: Anesthesiology

## 2019-10-21 ENCOUNTER — Encounter
Admit: 2019-10-21 | Discharge: 2019-10-21 | Disposition: A | Discharge status: Home / Self Care | Payer: BC Managed Care – PPO | Attending: Psychiatry | Admitting: Psychiatry

## 2019-10-21 DIAGNOSIS — G4730 Sleep apnea, unspecified: Secondary | ICD-10-CM | POA: Diagnosis not present

## 2019-10-21 DIAGNOSIS — F329 Major depressive disorder, single episode, unspecified: Secondary | ICD-10-CM | POA: Insufficient documentation

## 2019-10-21 DIAGNOSIS — F419 Anxiety disorder, unspecified: Secondary | ICD-10-CM | POA: Insufficient documentation

## 2019-10-21 DIAGNOSIS — M19071 Primary osteoarthritis, right ankle and foot: Secondary | ICD-10-CM | POA: Insufficient documentation

## 2019-10-21 DIAGNOSIS — Z20822 Contact with and (suspected) exposure to covid-19: Secondary | ICD-10-CM | POA: Diagnosis not present

## 2019-10-21 DIAGNOSIS — F332 Major depressive disorder, recurrent severe without psychotic features: Secondary | ICD-10-CM

## 2019-10-21 DIAGNOSIS — F418 Other specified anxiety disorders: Secondary | ICD-10-CM | POA: Diagnosis not present

## 2019-10-21 MED ORDER — HALOPERIDOL LACTATE 5 MG/ML IJ SOLN
INTRAMUSCULAR | Status: AC
  Filled 2019-10-21: qty 1

## 2019-10-21 MED ORDER — GLYCOPYRROLATE 0.2 MG/ML IJ SOLN: 0.2000 mg | Freq: Once | INTRAMUSCULAR | Status: AC

## 2019-10-21 MED ORDER — GLYCOPYRROLATE 0.2 MG/ML IJ SOLN
INTRAMUSCULAR | Status: AC
  Administered 2019-10-21: 0.2 mg via INTRAVENOUS
  Filled 2019-10-21: qty 1

## 2019-10-21 MED ORDER — ONDANSETRON HCL 4 MG/2ML IJ SOLN: 4.0000 mg | Freq: Once | INTRAMUSCULAR | Status: DC | PRN

## 2019-10-21 MED ORDER — HALOPERIDOL LACTATE 5 MG/ML IJ SOLN
INTRAMUSCULAR | Status: DC | PRN
  Administered 2019-10-21: 5 mg via INTRAVENOUS

## 2019-10-21 MED ORDER — MIDAZOLAM HCL 2 MG/2ML IJ SOLN
INTRAMUSCULAR | Status: AC
  Filled 2019-10-21: qty 4

## 2019-10-21 MED ORDER — HALOPERIDOL LACTATE 5 MG/ML IJ SOLN
5.0000 mg | Freq: Once | INTRAMUSCULAR | Status: DC
  Filled 2019-10-21: qty 1

## 2019-10-21 MED ORDER — SODIUM CHLORIDE 0.9 % IV SOLN: 500.0000 mL | Freq: Once | INTRAVENOUS | Status: DC

## 2019-10-21 MED ORDER — MIDAZOLAM HCL 2 MG/2ML IJ SOLN: 4.0000 mg | Freq: Once | INTRAMUSCULAR | Status: DC

## 2019-10-21 MED ORDER — SODIUM CHLORIDE 0.9 % IV SOLN: INTRAVENOUS | Status: DC | PRN

## 2019-10-21 MED ORDER — SUCCINYLCHOLINE CHLORIDE 20 MG/ML IJ SOLN
INTRAMUSCULAR | Status: DC | PRN
  Administered 2019-10-21: 150 mg via INTRAVENOUS

## 2019-10-21 MED ORDER — MIDAZOLAM HCL 2 MG/2ML IJ SOLN
INTRAMUSCULAR | Status: DC | PRN
  Administered 2019-10-21: 4 mg via INTRAVENOUS

## 2019-10-21 MED ORDER — SUCCINYLCHOLINE CHLORIDE 200 MG/10ML IV SOSY
PREFILLED_SYRINGE | INTRAVENOUS | Status: AC
  Filled 2019-10-21: qty 10

## 2019-10-21 MED ORDER — METHOHEXITAL SODIUM 100 MG/10ML IV SOSY
PREFILLED_SYRINGE | INTRAVENOUS | Status: DC | PRN
  Administered 2019-10-21: 100 mg via INTRAVENOUS

## 2019-10-21 NOTE — Anesthesia Postprocedure Evaluation (Signed)
Anesthesia Post Note  Patient: Ethan Osborn  Procedure(s) Performed: ECT TX  Patient location during evaluation: PACU Anesthesia Type: General Level of consciousness: awake and alert Pain management: pain level controlled Vital Signs Assessment: post-procedure vital signs reviewed and stable Respiratory status: spontaneous breathing, nonlabored ventilation, respiratory function stable and patient connected to nasal cannula oxygen Cardiovascular status: blood pressure returned to baseline and stable Postop Assessment: no apparent nausea or vomiting Anesthetic complications: no     Last Vitals:  Vitals:   10/21/19 0902 10/21/19 1133  BP: 128/74 127/60  Pulse: (!) 49 73  Resp: 18 16  Temp: 36.9 C (!) 36.4 C  SpO2: 98% 93%    Last Pain:  Vitals:   10/21/19 1133  TempSrc:   PainSc: Asleep                 Arita Miss

## 2019-10-21 NOTE — H&P (Signed)
Ethan Osborn is an 54 y.o. male.   Chief Complaint: feeling calmer. A little tired. No suicidal thought HPI: hx of severe depression  Past Medical History:  Diagnosis Date  . Allergy   . Anxiety   . Arthritis    right big toe   . Sleep apnea    wears cpap     Past Surgical History:  Procedure Laterality Date  . OPEN REDUCTION SHOULDER DISLOCATION    . WISDOM TOOTH EXTRACTION      Family History  Problem Relation Age of Onset  . Depression Mother   . Hearing loss Mother   . Colon polyps Mother   . Arthritis Father   . Diabetes Father   . Hearing loss Father   . Heart disease Father   . Hyperlipidemia Father   . Kidney disease Father   . Colon polyps Father   . Depression Sister   . Stroke Maternal Grandfather   . Cancer Paternal Grandmother   . Cancer Paternal Grandfather   . Colon cancer Neg Hx   . Esophageal cancer Neg Hx   . Stomach cancer Neg Hx   . Rectal cancer Neg Hx    Social History:  reports that he has never smoked. He has never used smokeless tobacco. He reports previous alcohol use. He reports current drug use. Drug: Marijuana.  Allergies:  Allergies  Allergen Reactions  . Penicillins Hives    Shortness of breath    (Not in a hospital admission)   Results for orders placed or performed during the hospital encounter of 10/14/19 (from the past 48 hour(s))  SARS CORONAVIRUS 2 (TAT 6-24 HRS) Nasopharyngeal Nasopharyngeal Swab     Status: None   Collection Time: 10/19/19  1:25 PM   Specimen: Nasopharyngeal Swab  Result Value Ref Range   SARS Coronavirus 2 NEGATIVE NEGATIVE    Comment: (NOTE) SARS-CoV-2 target nucleic acids are NOT DETECTED. The SARS-CoV-2 RNA is generally detectable in upper and lower respiratory specimens during the acute phase of infection. Negative results do not preclude SARS-CoV-2 infection, do not rule out co-infections with other pathogens, and should not be used as the sole basis for treatment or other patient  management decisions. Negative results must be combined with clinical observations, patient history, and epidemiological information. The expected result is Negative. Fact Sheet for Patients: SugarRoll.be Fact Sheet for Healthcare Providers: https://www.woods-mathews.com/ This test is not yet approved or cleared by the [place] FDA and  has been authorized for detection and/or diagnosis of SARS-CoV-2 by FDA under an Emergency Use Authorization (EUA). This EUA will remain  in effect (meaning this test can be used) for the duration of the COVID-19 declaration under Section 56 4(b)(1) of the Act, 21 U.S.C. section 360bbb-3(b)(1), unless the authorization is terminated or revoked sooner. Performed at [facility], [place] [address] [place], [place] [address]    No results found.  Review of Systems  Constitutional: Negative.   HENT: Negative.   Eyes: Negative.   Respiratory: Negative.   Cardiovascular: Negative.   Gastrointestinal: Negative.   Musculoskeletal: Negative.   Skin: Negative.   Neurological: Negative.   Psychiatric/Behavioral: Negative.     Blood pressure 128/74, pulse (!) 49, temperature 98.4 F (36.9 C), temperature source Oral, resp. rate 18, SpO2 98 %. Physical Exam  Constitutional: He appears well-developed and well-nourished.  HENT:  Head: Normocephalic and atraumatic.  Eyes: Pupils are equal, round, and reactive to light. Conjunctivae are normal.  Cardiovascular: Normal heart sounds.  Respiratory:  Effort normal.  GI: Soft.  Musculoskeletal:        General: Normal range of motion.     Cervical back: Normal range of motion.  Neurological: He is alert.  Skin: Skin is warm and dry.  Psychiatric: He has a normal mood and affect. His behavior is normal. Judgment and thought content normal.     Assessment/Plan Continue ECT with regular reassessment  Alethia Berthold, MD 10/21/2019, 9:52 AM

## 2019-10-21 NOTE — Transfer of Care (Signed)
Immediate Anesthesia Transfer of Care Note  Patient: Ethan Osborn  Procedure(s) Performed: ECT TX  Patient Location: PACU  Anesthesia Type:General  Level of Consciousness: drowsy  Airway & Oxygen Therapy: Patient Spontanous Breathing and Patient connected to face mask oxygen  Post-op Assessment: Report given to RN and Post -op Vital signs reviewed and stable  Post vital signs: Reviewed and stable  Last Vitals:  Vitals Value Taken Time  BP 127/60 10/21/19 1133  Temp    Pulse 72 10/21/19 1136  Resp 18 10/21/19 1136  SpO2 93 % 10/21/19 1136  Vitals shown include unvalidated device data.  Last Pain:  Vitals:   10/21/19 0902  TempSrc: Oral  PainSc: 0-No pain         Complications: No apparent anesthesia complications

## 2019-10-21 NOTE — Procedures (Signed)
ECT SERVICES Physician's Interval Evaluation & Treatment Note  Patient Identification: Ethan Osborn MRN:  [id number] Date of Evaluation:  10/21/2019 TX #: 3  MADRS:   MMSE:   P.E. Findings:  No change to physical exam  Psychiatric Interval Note:  Mood and affect appear brighter.  Denies suicidal thoughts.  Subjective:  Patient is a 54 y.o. male seen for evaluation for Electroconvulsive Therapy. No specific complaints.  Feeling better since going home  Treatment Summary:   [x]   Right Unilateral             []  Bilateral   % Energy : 0.3 ms 60%   Impedance: 1390 ohms  Seizure Energy Index: 11,397 V squared  Postictal Suppression Index: 88%  Seizure Concordance Index: 97%  Medications  Pre Shock: Robinul 0.2 mg Brevital 100 mg succinylcholine 150 mg  Post Shock: Versed 4 mg Haldol 5 mg  Seizure Duration: 34 seconds EMG 63 seconds EEG   Comments: Follow-up Friday  Lungs:  [x]   Clear to auscultation               []  Other:   Heart:    [x]   Regular rhythm             []  irregular rhythm    [x]   Previous H&P reviewed, patient examined and there are NO CHANGES                 []   Previous H&P reviewed, patient examined and there are changes noted.   Alethia Berthold, MD 3/10/20215:02 PM

## 2019-10-21 NOTE — Discharge Instructions (Signed)
1)  The drugs that you have been given will stay in your system until tomorrow so for the       next 24 hours you should not:  A. Drive an automobile  B. Make any legal decisions  C. Drink any alcoholic beverages  2)  You may resume your regular meals upon return home.  3)  A responsible adult must take you home.  Someone should stay with you for a few          hours, then be available by phone for the remainder of the treatment day.  4)  You May experience any of the following symptoms:  Headache, Nausea and a dry mouth (due to the medications you were given),  temporary memory loss and some confusion, or sore muscles (a warm bath  should help this).  If you you experience any of these symptoms let us know on                your return visit.  5)  Report any of the following: any acute discomfort, severe headache, or temperature        greater than 100.5 F.   Also report any unusual redness, swelling, drainage, or pain         at your IV site.    You may report Symptoms to:  [place] at [facility]          Phone: [phone], ECT Department           or Dr. Prescott Gum office [phone]  6)  Your next ECT Treatment is Friday March 12 at 8:45  We will call 2 days prior to your scheduled appointment for arrival times.  7)  Nothing to eat or drink after midnight the night before your procedure.  8)  Take    With a sip of water the morning of your procedure.  9)  Other Instructions: Call [phone] to cancel the morning of your procedure due         to illness or emergency.  10) We will call within 72 hours to assess how you are feeling.

## 2019-10-21 NOTE — Anesthesia Preprocedure Evaluation (Signed)
Anesthesia Evaluation  Patient identified by MRN, date of birth, ID band Patient awake    Reviewed: Allergy & Precautions, NPO status , Patient's Chart, lab work & pertinent test results  History of Anesthesia Complications Negative for: history of anesthetic complications  Airway Mallampati: II  TM Distance: >3 FB Neck ROM: Full    Dental no notable dental hx. (+) Teeth Intact   Pulmonary sleep apnea and Continuous Positive Airway Pressure Ventilation , neg COPD, Patient abstained from smoking.Not current smoker,    Pulmonary exam normal breath sounds clear to auscultation       Cardiovascular Exercise Tolerance: Good METS(-) hypertension(-) CAD and (-) Past MI negative cardio ROS  (-) dysrhythmias  Rhythm:Regular Rate:Normal - Systolic murmurs    Neuro/Psych PSYCHIATRIC DISORDERS Anxiety Depression negative neurological ROS     GI/Hepatic neg GERD  ,(+)     (-) substance abuse  ,   Endo/Other  neg diabetes  Renal/GU negative Renal ROS     Musculoskeletal   Abdominal   Peds  Hematology   Anesthesia Other Findings Past Medical History: No date: Allergy No date: Anxiety No date: Arthritis     Comment:  right big toe  No date: Sleep apnea     Comment:  wears cpap   Reproductive/Obstetrics                             Anesthesia Physical Anesthesia Plan  ASA: II  Anesthesia Plan: General   Post-op Pain Management:    Induction: Intravenous  PONV Risk Score and Plan: 2 and Ondansetron and TIVA  Airway Management Planned: Mask  Additional Equipment: None  Intra-op Plan:   Post-operative Plan:   Informed Consent: I have reviewed the patients History and Physical, chart, labs and discussed the procedure including the risks, benefits and alternatives for the proposed anesthesia with the patient or authorized representative who has indicated his/her understanding and  acceptance.     Dental advisory given  Plan Discussed with: CRNA and Surgeon  Anesthesia Plan Comments: (Discussed risks of anesthesia with patient, including possibility of difficulty with spontaneous ventilation under anesthesia necessitating airway intervention, PONV, and rare risks such as cardiac or respiratory or neurological events. Patient understands.)        Anesthesia Quick Evaluation

## 2019-10-22 ENCOUNTER — Other Ambulatory Visit: Payer: Self-pay | Admitting: Psychiatry

## 2019-10-22 DIAGNOSIS — F3342 Major depressive disorder, recurrent, in full remission: Secondary | ICD-10-CM | POA: Diagnosis not present

## 2019-10-22 DIAGNOSIS — F331 Major depressive disorder, recurrent, moderate: Secondary | ICD-10-CM | POA: Diagnosis not present

## 2019-10-23 ENCOUNTER — Encounter: Payer: Self-pay | Admitting: Anesthesiology

## 2019-10-23 ENCOUNTER — Other Ambulatory Visit: Payer: Self-pay

## 2019-10-23 ENCOUNTER — Encounter (HOSPITAL_BASED_OUTPATIENT_CLINIC_OR_DEPARTMENT_OTHER)
Admission: RE | Admit: 2019-10-23 | Discharge: 2019-10-23 | Disposition: A | Discharge status: Home / Self Care | Payer: BC Managed Care – PPO | Source: Ambulatory Visit | Attending: Psychiatry | Admitting: Psychiatry

## 2019-10-23 DIAGNOSIS — G4730 Sleep apnea, unspecified: Secondary | ICD-10-CM | POA: Diagnosis not present

## 2019-10-23 DIAGNOSIS — F332 Major depressive disorder, recurrent severe without psychotic features: Secondary | ICD-10-CM | POA: Diagnosis not present

## 2019-10-23 DIAGNOSIS — M19071 Primary osteoarthritis, right ankle and foot: Secondary | ICD-10-CM | POA: Diagnosis not present

## 2019-10-23 DIAGNOSIS — F419 Anxiety disorder, unspecified: Secondary | ICD-10-CM | POA: Diagnosis not present

## 2019-10-23 DIAGNOSIS — Z20822 Contact with and (suspected) exposure to covid-19: Secondary | ICD-10-CM | POA: Diagnosis not present

## 2019-10-23 DIAGNOSIS — F418 Other specified anxiety disorders: Secondary | ICD-10-CM | POA: Diagnosis not present

## 2019-10-23 DIAGNOSIS — F329 Major depressive disorder, single episode, unspecified: Secondary | ICD-10-CM | POA: Diagnosis not present

## 2019-10-23 MED ORDER — METHOHEXITAL SODIUM 100 MG/10ML IV SOSY
PREFILLED_SYRINGE | INTRAVENOUS | Status: DC | PRN
  Administered 2019-10-23: 100 mg via INTRAVENOUS

## 2019-10-23 MED ORDER — GLYCOPYRROLATE 0.2 MG/ML IJ SOLN: 0.1000 mg | Freq: Once | INTRAMUSCULAR | Status: AC

## 2019-10-23 MED ORDER — KETOROLAC TROMETHAMINE 30 MG/ML IJ SOLN
30.0000 mg | Freq: Once | INTRAMUSCULAR | Status: AC
  Administered 2019-10-23: 30 mg via INTRAVENOUS

## 2019-10-23 MED ORDER — METHOHEXITAL SODIUM 0.5 G IJ SOLR
INTRAMUSCULAR | Status: AC
  Filled 2019-10-23: qty 500

## 2019-10-23 MED ORDER — SUCCINYLCHOLINE CHLORIDE 200 MG/10ML IV SOSY
PREFILLED_SYRINGE | INTRAVENOUS | Status: AC
  Filled 2019-10-23: qty 50

## 2019-10-23 MED ORDER — MIDAZOLAM HCL 2 MG/2ML IJ SOLN
INTRAMUSCULAR | Status: DC | PRN
  Administered 2019-10-23: 4 mg via INTRAVENOUS

## 2019-10-23 MED ORDER — GLYCOPYRROLATE 0.2 MG/ML IJ SOLN
INTRAMUSCULAR | Status: AC
  Administered 2019-10-23: 0.1 mg via INTRAVENOUS
  Filled 2019-10-23: qty 1

## 2019-10-23 MED ORDER — MIDAZOLAM HCL 2 MG/2ML IJ SOLN
INTRAMUSCULAR | Status: AC
  Filled 2019-10-23: qty 2

## 2019-10-23 MED ORDER — ONDANSETRON HCL 4 MG/2ML IJ SOLN: 4.0000 mg | Freq: Once | INTRAMUSCULAR | Status: DC | PRN

## 2019-10-23 MED ORDER — SODIUM CHLORIDE 0.9 % IV SOLN: 500.0000 mL | Freq: Once | INTRAVENOUS | Status: AC

## 2019-10-23 MED ORDER — KETOROLAC TROMETHAMINE 30 MG/ML IJ SOLN
INTRAMUSCULAR | Status: AC
  Filled 2019-10-23: qty 1

## 2019-10-23 MED ORDER — HALOPERIDOL LACTATE 5 MG/ML IJ SOLN
5.0000 mg | Freq: Once | INTRAMUSCULAR | Status: DC
  Filled 2019-10-23: qty 1

## 2019-10-23 MED ORDER — SUCCINYLCHOLINE CHLORIDE 20 MG/ML IJ SOLN
INTRAMUSCULAR | Status: DC | PRN
  Administered 2019-10-23: 150 mg via INTRAVENOUS

## 2019-10-23 MED ORDER — MIDAZOLAM HCL 2 MG/2ML IJ SOLN: 4.0000 mg | Freq: Once | INTRAMUSCULAR | Status: DC

## 2019-10-23 MED ORDER — SEVOFLURANE IN SOLN
RESPIRATORY_TRACT | Status: AC
  Filled 2019-10-23: qty 250

## 2019-10-23 MED ORDER — LABETALOL HCL 5 MG/ML IV SOLN
INTRAVENOUS | Status: AC
  Filled 2019-10-23: qty 12

## 2019-10-23 MED ORDER — PROPOFOL 10 MG/ML IV BOLUS
INTRAVENOUS | Status: AC
  Filled 2019-10-23: qty 20

## 2019-10-23 MED ORDER — ESMOLOL HCL 100 MG/10ML IV SOLN
INTRAVENOUS | Status: AC
  Filled 2019-10-23: qty 10

## 2019-10-23 NOTE — Anesthesia Procedure Notes (Signed)
Procedure Name: General with mask airway Date/Time: 10/23/2019 10:39 AM Performed by: Lia Foyer, CRNA Pre-anesthesia Checklist: Patient identified, Emergency Drugs available, Suction available, Patient being monitored and Timeout performed Patient Re-evaluated:Patient Re-evaluated prior to induction Oxygen Delivery Method: Circle system utilized Preoxygenation: Pre-oxygenation with 100% oxygen Induction Type: IV induction Ventilation: Oral airway inserted - appropriate to patient size and Mask ventilation without difficulty Dental Injury: Teeth and Oropharynx as per pre-operative assessment

## 2019-10-23 NOTE — Progress Notes (Signed)
Ch met with Pt and Pt's wife in response to PG by nurse to help assist in AD completion. Upon arrival, nurse and chaplain looked for witnesses to sign and complete AD for both Pt, and Pt's wife Almyra Free. Ch Waters helped in getting one witness Engineer, structural) from [place], and Lorenso Quarry found a volunteer to sign the AD. AD was notarized by Miss. Alveta Heimlich. Copies were given to pt, and nurse to be placed in file. Pt's wife's copy was given to registration, to be sent to Medical records. Ch also scanned an AD copy to [facility].Ch asked Pt and wife if they wanted prayer, and both agreed. Ch prayed with them for peace from these extreme depressive episodes.

## 2019-10-23 NOTE — Anesthesia Postprocedure Evaluation (Signed)
Anesthesia Post Note  Patient: Ethan Osborn  Procedure(s) Performed: ECT TX  Patient location during evaluation: PACU Anesthesia Type: General Level of consciousness: awake and alert Pain management: pain level controlled Vital Signs Assessment: post-procedure vital signs reviewed and stable Respiratory status: spontaneous breathing, nonlabored ventilation, respiratory function stable and patient connected to nasal cannula oxygen Cardiovascular status: blood pressure returned to baseline and stable Postop Assessment: no apparent nausea or vomiting Anesthetic complications: no     Last Vitals:  Vitals:   10/23/19 1050 10/23/19 1100  BP: (!) 141/74   Pulse: 77 63  Resp: (!) 21 14  Temp: (!) 36.4 C   SpO2: 93% 96%    Last Pain:  Vitals:   10/23/19 1100  TempSrc:   PainSc: 0-No pain                 Arita Miss

## 2019-10-23 NOTE — Anesthesia Preprocedure Evaluation (Signed)
Anesthesia Evaluation  Patient identified by MRN, date of birth, ID band Patient awake    Reviewed: Allergy & Precautions, NPO status , Patient's Chart, lab work & pertinent test results  History of Anesthesia Complications Negative for: history of anesthetic complications  Airway Mallampati: II  TM Distance: >3 FB Neck ROM: Full    Dental no notable dental hx. (+) Teeth Intact   Pulmonary sleep apnea and Continuous Positive Airway Pressure Ventilation , neg COPD, Patient abstained from smoking.Not current smoker,    Pulmonary exam normal breath sounds clear to auscultation       Cardiovascular Exercise Tolerance: Good METS(-) hypertension(-) CAD and (-) Past MI negative cardio ROS  (-) dysrhythmias  Rhythm:Regular Rate:Normal - Systolic murmurs    Neuro/Psych PSYCHIATRIC DISORDERS Anxiety Depression negative neurological ROS     GI/Hepatic neg GERD  ,(+)     (-) substance abuse  ,   Endo/Other  neg diabetes  Renal/GU negative Renal ROS     Musculoskeletal   Abdominal   Peds  Hematology   Anesthesia Other Findings Past Medical History: No date: Allergy No date: Anxiety No date: Arthritis     Comment:  right big toe  No date: Sleep apnea     Comment:  wears cpap   Reproductive/Obstetrics                             Anesthesia Physical Anesthesia Plan  ASA: II  Anesthesia Plan: General   Post-op Pain Management:    Induction: Intravenous  PONV Risk Score and Plan: 2 and Ondansetron and TIVA  Airway Management Planned: Mask  Additional Equipment: None  Intra-op Plan:   Post-operative Plan:   Informed Consent: I have reviewed the patients History and Physical, chart, labs and discussed the procedure including the risks, benefits and alternatives for the proposed anesthesia with the patient or authorized representative who has indicated his/her understanding and  acceptance.     Dental advisory given  Plan Discussed with: CRNA and Surgeon  Anesthesia Plan Comments: (Discussed risks of anesthesia with patient, including PONV, muscle aches. Rare risks discussed as well, such as cardiorespiratory sequelae, need for airway intervention. Patient understands.)        Anesthesia Quick Evaluation

## 2019-10-23 NOTE — Progress Notes (Signed)
Ch met with Pt and Pt's wife in response to PG by nurse to help assist in AD completion. Upon arrival, nurse and chaplain looked for witnesses to sign and complete AD for both Pt, and Pt's wife Ethan Osborn. Ch Waters helped in getting one witness Engineer, structural) from [place], and Lorenso Quarry found a volunteer to sign the AD. AD was notarized by Miss. Alveta Heimlich. Copies were given to Pt, and nurse to be placed in file. Pt's wife's copy was given to registration, to be sent to Medical records. Ch also scanned an AD copy to [facility].Ch asked Pt and wife if they wanted prayer, and both agreed. Ch prayed with them for peace from these extreme depressive episodes.

## 2019-10-23 NOTE — Procedures (Signed)
ECT SERVICES Physician's Interval Evaluation & Treatment Note  Patient Identification: Ethan Osborn MRN:  [id number] Date of Evaluation:  10/23/2019 TX #: 4  MADRS:   MMSE:   P.E. Findings:  No change physical exam  Psychiatric Interval Note:  Feels mood continues to improve  Subjective:  Patient is a 54 y.o. male seen for evaluation for Electroconvulsive Therapy. Mood is getting better.  Treatment Summary:   [x]   Right Unilateral             []  Bilateral   % Energy : 0.3 ms 60%   Impedance: 1730 ohms  Seizure Energy Index: 13,229 V squared  Postictal Suppression Index: 58%  Seizure Concordance Index: 96%  Medications  Pre Shock: Robinul 0.2 mg Brevital 100 mg succinylcholine 150 mg  Post Shock: 4 mg Versed and Haldol 5 mg  Seizure Duration: 28 seconds EMG 47 seconds EEG   Comments: Follow-up next treatment Monday  Lungs:  [x]   Clear to auscultation               []  Other:   Heart:    [x]   Regular rhythm             []  irregular rhythm    [x]   Previous H&P reviewed, patient examined and there are NO CHANGES                 []   Previous H&P reviewed, patient examined and there are changes noted.   Alethia Berthold, MD 3/12/20215:20 PM

## 2019-10-23 NOTE — H&P (Signed)
Ethan Osborn is an 54 y.o. male.   Chief Complaint: improved mood HPI: recurent and resistant depression  Past Medical History:  Diagnosis Date  . Allergy   . Anxiety   . Arthritis    right big toe   . Sleep apnea    wears cpap     Past Surgical History:  Procedure Laterality Date  . OPEN REDUCTION SHOULDER DISLOCATION    . WISDOM TOOTH EXTRACTION      Family History  Problem Relation Age of Onset  . Depression Mother   . Hearing loss Mother   . Colon polyps Mother   . Arthritis Father   . Diabetes Father   . Hearing loss Father   . Heart disease Father   . Hyperlipidemia Father   . Kidney disease Father   . Colon polyps Father   . Depression Sister   . Stroke Maternal Grandfather   . Cancer Paternal Grandmother   . Cancer Paternal Grandfather   . Colon cancer Neg Hx   . Esophageal cancer Neg Hx   . Stomach cancer Neg Hx   . Rectal cancer Neg Hx    Social History:  reports that he has never smoked. He has never used smokeless tobacco. He reports previous alcohol use. He reports current drug use. Drug: Marijuana.  Allergies:  Allergies  Allergen Reactions  . Penicillins Hives    Shortness of breath    (Not in a hospital admission)   No results found for this or any previous visit (from the past 48 hour(s)). No results found.  Review of Systems  Constitutional: Negative.   HENT: Negative.   Eyes: Negative.   Respiratory: Negative.   Cardiovascular: Negative.   Gastrointestinal: Negative.   Musculoskeletal: Negative.   Skin: Negative.   Neurological: Negative.   Psychiatric/Behavioral: Negative.     Blood pressure 139/84, pulse (!) 53, temperature 98.1 F (36.7 C), temperature source Oral, resp. rate 18, SpO2 99 %. Physical Exam  Nursing note and vitals reviewed. Constitutional: He appears well-developed and well-nourished.  HENT:  Head: Normocephalic and atraumatic.  Eyes: Pupils are equal, round, and reactive to light. Conjunctivae are  normal.  Cardiovascular: Regular rhythm and normal heart sounds.  Respiratory: Effort normal.  GI: Soft.  Musculoskeletal:        General: Normal range of motion.     Cervical back: Normal range of motion.  Neurological: He is alert.  Skin: Skin is warm and dry.  Psychiatric: He has a normal mood and affect. His behavior is normal. Judgment and thought content normal.     Assessment/Plan Continue Monday as he is still getting better  Alethia Berthold, MD 10/23/2019, 10:01 AM

## 2019-10-23 NOTE — Transfer of Care (Signed)
Immediate Anesthesia Transfer of Care Note  Patient: Ethan Osborn  Procedure(s) Performed: ECT TX  Patient Location: PACU  Anesthesia Type:General  Level of Consciousness: drowsy  Airway & Oxygen Therapy: Patient Spontanous Breathing and Patient connected to face mask oxygen  Post-op Assessment: Report given to RN and Post -op Vital signs reviewed and stable  Post vital signs: Reviewed and stable  Last Vitals:  Vitals Value Taken Time  BP 141/74 10/23/19 1051  Temp    Pulse 77 10/23/19 1051  Resp 19 10/23/19 1051  SpO2 93 % 10/23/19 1051  Vitals shown include unvalidated device data.  Last Pain:  Vitals:   10/23/19 0850  TempSrc:   PainSc: 0-No pain         Complications: No apparent anesthesia complications

## 2019-10-24 ENCOUNTER — Other Ambulatory Visit: Payer: Self-pay | Admitting: Psychiatry

## 2019-10-26 ENCOUNTER — Other Ambulatory Visit: Payer: Self-pay

## 2019-10-26 ENCOUNTER — Encounter: Payer: Self-pay | Admitting: Certified Registered"

## 2019-10-26 ENCOUNTER — Encounter (HOSPITAL_BASED_OUTPATIENT_CLINIC_OR_DEPARTMENT_OTHER)
Admission: RE | Admit: 2019-10-26 | Discharge: 2019-10-26 | Disposition: A | Discharge status: Home / Self Care | Payer: BC Managed Care – PPO | Source: Ambulatory Visit | Attending: Psychiatry | Admitting: Psychiatry

## 2019-10-26 DIAGNOSIS — F419 Anxiety disorder, unspecified: Secondary | ICD-10-CM | POA: Diagnosis not present

## 2019-10-26 DIAGNOSIS — G4730 Sleep apnea, unspecified: Secondary | ICD-10-CM | POA: Diagnosis not present

## 2019-10-26 DIAGNOSIS — F329 Major depressive disorder, single episode, unspecified: Secondary | ICD-10-CM | POA: Diagnosis not present

## 2019-10-26 DIAGNOSIS — M19071 Primary osteoarthritis, right ankle and foot: Secondary | ICD-10-CM | POA: Diagnosis not present

## 2019-10-26 DIAGNOSIS — F332 Major depressive disorder, recurrent severe without psychotic features: Secondary | ICD-10-CM | POA: Diagnosis not present

## 2019-10-26 DIAGNOSIS — Z20822 Contact with and (suspected) exposure to covid-19: Secondary | ICD-10-CM | POA: Diagnosis not present

## 2019-10-26 DIAGNOSIS — F418 Other specified anxiety disorders: Secondary | ICD-10-CM | POA: Diagnosis not present

## 2019-10-26 MED ORDER — SODIUM CHLORIDE 0.9 % IV SOLN
500.0000 mL | Freq: Once | INTRAVENOUS | Status: AC
  Administered 2019-10-26: 500 mL via INTRAVENOUS

## 2019-10-26 MED ORDER — MIDAZOLAM HCL 2 MG/2ML IJ SOLN
INTRAMUSCULAR | Status: AC
  Filled 2019-10-26: qty 4

## 2019-10-26 MED ORDER — GLYCOPYRROLATE 0.2 MG/ML IJ SOLN
INTRAMUSCULAR | Status: AC
  Administered 2019-10-26: 0.1 mg via INTRAVENOUS
  Filled 2019-10-26: qty 1

## 2019-10-26 MED ORDER — SODIUM CHLORIDE 0.9 % IV SOLN: INTRAVENOUS | Status: DC | PRN

## 2019-10-26 MED ORDER — SUCCINYLCHOLINE CHLORIDE 20 MG/ML IJ SOLN
INTRAMUSCULAR | Status: DC | PRN
  Administered 2019-10-26: 150 mg via INTRAVENOUS

## 2019-10-26 MED ORDER — HALOPERIDOL LACTATE 5 MG/ML IJ SOLN
INTRAMUSCULAR | Status: DC | PRN
  Administered 2019-10-26: 5 mg via INTRAVENOUS

## 2019-10-26 MED ORDER — GLYCOPYRROLATE 0.2 MG/ML IJ SOLN: 0.1000 mg | Freq: Once | INTRAMUSCULAR | Status: AC

## 2019-10-26 MED ORDER — HALOPERIDOL LACTATE 5 MG/ML IJ SOLN
INTRAMUSCULAR | Status: AC
  Filled 2019-10-26: qty 1

## 2019-10-26 MED ORDER — KETOROLAC TROMETHAMINE 30 MG/ML IJ SOLN
INTRAMUSCULAR | Status: AC
  Administered 2019-10-26: 30 mg via INTRAVENOUS
  Filled 2019-10-26: qty 1

## 2019-10-26 MED ORDER — MIDAZOLAM HCL 2 MG/2ML IJ SOLN
4.0000 mg | Freq: Once | INTRAMUSCULAR | Status: AC
  Administered 2019-10-26: 4 mg via INTRAVENOUS

## 2019-10-26 MED ORDER — METHOHEXITAL SODIUM 0.5 G IJ SOLR
INTRAMUSCULAR | Status: AC
  Filled 2019-10-26: qty 500

## 2019-10-26 MED ORDER — ONDANSETRON HCL 4 MG/2ML IJ SOLN: 4.0000 mg | Freq: Once | INTRAMUSCULAR | Status: AC

## 2019-10-26 MED ORDER — ONDANSETRON HCL 4 MG/2ML IJ SOLN
INTRAMUSCULAR | Status: AC
  Administered 2019-10-26: 4 mg via INTRAVENOUS
  Filled 2019-10-26: qty 2

## 2019-10-26 MED ORDER — KETOROLAC TROMETHAMINE 30 MG/ML IJ SOLN: 30.0000 mg | Freq: Once | INTRAMUSCULAR | Status: AC

## 2019-10-26 MED ORDER — SUCCINYLCHOLINE CHLORIDE 200 MG/10ML IV SOSY
PREFILLED_SYRINGE | INTRAVENOUS | Status: AC
  Filled 2019-10-26: qty 20

## 2019-10-26 MED ORDER — METHOHEXITAL SODIUM 100 MG/10ML IV SOSY
PREFILLED_SYRINGE | INTRAVENOUS | Status: DC | PRN
  Administered 2019-10-26: 100 mg via INTRAVENOUS

## 2019-10-26 NOTE — Discharge Instructions (Signed)
1)  The drugs that you have been given will stay in your system until tomorrow so for the       next 24 hours you should not:  A. Drive an automobile  B. Make any legal decisions  C. Drink any alcoholic beverages  2)  You may resume your regular meals upon return home.  3)  A responsible adult must take you home.  Someone should stay with you for a few          hours, then be available by phone for the remainder of the treatment day.  4)  You May experience any of the following symptoms:  Headache, Nausea and a dry mouth (due to the medications you were given),  temporary memory loss and some confusion, or sore muscles (a warm bath  should help this).  If you you experience any of these symptoms let us know on                your return visit.  5)  Report any of the following: any acute discomfort, severe headache, or temperature        greater than 100.5 F.   Also report any unusual redness, swelling, drainage, or pain         at your IV site.    You may report Symptoms to:  [facility]          Phone: [phone], ECT Department           or Dr. Prescott Gum office [phone]  6)  Your next ECT Treatment is Friday April 2 at 8:30   We will call 2 days prior to your scheduled appointment for arrival times.  7)  Nothing to eat or drink after midnight the night before your procedure.  8)  Take      With a sip of water the morning of your procedure.  9)  Other Instructions: Call [phone] to cancel the morning of your procedure due         to illness or emergency.  10) We will call within 72 hours to assess how you are feeling.

## 2019-10-26 NOTE — Anesthesia Postprocedure Evaluation (Signed)
Anesthesia Post Note  Patient: Nikoli Groening  Procedure(s) Performed: ECT TX  Patient location during evaluation: PACU Anesthesia Type: General Level of consciousness: awake and alert Pain management: pain level controlled Vital Signs Assessment: post-procedure vital signs reviewed and stable Respiratory status: spontaneous breathing, nonlabored ventilation, respiratory function stable and patient connected to nasal cannula oxygen Cardiovascular status: blood pressure returned to baseline and stable Postop Assessment: no apparent nausea or vomiting Anesthetic complications: no     Last Vitals:  Vitals:   10/26/19 1200 10/26/19 1210  BP: 139/79 (!) 153/86  Pulse: 78 83  Resp: 15 20  Temp:  36.8 C  SpO2: 100% 94%    Last Pain:  Vitals:   10/26/19 1210  TempSrc:   PainSc: 0-No pain                 Martha Clan

## 2019-10-26 NOTE — Anesthesia Preprocedure Evaluation (Signed)
Anesthesia Evaluation  Patient identified by MRN, date of birth, ID band Patient awake    Reviewed: Allergy & Precautions, NPO status , Patient's Chart, lab work & pertinent test results  History of Anesthesia Complications Negative for: history of anesthetic complications  Airway Mallampati: II  TM Distance: >3 FB Neck ROM: Full    Dental no notable dental hx. (+) Teeth Intact   Pulmonary sleep apnea and Continuous Positive Airway Pressure Ventilation , neg COPD, Patient abstained from smoking.Not current smoker,    Pulmonary exam normal breath sounds clear to auscultation       Cardiovascular Exercise Tolerance: Good METS(-) hypertension(-) CAD and (-) Past MI negative cardio ROS  (-) dysrhythmias  Rhythm:Regular Rate:Normal - Systolic murmurs    Neuro/Psych PSYCHIATRIC DISORDERS Anxiety Depression negative neurological ROS     GI/Hepatic neg GERD  ,(+)     (-) substance abuse  ,   Endo/Other  neg diabetes  Renal/GU negative Renal ROS     Musculoskeletal   Abdominal   Peds  Hematology   Anesthesia Other Findings Past Medical History: No date: Allergy No date: Anxiety No date: Arthritis     Comment:  right big toe  No date: Sleep apnea     Comment:  wears cpap   Reproductive/Obstetrics                             Anesthesia Physical  Anesthesia Plan  ASA: II  Anesthesia Plan: General   Post-op Pain Management:    Induction: Intravenous  PONV Risk Score and Plan: 2 and Ondansetron and TIVA  Airway Management Planned: Mask  Additional Equipment: None  Intra-op Plan:   Post-operative Plan:   Informed Consent: I have reviewed the patients History and Physical, chart, labs and discussed the procedure including the risks, benefits and alternatives for the proposed anesthesia with the patient or authorized representative who has indicated his/her understanding and  acceptance.     Dental advisory given  Plan Discussed with: CRNA and Surgeon  Anesthesia Plan Comments: (Discussed risks of anesthesia with patient, including PONV, muscle aches. Rare risks discussed as well, such as cardiorespiratory sequelae, need for airway intervention. Patient understands.)        Anesthesia Quick Evaluation

## 2019-10-26 NOTE — Transfer of Care (Signed)
Immediate Anesthesia Transfer of Care Note  Patient: Ethan Osborn  Procedure(s) Performed: ECT TX  Patient Location: PACU  Anesthesia Type:General  Level of Consciousness: drowsy, patient cooperative and responds to stimulation  Airway & Oxygen Therapy: Patient Spontanous Breathing and Patient connected to face mask oxygen  Post-op Assessment: Report given to RN and Post -op Vital signs reviewed and stable  Post vital signs: Reviewed and stable  Last Vitals:  Vitals Value Taken Time  BP    Temp    Pulse    Resp    SpO2      Last Pain:  Vitals:   10/26/19 0923  TempSrc:   PainSc: 0-No pain         Complications: No apparent anesthesia complications

## 2019-10-27 NOTE — Procedures (Signed)
ECT SERVICES Physician's Interval Evaluation & Treatment Note  Patient Identification: Ethan Osborn MRN:  [id number] Date of Evaluation:  10/26/2019 TX #: 5  MADRS:   MMSE:   P.E. Findings:  No change to physical exam  Psychiatric Interval Note:  Patient admits that he is feeling quite a bit better.  Smiling more.  No suicidal thoughts  Subjective:  Patient is a 54 y.o. male seen for evaluation for Electroconvulsive Therapy. Mild complaints of memory impairment  Treatment Summary:   [x]   Right Unilateral             []  Bilateral   % Energy : 0.3 ms 60%   Impedance: 1520 ohms  Seizure Energy Index: 13,874 V squared  Postictal Suppression Index: 46%  Seizure Concordance Index: 92%  Medications  Pre Shock: Robinul 0.2 mg Brevital 100 mg succinylcholine 150 mg  Post Shock: Versed 4 mg Haldol 5 mg  Seizure Duration: EMG 33 seconds EEG 54 seconds   Comments: Tolerated well.  As mentioned previously using this posttreatment medication has prevented delirium in all subsequent treatments after the first 1.  He is a little sedated in the afternoon but not terribly so.  Patient states that he feels that he has reached a plateau.  I think that he is anxious to be prepared to go back to work next Monday.  Discussed pros and cons of treatment but agreed to follow-up with him in 2 weeks.  Lungs:  [x]   Clear to auscultation               []  Other:   Heart:    [x]   Regular rhythm             []  irregular rhythm    [x]   Previous H&P reviewed, patient examined and there are NO CHANGES                 []   Previous H&P reviewed, patient examined and there are changes noted.   Alethia Berthold, MD 3/16/20213:33 PM

## 2019-10-29 DIAGNOSIS — F331 Major depressive disorder, recurrent, moderate: Secondary | ICD-10-CM | POA: Diagnosis not present

## 2019-10-30 ENCOUNTER — Ambulatory Visit: Payer: BC Managed Care – PPO | Admitting: Family Medicine

## 2019-10-30 ENCOUNTER — Other Ambulatory Visit: Payer: Self-pay

## 2019-10-30 ENCOUNTER — Encounter: Payer: Self-pay | Admitting: Family Medicine

## 2019-10-30 VITALS — BP 118/72 | HR 61 | Temp 98.2°F | Ht 71.0 in | Wt 233.4 lb

## 2019-10-30 DIAGNOSIS — R7989 Other specified abnormal findings of blood chemistry: Secondary | ICD-10-CM

## 2019-10-30 DIAGNOSIS — F331 Major depressive disorder, recurrent, moderate: Secondary | ICD-10-CM

## 2019-10-30 DIAGNOSIS — F332 Major depressive disorder, recurrent severe without psychotic features: Secondary | ICD-10-CM

## 2019-10-30 DIAGNOSIS — E1169 Type 2 diabetes mellitus with other specified complication: Secondary | ICD-10-CM | POA: Insufficient documentation

## 2019-10-30 DIAGNOSIS — F411 Generalized anxiety disorder: Secondary | ICD-10-CM

## 2019-10-30 DIAGNOSIS — R739 Hyperglycemia, unspecified: Secondary | ICD-10-CM | POA: Insufficient documentation

## 2019-10-30 DIAGNOSIS — R21 Rash and other nonspecific skin eruption: Secondary | ICD-10-CM

## 2019-10-30 DIAGNOSIS — E785 Hyperlipidemia, unspecified: Secondary | ICD-10-CM | POA: Insufficient documentation

## 2019-10-30 DIAGNOSIS — E1165 Type 2 diabetes mellitus with hyperglycemia: Secondary | ICD-10-CM | POA: Insufficient documentation

## 2019-10-30 LAB — HEMOGLOBIN A1C: Hgb A1c MFr Bld: 8.1 % — ABNORMAL HIGH (ref 4.6–6.5)

## 2019-10-30 MED ORDER — TESTOSTERONE 20.25 MG/1.25GM (1.62%) TD GEL: TRANSDERMAL | Status: DC

## 2019-10-30 MED ORDER — TRIAMCINOLONE ACETONIDE 0.5 % EX OINT: 1.0000 "application " | TOPICAL_OINTMENT | Freq: Two times a day (BID) | CUTANEOUS | 0 refills | Status: DC

## 2019-10-30 NOTE — Assessment & Plan Note (Signed)
Refill triamcinolone today.

## 2019-10-30 NOTE — Assessment & Plan Note (Signed)
Recheck A1c.  Patient does not want to start medication at this point.  Continue lifestyle changes.  Follow-up in 3 to 6 months depending on results of A1c.

## 2019-10-30 NOTE — Assessment & Plan Note (Signed)
Last testosterone 411.  Continue replacement at previous dose.

## 2019-10-30 NOTE — Assessment & Plan Note (Signed)
Discussed long-term benefit of statin medication.  They will continue Lipitor 10 mg daily for now.  Will recheck lipid panel in 1 year.  May be able to stop in the future depending on blood sugar and cholesterol levels.

## 2019-10-30 NOTE — Assessment & Plan Note (Signed)
Stable.  Continue management per psychiatry. 

## 2019-10-30 NOTE — Patient Instructions (Signed)
It was very nice to see you today!  We will check your blood sugar.  We will continue the cholesterol medication for now.  If we are able to reduce your blood sugar we will be able to stop this at some point in future.  Please continue the previous dose of testosterone.  I will refill the triamcinolone for your rash today.  We will see you back in 3 to 6 months depending on what your blood sugar shows.  Take care, Dr [name]  Please try these tips to maintain a healthy lifestyle:   Eat at least 3 REAL meals and 1-2 snacks per day.  Aim for no more than 5 hours between eating.  If you eat breakfast, please do so within one hour of getting up.    Each meal should contain half fruits/vegetables, one quarter protein, and one quarter carbs (no bigger than a computer mouse)   Cut down on sweet beverages. This includes juice, soda, and sweet tea.     Drink at least 1 glass of water with each meal and aim for at least 8 glasses per day   Exercise at least 150 minutes every week.

## 2019-10-30 NOTE — Progress Notes (Signed)
   Ethan Osborn is a 54 y.o. male who presents today for an office visit.  Assessment/Plan:  Chronic Problems Addressed Today: MDD (major depressive disorder), recurrent episode, severe (HCC) Stable.  Continue management per psychiatry.  Low testosterone Last testosterone 411.  Continue replacement at previous dose.  Rash Refill triamcinolone today.  GAD (generalized anxiety disorder) Stable.  Continue management per psychiatry.  Dyslipidemia Discussed long-term benefit of statin medication.  They will continue Lipitor 10 mg daily for now.  Will recheck lipid panel in 1 year.  May be able to stop in the future depending on blood sugar and cholesterol levels.  Hyperglycemia Recheck A1c.  Patient does not want to start medication at this point.  Continue lifestyle changes.  Follow-up in 3 to 6 months depending on results of A1c.     Subjective:  HPI:  Patient is here for hospitalization follow-up.  He was recently admitted to the hospital with suicidal ideation.  He underwent ECT while hospitalized.  He has been under quite a bit of stress and anxiety recently.  Been looking for a new job and will be starting a new job next week.  He has had some improvement with ECT.  He is currently on olanzapine-duloxetine per psychiatry.  He will also be starting him on lithium.  He has also had ketamine therapy recently which worked very well for him.  While he is hospitalized he was found to have elevated cholesterol level and elevated blood sugar.  They recommended starting medication for elevated blood sugar however he deferred.  He was also started on Lipitor 10 mg daily.  They increase his dose of testosterone as well.  He request refill on triamcinolone today.       Objective:  Physical Exam: BP 118/72   Pulse 61   Temp 98.2 F (36.8 C)   Ht 5\' 11"  (1.803 m)   Wt 233 lb 6.1 oz (105.9 kg)   SpO2 96%   BMI 32.55 kg/m   Gen: No acute distress, resting comfortably CV: Regular  rate and rhythm with no murmurs appreciated Pulm: Normal work of breathing, clear to auscultation bilaterally with no crackles, wheezes, or rhonchi Neuro: Grossly normal, moves all extremities Psych: Normal affect and thought content      Yenifer Saccente M. [name], MD 10/30/2019 9:41 AM

## 2019-10-30 NOTE — Progress Notes (Signed)
Please inform patient of the following:  A1c elevated 8.1.  This is in the diabetic range.  Recommend he come back in 3 months to recheck.  Please schedule office visit.

## 2019-11-04 DIAGNOSIS — F331 Major depressive disorder, recurrent, moderate: Secondary | ICD-10-CM | POA: Diagnosis not present

## 2019-11-07 ENCOUNTER — Ambulatory Visit: Payer: BC Managed Care – PPO | Attending: Internal Medicine

## 2019-11-07 DIAGNOSIS — Z23 Encounter for immunization: Secondary | ICD-10-CM

## 2019-11-07 NOTE — Progress Notes (Signed)
   Covid-19 Vaccination Clinic  Name:  Ethan Osborn    MRN: [id number] DOB: Dec 17, 1965  11/07/2019  Ethan Osborn was observed post Covid-19 immunization for 15 minutes without incident. He was provided with Vaccine Information Sheet and instruction to access the V-Safe system.   Ethan Osborn was instructed to call 911 with any severe reactions post vaccine: [name] Kitchen Difficulty breathing  . Swelling of face and throat  . A fast heartbeat  . A bad rash all over body  . Dizziness and weakness   Immunizations Administered    Name Date Dose VIS Date Route   Pfizer COVID-19 Vaccine 11/07/2019  9:36 AM 0.3 mL 07/24/2019 Intramuscular   Manufacturer: [place]   Lot: [id number]   [place]: [id number]

## 2019-11-11 ENCOUNTER — Telehealth: Payer: Self-pay

## 2019-11-13 DIAGNOSIS — F332 Major depressive disorder, recurrent severe without psychotic features: Secondary | ICD-10-CM | POA: Diagnosis not present

## 2019-11-16 ENCOUNTER — Other Ambulatory Visit: Payer: Self-pay | Admitting: Psychiatry

## 2019-11-16 DIAGNOSIS — F331 Major depressive disorder, recurrent, moderate: Secondary | ICD-10-CM | POA: Diagnosis not present

## 2019-11-23 DIAGNOSIS — F331 Major depressive disorder, recurrent, moderate: Secondary | ICD-10-CM | POA: Diagnosis not present

## 2019-11-24 DIAGNOSIS — F332 Major depressive disorder, recurrent severe without psychotic features: Secondary | ICD-10-CM | POA: Diagnosis not present

## 2019-11-30 DIAGNOSIS — F331 Major depressive disorder, recurrent, moderate: Secondary | ICD-10-CM | POA: Diagnosis not present

## 2019-12-01 ENCOUNTER — Other Ambulatory Visit: Payer: Self-pay | Admitting: Family Medicine

## 2019-12-01 NOTE — Telephone Encounter (Signed)
Pt requesting refill on Testosterone. Last OV 10/30/2019.

## 2019-12-02 ENCOUNTER — Ambulatory Visit: Payer: BC Managed Care – PPO | Attending: Internal Medicine

## 2019-12-02 DIAGNOSIS — Z23 Encounter for immunization: Secondary | ICD-10-CM

## 2019-12-02 NOTE — Progress Notes (Signed)
   Covid-19 Vaccination Clinic  Name:  Strider Ali    MRN: [id number] DOB: [birth year]  12/02/2019  Mr. Rosie was observed post Covid-19 immunization for 15 minutes without incident. He was provided with Vaccine Information Sheet and instruction to access the V-Safe system.   Mr. Latsko was instructed to call 911 with any severe reactions post vaccine: [name] Kitchen Difficulty breathing  . Swelling of face and throat  . A fast heartbeat  . A bad rash all over body  . Dizziness and weakness   Immunizations Administered    Name Date Dose VIS Date Route   Pfizer COVID-19 Vaccine 12/02/2019  4:00 PM 0.3 mL 10/07/2018 Intramuscular   Manufacturer: [place]   Lot: [id number]   [place]: [id number]

## 2019-12-07 DIAGNOSIS — F331 Major depressive disorder, recurrent, moderate: Secondary | ICD-10-CM | POA: Diagnosis not present

## 2019-12-09 DIAGNOSIS — F4010 Social phobia, unspecified: Secondary | ICD-10-CM | POA: Diagnosis not present

## 2019-12-09 DIAGNOSIS — F332 Major depressive disorder, recurrent severe without psychotic features: Secondary | ICD-10-CM | POA: Diagnosis not present

## 2019-12-09 DIAGNOSIS — F410 Panic disorder [episodic paroxysmal anxiety] without agoraphobia: Secondary | ICD-10-CM | POA: Diagnosis not present

## 2019-12-10 DIAGNOSIS — F332 Major depressive disorder, recurrent severe without psychotic features: Secondary | ICD-10-CM | POA: Diagnosis not present

## 2019-12-13 ENCOUNTER — Other Ambulatory Visit: Payer: Self-pay | Admitting: Psychiatry

## 2019-12-14 DIAGNOSIS — F331 Major depressive disorder, recurrent, moderate: Secondary | ICD-10-CM | POA: Diagnosis not present

## 2019-12-15 DIAGNOSIS — F332 Major depressive disorder, recurrent severe without psychotic features: Secondary | ICD-10-CM | POA: Diagnosis not present

## 2019-12-17 DIAGNOSIS — F332 Major depressive disorder, recurrent severe without psychotic features: Secondary | ICD-10-CM | POA: Diagnosis not present

## 2019-12-21 DIAGNOSIS — F332 Major depressive disorder, recurrent severe without psychotic features: Secondary | ICD-10-CM | POA: Diagnosis not present

## 2019-12-22 ENCOUNTER — Encounter: Payer: Self-pay | Admitting: Family Medicine

## 2019-12-23 ENCOUNTER — Other Ambulatory Visit: Payer: Self-pay

## 2019-12-23 MED ORDER — ATORVASTATIN CALCIUM 10 MG PO TABS: 10.0000 mg | ORAL_TABLET | Freq: Every day | ORAL | 0 refills | Status: DC

## 2019-12-24 DIAGNOSIS — F332 Major depressive disorder, recurrent severe without psychotic features: Secondary | ICD-10-CM | POA: Diagnosis not present

## 2019-12-28 DIAGNOSIS — F332 Major depressive disorder, recurrent severe without psychotic features: Secondary | ICD-10-CM | POA: Diagnosis not present

## 2019-12-31 DIAGNOSIS — F332 Major depressive disorder, recurrent severe without psychotic features: Secondary | ICD-10-CM | POA: Diagnosis not present

## 2020-01-01 ENCOUNTER — Other Ambulatory Visit: Payer: Self-pay | Admitting: Family Medicine

## 2020-01-01 NOTE — Telephone Encounter (Signed)
LAST APPOINTMENT DATE:10/30/2019  NEXT APPOINTMENT DATE: Visit date not found  Rx Testosterone   LAST REFILL:12/01/2019  QTY:

## 2020-01-04 DIAGNOSIS — F332 Major depressive disorder, recurrent severe without psychotic features: Secondary | ICD-10-CM | POA: Diagnosis not present

## 2020-01-05 DIAGNOSIS — F332 Major depressive disorder, recurrent severe without psychotic features: Secondary | ICD-10-CM | POA: Diagnosis not present

## 2020-01-07 DIAGNOSIS — F332 Major depressive disorder, recurrent severe without psychotic features: Secondary | ICD-10-CM | POA: Diagnosis not present

## 2020-01-14 DIAGNOSIS — F332 Major depressive disorder, recurrent severe without psychotic features: Secondary | ICD-10-CM | POA: Diagnosis not present

## 2020-01-18 DIAGNOSIS — F332 Major depressive disorder, recurrent severe without psychotic features: Secondary | ICD-10-CM | POA: Diagnosis not present

## 2020-01-19 DIAGNOSIS — F332 Major depressive disorder, recurrent severe without psychotic features: Secondary | ICD-10-CM | POA: Diagnosis not present

## 2020-01-20 DIAGNOSIS — F332 Major depressive disorder, recurrent severe without psychotic features: Secondary | ICD-10-CM | POA: Diagnosis not present

## 2020-01-21 DIAGNOSIS — F332 Major depressive disorder, recurrent severe without psychotic features: Secondary | ICD-10-CM | POA: Diagnosis not present

## 2020-01-22 DIAGNOSIS — F332 Major depressive disorder, recurrent severe without psychotic features: Secondary | ICD-10-CM | POA: Diagnosis not present

## 2020-01-23 ENCOUNTER — Encounter: Payer: Self-pay | Admitting: Family Medicine

## 2020-01-25 ENCOUNTER — Other Ambulatory Visit: Payer: Self-pay | Admitting: *Deleted

## 2020-01-25 DIAGNOSIS — G4730 Sleep apnea, unspecified: Secondary | ICD-10-CM

## 2020-01-25 DIAGNOSIS — F332 Major depressive disorder, recurrent severe without psychotic features: Secondary | ICD-10-CM | POA: Diagnosis not present

## 2020-01-25 NOTE — Telephone Encounter (Signed)
Referral placed.

## 2020-01-25 NOTE — Telephone Encounter (Signed)
Please advise 

## 2020-01-26 DIAGNOSIS — F332 Major depressive disorder, recurrent severe without psychotic features: Secondary | ICD-10-CM | POA: Diagnosis not present

## 2020-01-27 DIAGNOSIS — F332 Major depressive disorder, recurrent severe without psychotic features: Secondary | ICD-10-CM | POA: Diagnosis not present

## 2020-01-28 DIAGNOSIS — F332 Major depressive disorder, recurrent severe without psychotic features: Secondary | ICD-10-CM | POA: Diagnosis not present

## 2020-01-29 DIAGNOSIS — F332 Major depressive disorder, recurrent severe without psychotic features: Secondary | ICD-10-CM | POA: Diagnosis not present

## 2020-02-01 DIAGNOSIS — F332 Major depressive disorder, recurrent severe without psychotic features: Secondary | ICD-10-CM | POA: Diagnosis not present

## 2020-02-02 DIAGNOSIS — F332 Major depressive disorder, recurrent severe without psychotic features: Secondary | ICD-10-CM | POA: Diagnosis not present

## 2020-02-03 DIAGNOSIS — F332 Major depressive disorder, recurrent severe without psychotic features: Secondary | ICD-10-CM | POA: Diagnosis not present

## 2020-02-04 DIAGNOSIS — F332 Major depressive disorder, recurrent severe without psychotic features: Secondary | ICD-10-CM | POA: Diagnosis not present

## 2020-02-08 DIAGNOSIS — F332 Major depressive disorder, recurrent severe without psychotic features: Secondary | ICD-10-CM | POA: Diagnosis not present

## 2020-02-09 DIAGNOSIS — F332 Major depressive disorder, recurrent severe without psychotic features: Secondary | ICD-10-CM | POA: Diagnosis not present

## 2020-02-10 DIAGNOSIS — F332 Major depressive disorder, recurrent severe without psychotic features: Secondary | ICD-10-CM | POA: Diagnosis not present

## 2020-02-11 ENCOUNTER — Other Ambulatory Visit: Payer: Self-pay

## 2020-02-11 ENCOUNTER — Ambulatory Visit: Payer: BC Managed Care – PPO | Admitting: Neurology

## 2020-02-11 ENCOUNTER — Encounter: Payer: Self-pay | Admitting: Neurology

## 2020-02-11 VITALS — BP 112/64 | HR 59 | Ht 71.0 in | Wt 234.0 lb

## 2020-02-11 DIAGNOSIS — Z820 Family history of epilepsy and other diseases of the nervous system: Secondary | ICD-10-CM | POA: Diagnosis not present

## 2020-02-11 DIAGNOSIS — F332 Major depressive disorder, recurrent severe without psychotic features: Secondary | ICD-10-CM | POA: Diagnosis not present

## 2020-02-11 DIAGNOSIS — G4733 Obstructive sleep apnea (adult) (pediatric): Secondary | ICD-10-CM

## 2020-02-11 DIAGNOSIS — R7989 Other specified abnormal findings of blood chemistry: Secondary | ICD-10-CM | POA: Diagnosis not present

## 2020-02-11 DIAGNOSIS — E669 Obesity, unspecified: Secondary | ICD-10-CM

## 2020-02-11 DIAGNOSIS — Z9989 Dependence on other enabling machines and devices: Secondary | ICD-10-CM

## 2020-02-11 NOTE — Progress Notes (Signed)
Subjective:    Patient ID: Ethan Osborn is a 54 y.o. male.  HPI     Star Age, MD, PhD [facility] [address], [address] [place], [place] [address]  Dear Dr. [name],   I saw your patient, Ethan Osborn, upon your kind request, in my sleep clinic today for initial consultation of his sleep disorder, in particular, evaluation of his prior diagnosis of obstructive sleep apnea. The patient is unaccompanied today. As you know, Ethan Osborn is a 54 year old right handed gentleman with an underlying medical history of allergies, arthritis, anxiety, low T, hyperglycemia and obesity, who was diagnosed with OSA some 15 years ago and placed on CPAP therapy. Prior sleep study results are not available for my review today.  Sleep study was out[place]of[place] in [place].  He moved to [place] about 5 years ago and has had surplus of supplies, this particular machine is about 75+ years old and he uses it every night.  He is indeed fully compliant with treatment as I was able to review his compliance data.  In the past month from 01/12/2020 through 02/10/2020 he has used his machine every night with percent use days greater than 4 hours at 100%, indicating superb compliance with an average usage at 8 hours and 8 minutes, residual AHI at goal at 3.2/h, 95th percentile of pressure at 10.7 cm with a range of 5-20 which is the default setting on his AutoPap machine.  Leak is low with a 95th percentile at 3.7 L/min, he uses a large ResMed Swift fracture nasal pillows interface.  He has changed his filters on a regular basis, he does not have a local DME company.  I reviewed your office note from 10/30/19. His Epworth sleepiness score is 8 out of 24 today, fatigue severity score is 33 out of 63.  He recalls being told he had severe sleep apnea.  Prior to treatment he had significant nocturia, no recurrent morning headaches, both parents have sleep apnea.  He has had improvement in his  symptoms when he started treatment.  He has nocturia about once or twice per average night and denies any recurrent morning headaches currently.  He goes to bed generally between 930 and 10 and rise time is around 630.  He works as a Conservation officer, historic buildings.  He lives with his wife and younger daughter, age 35, he has an older daughter age 65 in college at [employer].  He is a non-smoker and drinks alcohol occasionally, on the weekend, caffeine in the form of hot tea, 1 cup in the morning.   His Past Medical History Is Significant For: Past Medical History:  Diagnosis Date  . Allergy   . Anxiety   . Arthritis    right big toe   . Sleep apnea    wears cpap     His Past Surgical History Is Significant For: Past Surgical History:  Procedure Laterality Date  . OPEN REDUCTION SHOULDER DISLOCATION    . WISDOM TOOTH EXTRACTION      His Family History Is Significant For: Family History  Problem Relation Age of Onset  . Depression Mother   . Hearing loss Mother   . Colon polyps Mother   . Arthritis Father   . Diabetes Father   . Hearing loss Father   . Heart disease Father   . Hyperlipidemia Father   . Kidney disease Father   . Colon polyps Father   . Depression Sister   .  Stroke Maternal Grandfather   . Cancer Paternal Grandmother   . Cancer Paternal Grandfather   . Colon cancer Neg Hx   . Esophageal cancer Neg Hx   . Stomach cancer Neg Hx   . Rectal cancer Neg Hx     His Social History Is Significant For: Social History   Socioeconomic History  . Marital status: Married    Spouse name: Not on file  . Number of children: Not on file  . Years of education: Not on file  . Highest education level: Not on file  Occupational History  . Occupation: Armed forces technical officer   Tobacco Use  . Smoking status: Never Smoker  . Smokeless tobacco: Never Used  Vaping Use  . Vaping Use: Never used  Substance and Sexual Activity  . Alcohol use: Not Currently    Comment: social   . Drug use:  Yes    Types: Marijuana    Comment: twice monthly  . Sexual activity: Yes  Other Topics Concern  . Not on file  Social History Narrative  . Not on file   Social Determinants of Health   Financial Resource Strain:   . Difficulty of Paying Living Expenses:   Food Insecurity:   . Worried About Charity fundraiser in the Last Year:   . Arboriculturist in the Last Year:   Transportation Needs:   . Film/video editor (Medical):   [name] Kitchen Lack of Transportation (Non-Medical):   Physical Activity:   . Days of Exercise per Week:   . Minutes of Exercise per Session:   Stress:   . Feeling of Stress :   Social Connections:   . Frequency of Communication with Friends and Family:   . Frequency of Social Gatherings with Friends and Family:   . Attends Religious Services:   . Active Member of Clubs or Organizations:   . Attends Archivist Meetings:   [name] Kitchen Marital Status:     His Allergies Are:  Allergies  Allergen Reactions  . Penicillins Hives    Shortness of breath  :   His Current Medications Are:  Outpatient Encounter Medications as of 02/11/2020  Medication Sig  . atorvastatin (LIPITOR) 10 MG tablet Take 1 tablet (10 mg total) by mouth daily at 6 PM.  . OLANZapine-FLUoxetine (SYMBYAX) 6-25 MG capsule Take 1 capsule by mouth at bedtime.  . Testosterone 20.25 MG/1.25GM (1.62%) GEL Place 2 pumps onto skin daily.  . Testosterone 20.25 MG/ACT (1.62%) GEL PLACE 3 PUMPS TO UPPER ARM AND SHOULDER ONCE A DAY  . traZODone (DESYREL) 50 MG tablet TAKE 1 TABLET AT BEDTIME AS NEEDED FOR SLEEP  . triamcinolone ointment (KENALOG) 0.5 % Apply 1 application topically 2 (two) times daily.   No facility-administered encounter medications on file as of 02/11/2020.  :  Review of Systems:  Out of a complete 14 point review of systems, all are reviewed and negative with the exception of these symptoms as listed below: Review of Systems  Neurological:       Here for sleep consult.  Sleep study  15 + years ago. Currently on CPAP.  Epworth Sleepiness Scale 0= would never doze 1= slight chance of dozing 2= moderate chance of dozing 3= high chance of dozing  Sitting and reading:1 Watching TV:1 Sitting inactive in a public place (ex. Theater or meeting):1 As a passenger in a car for an hour without a break:2 Lying down to rest in the afternoon:2 Sitting and talking to someone:0 Sitting  quietly after lunch (no alcohol):1 In a car, while stopped in traffic:0 Total:8     Objective:  Neurological Exam  Physical Exam Physical Examination:   Vitals:   02/11/20 0954  BP: 112/64  Pulse: (!) 59  SpO2: 96%    General Examination: The patient is a very pleasant 54 y.o. male in no acute distress. He appears well-developed and well-nourished and well groomed.   HEENT: Normocephalic, atraumatic, pupils are equal, round and reactive to light, extraocular tracking is good without limitation to gaze excursion or nystagmus noted. Hearing is grossly intact. Face is symmetric with normal facial animation. Speech is clear with no dysarthria noted. There is no hypophonia. There is no lip, neck/head, jaw or voice tremor. Neck is supple with full range of passive and active motion. There are no carotid bruits on auscultation. Oropharynx exam reveals: mild mouth dryness, adequate dental hygiene and moderate airway crowding, due to small airway entry, Mallampati is class III and uvula is not fully visualized, tonsils appear to be on the smaller side, not completely visualized.  Tongue protrudes centrally in palate elevates symmetrically.  He has a mild overbite.  Neck circumference is 19-7/8 inches.  Chest: Clear to auscultation without wheezing, rhonchi or crackles noted.  Heart: S1+S2+0, regular and normal without murmurs, rubs or gallops noted. Slight bradycardia.  Abdomen: Soft, non-tender and non-distended with normal bowel sounds appreciated on auscultation.  Extremities: There is no  pitting edema in the distal lower extremities bilaterally.   Skin: Warm and dry without trophic changes noted.   Musculoskeletal: exam reveals no obvious joint deformities, tenderness or joint swelling or erythema.   Neurologically:  Mental status: The patient is awake, alert and oriented in all 4 spheres. His immediate and remote memory, attention, language skills and fund of knowledge are appropriate. There is no evidence of aphasia, agnosia, apraxia or anomia. Speech is clear with normal prosody and enunciation. Thought process is linear. Mood is normal and affect is normal.  Cranial nerves II - XII are as described above under HEENT exam.  Motor exam: Normal bulk, strength and tone is noted. There is no tremor, Romberg is negative. Fine motor skills and coordination: grossly intact.  Cerebellar testing: No dysmetria or intention tremor. There is no truncal or gait ataxia.  Sensory exam: intact to light touch in the upper and lower extremities.  Gait, station and balance: He stands easily. No veering to one side is noted. No leaning to one side is noted. Posture is age-appropriate and stance is narrow based. Gait shows normal stride length and normal pace. No problems turning are noted. Tandem walk is unremarkable.                Assessment and Plan:  In summary, Ethan Osborn is a very pleasant 54 y.o.-year old male with an underlying medical history of allergies, arthritis, anxiety, low T, hyperglycemia and obesity, who presents for evaluation of his prior diagnosis of obstructive sleep apnea which was deemed to be in the severe range per patient report.  He has been on AutoPap therapy, current machine is his second machine, but is also older, at least 52 years old as he recalls.  Sleep study testing was [place] out of state and results are not available.  We reviewed his compliance data today, he is commended on his full compliance with AutoPap therapy.  Sleep apnea appears to be under good control  and he has benefited from treatment and reports complete compliance.  He would benefit  from reevaluation as it has been several years and he should be eligible for a new machine and equipment.  To that end, I suggested we proceed with a home sleep test to reevaluate his sleep apnea and I would like to prescribe a new AutoPap machine after testing as well as new supplies and establishing with a local DME company.  We talked about the importance of treating sleep apnea and the importance of full compliance.  He is advised that he will need a follow-up appointment in 3 months after starting new equipment, so to also fulfill insurance compliance criteria.  He is advised to continue to work on weight loss.  He is agreeable with the plan and I plan to see him back after home testing.  We will also keep him posted as to his test results by phone call in the interim.  I answered all his questions today and he was in agreement.  Thank you very much for allowing me to participate in the care of this nice patient. If I can be of any further assistance to you please do not hesitate to call me at [phone].  Sincerely,   Star Age, MD, PhD

## 2020-02-11 NOTE — Patient Instructions (Addendum)
Thank you for choosing [facility] for your sleep related care! It was nice to meet you today! I appreciate that you entrust me with your sleep related healthcare concerns. I hope, I was able to address at least some of your concerns today, and that I can help you feel reassured and also get better.    Here is what we discussed today and what we came up with as our plan for you:    Based on your symptoms and your exam I believe you are still at risk for obstructive sleep apnea and would benefit from reevaluation as it has been many years and you need new supplies and an updated machine. As explained, you are currently using a so called AutoPap machine.  As discussed, we will proceed with a home sleep test to reexamine your sleep apnea diagnosis and qualify you for a new prescription for supplies and machine.    You are very compliant with your current machine, keep up the good work.  Continue to work on weight loss.    Please remember, the risks and ramifications of moderate to severe obstructive sleep apnea or OSA are: Cardiovascular disease, including congestive heart failure, stroke, difficult to control hypertension, arrhythmias, and even type 2 diabetes has been linked to untreated OSA. Sleep apnea causes disruption of sleep and sleep deprivation in most cases, which, in turn, can cause recurrent headaches, problems with memory, mood, concentration, focus, and vigilance. Most people with untreated sleep apnea report excessive daytime sleepiness, which can affect their ability to drive. Please do not drive if you feel sleepy.   I will likely see you back after your sleep study to go over the test results and where to go from there. We will call you after your sleep study to advise about the results (most likely, you will hear from [facility] my nurse) and to set up an appointment at the time, as necessary.    Our sleep lab administrative assistant will call you to schedule your sleep  study. If you don't hear back from her by about 2 weeks from now, please feel free to call her at [phone]. You can leave a message with your phone number and concerns, if you get the voicemail box. She will call back as soon as possible.

## 2020-02-12 DIAGNOSIS — F332 Major depressive disorder, recurrent severe without psychotic features: Secondary | ICD-10-CM | POA: Diagnosis not present

## 2020-02-16 ENCOUNTER — Other Ambulatory Visit: Payer: Self-pay | Admitting: Psychiatry

## 2020-02-16 DIAGNOSIS — F332 Major depressive disorder, recurrent severe without psychotic features: Secondary | ICD-10-CM | POA: Diagnosis not present

## 2020-02-17 DIAGNOSIS — F332 Major depressive disorder, recurrent severe without psychotic features: Secondary | ICD-10-CM | POA: Diagnosis not present

## 2020-02-18 DIAGNOSIS — F332 Major depressive disorder, recurrent severe without psychotic features: Secondary | ICD-10-CM | POA: Diagnosis not present

## 2020-02-19 DIAGNOSIS — F332 Major depressive disorder, recurrent severe without psychotic features: Secondary | ICD-10-CM | POA: Diagnosis not present

## 2020-02-22 DIAGNOSIS — F332 Major depressive disorder, recurrent severe without psychotic features: Secondary | ICD-10-CM | POA: Diagnosis not present

## 2020-02-23 DIAGNOSIS — F332 Major depressive disorder, recurrent severe without psychotic features: Secondary | ICD-10-CM | POA: Diagnosis not present

## 2020-02-24 DIAGNOSIS — F332 Major depressive disorder, recurrent severe without psychotic features: Secondary | ICD-10-CM | POA: Diagnosis not present

## 2020-02-25 DIAGNOSIS — F332 Major depressive disorder, recurrent severe without psychotic features: Secondary | ICD-10-CM | POA: Diagnosis not present

## 2020-02-26 DIAGNOSIS — F332 Major depressive disorder, recurrent severe without psychotic features: Secondary | ICD-10-CM | POA: Diagnosis not present

## 2020-02-27 IMAGING — DX DG SHOULDER 2+V*R*
2 series · 2 of 2 positions shown · non-contrast
Comparison: None.

CLINICAL DATA: Acute pain of right shoulder. Pain began water
rafting.

EXAM:
RIGHT SHOULDER - 2+ VIEW

[shoulder grashey ap]
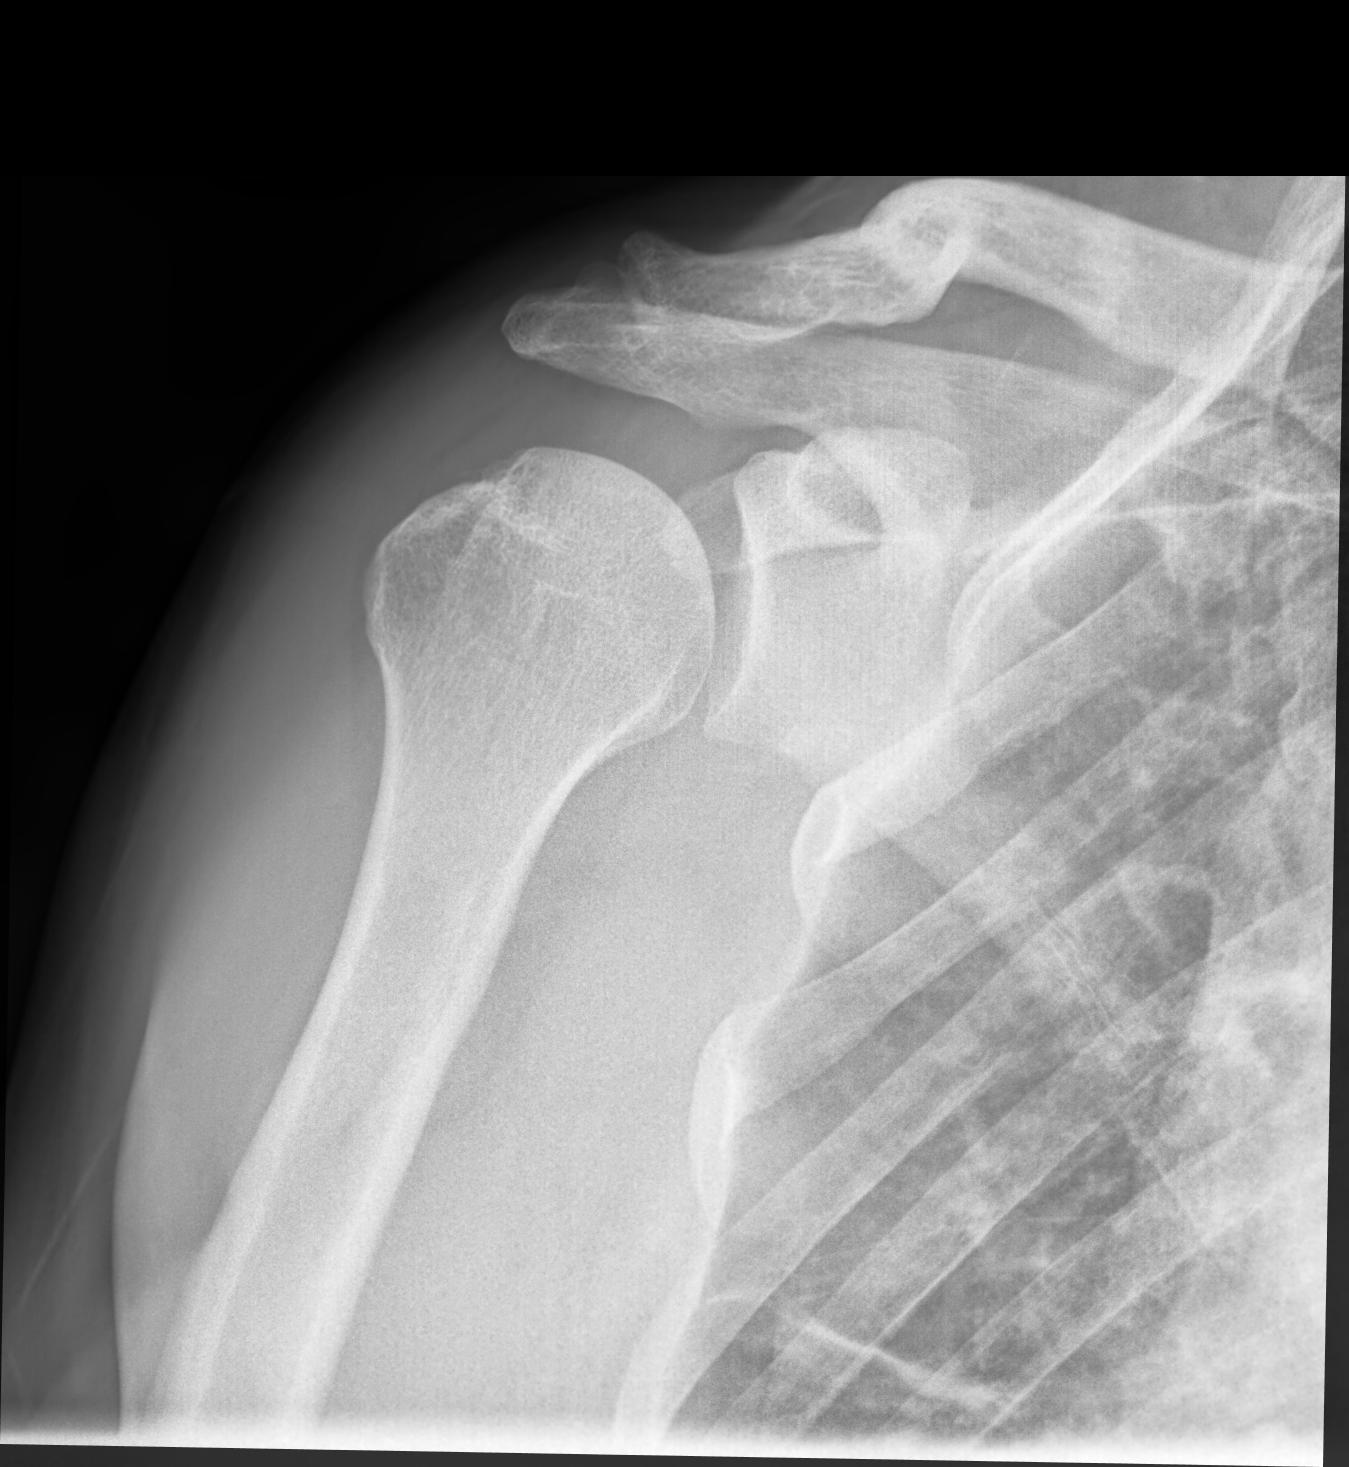

[shoulder y view]
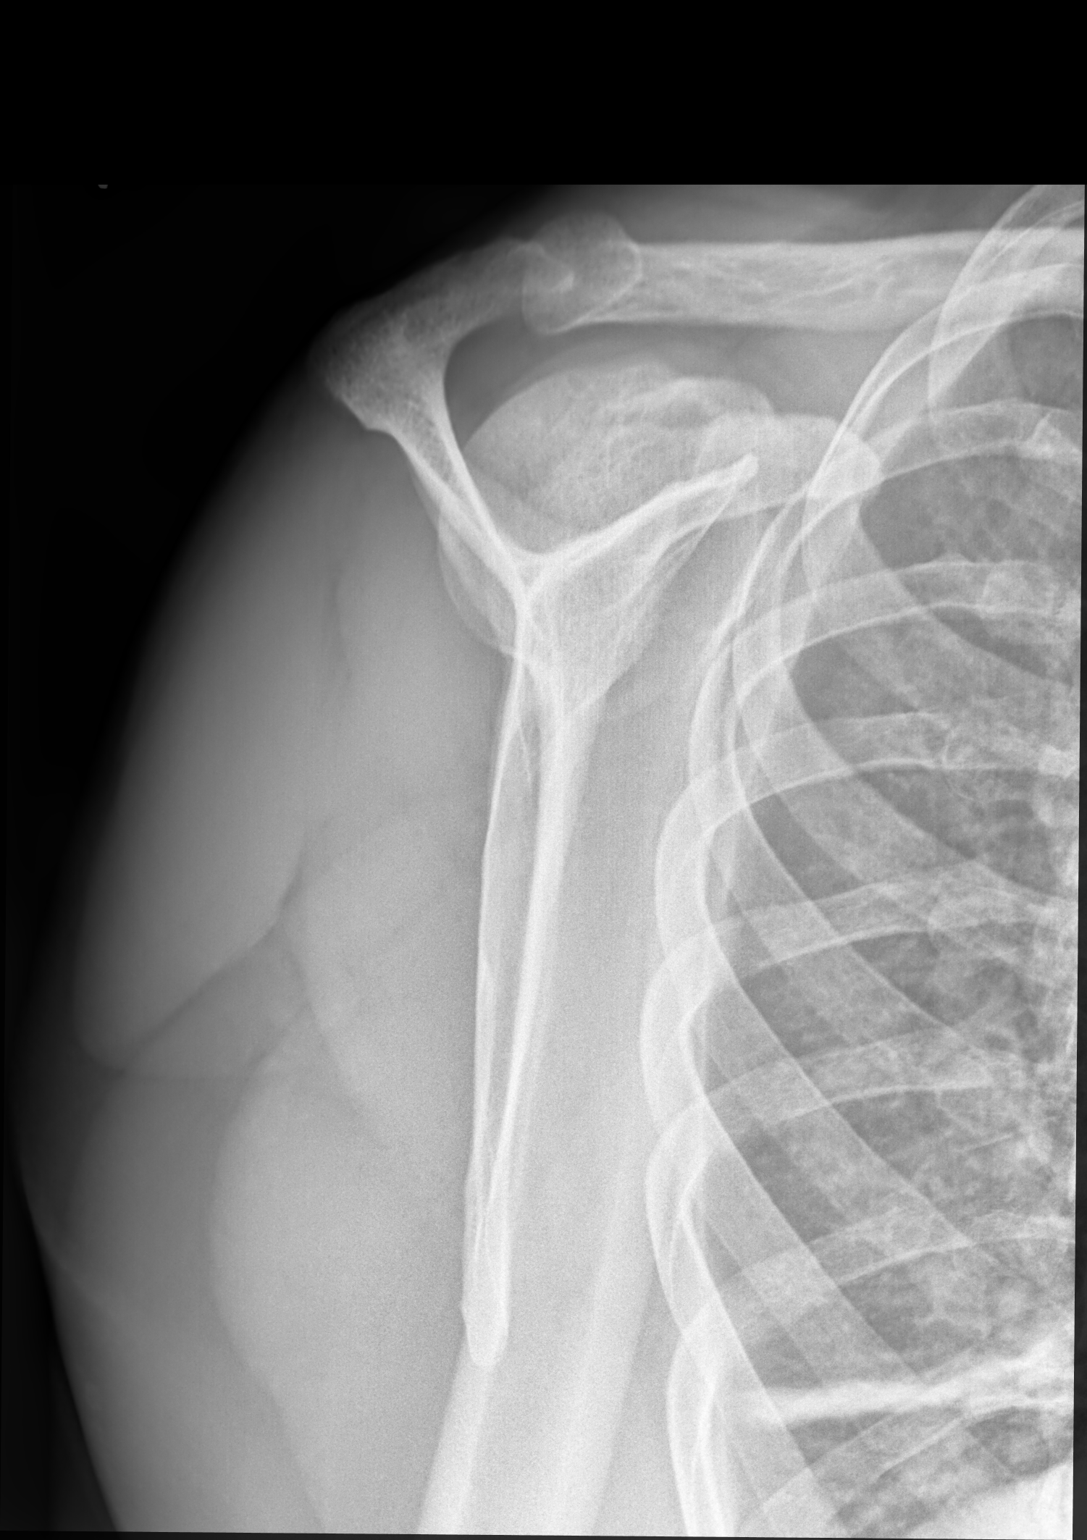

[2 of 2 positions shown; findings below may reference images not displayed]

FINDINGS: Two views of the right shoulder were obtained. The right shoulder is
located without a fracture. Normal alignment at the right AC joint.
Visualized right ribs are intact.
IMPRESSION: No acute abnormality to the right shoulder.

## 2020-03-04 DIAGNOSIS — F332 Major depressive disorder, recurrent severe without psychotic features: Secondary | ICD-10-CM | POA: Diagnosis not present

## 2020-03-07 DIAGNOSIS — F332 Major depressive disorder, recurrent severe without psychotic features: Secondary | ICD-10-CM | POA: Diagnosis not present

## 2020-03-09 DIAGNOSIS — F332 Major depressive disorder, recurrent severe without psychotic features: Secondary | ICD-10-CM | POA: Diagnosis not present

## 2020-03-10 DIAGNOSIS — F332 Major depressive disorder, recurrent severe without psychotic features: Secondary | ICD-10-CM | POA: Diagnosis not present

## 2020-03-15 ENCOUNTER — Other Ambulatory Visit: Payer: Self-pay | Admitting: Family Medicine

## 2020-03-27 ENCOUNTER — Encounter: Payer: Self-pay | Admitting: Family Medicine

## 2020-03-28 ENCOUNTER — Other Ambulatory Visit: Payer: Self-pay

## 2020-03-28 MED ORDER — ATORVASTATIN CALCIUM 10 MG PO TABS: 10.0000 mg | ORAL_TABLET | Freq: Every day | ORAL | 0 refills | Status: DC

## 2020-03-28 MED ORDER — TESTOSTERONE 20.25 MG/ACT (1.62%) TD GEL: TRANSDERMAL | 0 refills | Status: DC

## 2020-03-30 ENCOUNTER — Ambulatory Visit (INDEPENDENT_AMBULATORY_CARE_PROVIDER_SITE_OTHER): Payer: BC Managed Care – PPO | Admitting: Neurology

## 2020-03-30 DIAGNOSIS — G4733 Obstructive sleep apnea (adult) (pediatric): Secondary | ICD-10-CM

## 2020-03-30 DIAGNOSIS — Z9989 Dependence on other enabling machines and devices: Secondary | ICD-10-CM

## 2020-03-30 DIAGNOSIS — Z820 Family history of epilepsy and other diseases of the nervous system: Secondary | ICD-10-CM

## 2020-03-30 DIAGNOSIS — E669 Obesity, unspecified: Secondary | ICD-10-CM

## 2020-03-30 DIAGNOSIS — R7989 Other specified abnormal findings of blood chemistry: Secondary | ICD-10-CM

## 2020-04-01 ENCOUNTER — Telehealth: Payer: Self-pay

## 2020-04-01 ENCOUNTER — Other Ambulatory Visit: Payer: Self-pay | Admitting: Family Medicine

## 2020-04-01 MED ORDER — TESTOSTERONE 20.25 MG/1.25GM (1.62%) TD GEL: TRANSDERMAL | 0 refills | Status: DC

## 2020-04-01 NOTE — Telephone Encounter (Signed)
LAST APPOINTMENT DATE: 10/30/2019  NEXT APPOINTMENT DATE: Visit date not found    LAST REFILL: today   QTY:

## 2020-04-01 NOTE — Telephone Encounter (Signed)
Testosterone 20.25 MG/ACT (1.62%) GEL  Pt.'s wife would like to know if we have any samples of this medication in office, until their mail order prescription arrives.

## 2020-04-01 NOTE — Telephone Encounter (Signed)
Pt wife stated mail pharmacy will send Rx in two weeks  Requesting two weeks supply send to [facility] if is possible.

## 2020-04-06 NOTE — Addendum Note (Signed)
Addended by: Star Age on: 04/06/2020 06:03 PM   Modules accepted: Orders

## 2020-04-06 NOTE — Progress Notes (Signed)
Patient referred by Dr. [name] for re-eval of his OSA, seen by me on 02/11/20, HST on 03/30/20.   Please call and notify the patient that the recent home sleep test confirmed his OSA Dx. He has an older autoPAP machine and should be eligible for a new set of equipment. I would like to Rx a new autoPAP machine. He may need to establish with a new/local DME. The DME representative will educate him on how to use the machine, how to put the mask on, etc. I have placed an order in the chart. Please send referral, talk to patient, send report to referring MD. We will need a FU in sleep clinic for 10 weeks post-PAP set up, please arrange that with me or one of our NPs. Thanks,   Star Age, MD, PhD [facility])

## 2020-04-06 NOTE — Procedures (Signed)
Patient Information     First Name: Ethan Last Name: Declin Osborn: [id number]  Birth Date: 05-Sep-2065 Age: 54 Gender: Male  Referring Provider: Vivi Barrack, MD BMI: 32.7 (W=233 lb, H=5' 11'')  Neck Circ.:  20 '' Epworth:  8/24   Sleep Study Information    Study Date: 03/30/20 S/H/A Version: 001.001.001.001 / 4.1.1528 / 35  History:    54 year old man with a history of allergies, arthritis, anxiety, low T, hyperglycemia and obesity, who was diagnosed with OSA some 15 years ago and placed on CPAP therapy.  Summary & Diagnosis:     OSA Recommendations:     This home sleep test demonstrates severe obstructive sleep apnea - by number of events - with a total AHI of 44.2/hour and O2 nadir of 89%. Ongoing treatment with positive airway pressure is recommended. The patient has been on autoPAP therapy and is eligible for a new machine. I will recommend autoPAP therapy. Please note that untreated obstructive sleep apnea may carry additional perioperative morbidity. Patients with significant obstructive sleep apnea should receive perioperative PAP therapy and the surgeons and particularly the anesthesiologist should be informed of the diagnosis and the severity of the sleep disordered breathing. The patient should be cautioned not to drive, work at heights, or operate dangerous or heavy equipment when tired or sleepy. Review and reiteration of good sleep hygiene measures should be pursued with any patient. Other causes of the patient's symptoms, including circadian rhythm disturbances, an underlying mood disorder, medication effect and/or an underlying medical problem cannot be ruled out based on this test. Clinical correlation is recommended. The patient and his referring provider will be notified of the test results. The patient will be seen in follow up in sleep clinic at [facility].  I certify that I have reviewed the raw data recording prior to the issuance of this report in accordance with the standards of the  American Academy of Sleep Medicine (AASM).  Star Age, MD, PhD [facility]) Diplomat, ABPN (Neurology and Sleep)              Sleep Summary    Oxygen Saturation Statistics     Start Study Time: End Study Time: Total Recording Time:          11:10:18 PM 7:13:29 AM         8 h, 3 min  Total Sleep Time % REM of Sleep Time:  6 h, 53 min  11.5    Mean: 94 Minimum: 89 Maximum: 98  Mean of Desaturations Nadirs (%):   92  Oxygen Desaturation. %: 4-9 10-20 >20 Total  Events Number Total  74 100.0  0 0.0  0 0.0  74 100.0  Oxygen Saturation: <90 <=88 <85 <80 <70  Duration (minutes): Sleep % 0.1 0.0 0.0 0.0 0.0 0.0 0.0 0.0 0.0 0.0     Respiratory Indices      Total Events REM NREM All Night  pRDI:  344  pAHI:  290 ODI:  74  pAHIc:  45  % CSR: 0.0 62.8 53.6 11.8 9.2 51.1 43.0 11.2 6.6 52.5 44.2 11.3 6.9       Pulse Rate Statistics during Sleep (BPM)      Mean: 52 Minimum: N/A Maximum: 93    Indices are calculated using technically valid sleep time of 6 h, 33 min. pRDI/pAHI are calculated using oxi desaturations ? 3%  Body Position Statistics  Position Supine Prone Right Left Non-Supine  Sleep (min) 390.2 0.0  23.0 0.0 23.0  Sleep % 94.4 0.0 5.6 0.0 5.6  pRDI 51.5 N/A 83.4 N/A 83.4  pAHI 43.0 N/A 83.4 N/A 83.4  ODI 10.9 N/A 24.5 N/A 24.5     Snoring Statistics Snoring Level (dB) >40 >50 >60 >70 >80 >Threshold (45)  Sleep (min) 146.1 10.3 0.6 0.0 0.0 31.9  Sleep % 35.4 2.5 0.1 0.0 0.0 7.7    Mean: 41 dB Sleep Stages Chart                                                                   pAHI=44.2                                                                               Mild              Moderate                    Severe                                                 5              15                    30

## 2020-04-07 ENCOUNTER — Telehealth: Payer: Self-pay

## 2020-04-07 NOTE — Telephone Encounter (Signed)
I called pt. No answer, left a message asking pt to call me back.   

## 2020-04-07 NOTE — Telephone Encounter (Signed)
-----   Message from Star Age, MD sent at 04/06/2020  6:03 PM EDT ----- Patient referred by Dr. [name] for re-eval of his OSA, seen by me on 02/11/20, HST on 03/30/20.   Please call and notify the patient that the recent home sleep test confirmed his OSA Dx. He has an older autoPAP machine and should be eligible for a new set of equipment. I would like to Rx a new autoPAP machine. He may need to establish with a new/local DME. The DME representative will educate him on how to use the machine, how to put the mask on, etc. I have placed an order in the chart. Please send referral, talk to patient, send report to referring MD. We will need a FU in sleep clinic for 10 weeks post-PAP set up, please arrange that with me or one of our NPs. Thanks,   Star Age, MD, PhD [facility])

## 2020-04-11 ENCOUNTER — Telehealth: Payer: Self-pay | Admitting: *Deleted

## 2020-04-11 MED ORDER — TESTOSTERONE 20.25 MG/ACT (1.62%) TD GEL: TRANSDERMAL | 0 refills | Status: DC

## 2020-04-11 NOTE — Telephone Encounter (Signed)
Rx sent in.  Algis Greenhouse. [name], MD 04/11/2020 10:11 AM

## 2020-04-11 NOTE — Telephone Encounter (Signed)
Pt returned phone call for sleep study results.

## 2020-04-11 NOTE — Telephone Encounter (Signed)
Pharmacy requesting new Rx Testosterone

## 2020-04-12 NOTE — Telephone Encounter (Addendum)
I called pt. No answer, left a message asking pt to call me back. I also advised if he would prefer he could send a my chart message and we could discuss results there.

## 2020-04-12 NOTE — Telephone Encounter (Signed)
Pt has called again returning your call for sleep study results. Please advise.

## 2020-04-13 NOTE — Telephone Encounter (Signed)
I called pt. I advised pt that Dr. Rexene Alberts reviewed their sleep study results and found that pt's sleep study confirmed the dx of osa. Dr. Rexene Alberts recommends that pt start autopap treatment at home. I reviewed PAP compliance expectations with the pt. Pt is agreeable to starting an auto-PAP. I advised pt that an order will be sent to a DME, [facility], and [facility] will call the pt within about one week after they file with the pt's insurance. [facility] will show the pt how to use the machine, fit for masks, and troubleshoot the auto-PAP if needed. A follow up appt was made for insurance purposes with Dr. Rexene Alberts on 06/21/2020 at 830 am. Pt verbalized understanding to arrive 15 minutes early and bring their auto-PAP. A letter with all of this information in it will be mailed to the pt as a reminder. I verified with the pt that the address we have on file is correct. Pt verbalized understanding of results. Pt had no questions at this time but was encouraged to call back if questions arise. I have sent the order to [facility] and have received confirmation that they have received the order.

## 2020-06-03 ENCOUNTER — Encounter: Payer: Self-pay | Admitting: Physician Assistant

## 2020-06-03 ENCOUNTER — Ambulatory Visit (INDEPENDENT_AMBULATORY_CARE_PROVIDER_SITE_OTHER): Payer: BC Managed Care – PPO | Admitting: Physician Assistant

## 2020-06-03 ENCOUNTER — Other Ambulatory Visit: Payer: Self-pay

## 2020-06-03 VITALS — BP 130/80 | HR 58 | Temp 98.0°F | Ht 71.0 in | Wt 237.0 lb

## 2020-06-03 DIAGNOSIS — T148XXA Other injury of unspecified body region, initial encounter: Secondary | ICD-10-CM

## 2020-06-03 DIAGNOSIS — S60414A Abrasion of right ring finger, initial encounter: Secondary | ICD-10-CM

## 2020-06-03 MED ORDER — DOXYCYCLINE HYCLATE 100 MG PO TABS
100.0000 mg | ORAL_TABLET | Freq: Two times a day (BID) | ORAL | 0 refills | Status: DC
Start: 2020-06-03 — End: 2020-06-30

## 2020-06-03 NOTE — Progress Notes (Signed)
Ethan Osborn is a 54 y.o. male here for a follow up of a pre-existing problem.  I acted as a Education administrator for [employer], PA-C Anselmo Pickler, LPN   History of Present Illness:   Chief Complaint  Patient presents with  . Wound Check    HPI   Wound check Pt c/o wound on ring finger right hand not healing. He sliced his finger of folder chair fabric 2.5 weeks ago. Tip of finger sensitive to touch, irritated, scaly. Has tried antibiotic cream, hydrocortisone no relief.  Denies: fever, chills, purulent discharge from area, malaise  He is UTD on tetanus  Past Medical History:  Diagnosis Date  . Allergy   . Anxiety   . Arthritis    right big toe   . Sleep apnea    wears cpap      Social History   Tobacco Use  . Smoking status: Never Smoker  . Smokeless tobacco: Never Used  Vaping Use  . Vaping Use: Never used  Substance Use Topics  . Alcohol use: Not Currently    Comment: social   . Drug use: Yes    Types: Marijuana    Comment: twice monthly    Past Surgical History:  Procedure Laterality Date  . OPEN REDUCTION SHOULDER DISLOCATION    . WISDOM TOOTH EXTRACTION      Family History  Problem Relation Age of Onset  . Depression Mother   . Hearing loss Mother   . Colon polyps Mother   . Arthritis Father   . Diabetes Father   . Hearing loss Father   . Heart disease Father   . Hyperlipidemia Father   . Kidney disease Father   . Colon polyps Father   . Depression Sister   . Stroke Maternal Grandfather   . Cancer Paternal Grandmother   . Cancer Paternal Grandfather   . Colon cancer Neg Hx   . Esophageal cancer Neg Hx   . Stomach cancer Neg Hx   . Rectal cancer Neg Hx     Allergies  Allergen Reactions  . Penicillins Hives    Shortness of breath    Current Medications:   Current Outpatient Medications:  .  atorvastatin (LIPITOR) 10 MG tablet, Take 1 tablet (10 mg total) by mouth daily at 6 PM., Disp: 90 tablet, Rfl: 0 .  desvenlafaxine (PRISTIQ) 50  MG 24 hr tablet, Take 50 mg by mouth daily., Disp: , Rfl:  .  OLANZapine-FLUoxetine (SYMBYAX) 6-25 MG capsule, Take 1 capsule by mouth at bedtime., Disp: , Rfl:  .  Testosterone 20.25 MG/ACT (1.62%) GEL, place 2 pumps to upper arm and shoulder once daily, Disp: 75 g, Rfl: 0 .  traZODone (DESYREL) 50 MG tablet, TAKE 1 TABLET AT BEDTIME AS NEEDED FOR SLEEP, Disp: 30 tablet, Rfl: 1 .  triamcinolone ointment (KENALOG) 0.5 %, Apply 1 application topically 2 (two) times daily., Disp: 30 g, Rfl: 0 .  doxycycline (VIBRA-TABS) 100 MG tablet, Take 1 tablet (100 mg total) by mouth 2 (two) times daily., Disp: 14 tablet, Rfl: 0   Review of Systems:   ROS  Negative unless otherwise specified per HPI.  Vitals:   Vitals:   06/03/20 1602  BP: 130/80  Pulse: (!) 58  Temp: 98 F (36.7 C)  TempSrc: Temporal  SpO2: 95%  Weight: 237 lb (107.5 kg)  Height: 5\' 11"  (1.803 m)     Body mass index is 33.05 kg/m.  Physical Exam:   Physical Exam Vitals and nursing note  reviewed.  Constitutional:      Appearance: He is well-developed.  HENT:     Head: Normocephalic.  Eyes:     Conjunctiva/sclera: Conjunctivae normal.     Pupils: Pupils are equal, round, and reactive to light.  Cardiovascular:     Comments: Adequate cap refill in fingertips Pulmonary:     Effort: Pulmonary effort is normal.  Musculoskeletal:        General: Normal range of motion.     Cervical back: Normal range of motion.  Skin:    General: Skin is warm and dry.     Comments: Abrasion to tip of R fourth finger, no active discharge, slight abrasion also approaches nail bed  Neurological:     Mental Status: He is alert and oriented to person, place, and time.     Comments: Normal sensation to fingertips  Psychiatric:        Behavior: Behavior normal.        Thought Content: Thought content normal.        Judgment: Judgment normal.        Assessment and Plan:   Hy was seen today for wound check.  Diagnoses and all  orders for this visit:  Abrasion  Other orders -     doxycycline (VIBRA-TABS) 100 MG tablet; Take 1 tablet (100 mg total) by mouth 2 (two) times daily.   No red flags on exam. Suspect possible slight delayed healing due to cortisone use. Stop cortisone and neosporin use. Keep area covered. Start 7 day course of oral doxycycline. Follow-up if lack of improvement or worsening.   CMA or LPN served as scribe during this visit. History, Physical, and Plan performed by medical provider. The above documentation has been reviewed and is accurate and complete.   Inda Coke, PA-C

## 2020-06-03 NOTE — Patient Instructions (Signed)
It was great to see you!  Use the oral antibiotic to help promote healing, especially under the nail where we cannot get to with ointments.  Keep area covered and protected.  Follow-up if worsening or lack of improvement.  Take care,  Inda Coke PA-C

## 2020-06-07 ENCOUNTER — Other Ambulatory Visit: Payer: Self-pay | Admitting: Family Medicine

## 2020-06-15 ENCOUNTER — Telehealth: Payer: Self-pay | Admitting: *Deleted

## 2020-06-15 NOTE — Telephone Encounter (Signed)
PA Testosterone 20.25 MG/ACT(1.62%) gel Approved today CaseId:[id number];Status:Approved;Review Type:Prior Auth;Coverage Start Date:05/16/2020;Coverage End Date:06/15/2021;

## 2020-06-15 NOTE — Telephone Encounter (Signed)
Pharmacy notified.

## 2020-06-17 ENCOUNTER — Telehealth: Payer: BC Managed Care – PPO | Admitting: Family Medicine

## 2020-06-17 ENCOUNTER — Encounter: Payer: Self-pay | Admitting: Family Medicine

## 2020-06-17 NOTE — Progress Notes (Signed)
Erroneous encounter.  Algis Greenhouse. [name], MD 06/17/2020 12:05 PM

## 2020-06-21 ENCOUNTER — Ambulatory Visit: Payer: Self-pay | Admitting: Neurology

## 2020-06-22 ENCOUNTER — Ambulatory Visit: Payer: Self-pay | Admitting: Neurology

## 2020-06-29 ENCOUNTER — Other Ambulatory Visit: Payer: Self-pay

## 2020-06-29 ENCOUNTER — Ambulatory Visit (INDEPENDENT_AMBULATORY_CARE_PROVIDER_SITE_OTHER): Payer: BC Managed Care – PPO

## 2020-06-29 ENCOUNTER — Encounter: Payer: Self-pay | Admitting: Family Medicine

## 2020-06-29 DIAGNOSIS — Z23 Encounter for immunization: Secondary | ICD-10-CM

## 2020-06-29 NOTE — Patient Instructions (Signed)
Please continue using your CPAP regularly. While your insurance requires that you use CPAP at least 4 hours each night on 70% of the nights, I recommend, that you not skip any nights and use it throughout the night if you can. Getting used to CPAP and staying with the treatment long term does take time and patience and discipline. Untreated obstructive sleep apnea when it is moderate to severe can have an adverse impact on cardiovascular health and raise her risk for heart disease, arrhythmias, hypertension, congestive heart failure, stroke and diabetes. Untreated obstructive sleep apnea causes sleep disruption, nonrestorative sleep, and sleep deprivation. This can have an impact on your day to day functioning and cause daytime sleepiness and impairment of cognitive function, memory loss, mood disturbance, and problems focussing. Using CPAP regularly can improve these symptoms.   Follow up in 1 year   Sleep Apnea Sleep apnea affects breathing during sleep. It causes breathing to stop for a short time or to become shallow. It can also increase the risk of:  Heart attack.  Stroke.  Being very overweight (obese).  Diabetes.  Heart failure.  Irregular heartbeat. The goal of treatment is to help you breathe normally again. What are the causes? There are three kinds of sleep apnea:  Obstructive sleep apnea. This is caused by a blocked or collapsed airway.  Central sleep apnea. This happens when the brain does not send the right signals to the muscles that control breathing.  Mixed sleep apnea. This is a combination of obstructive and central sleep apnea. The most common cause of this condition is a collapsed or blocked airway. This can happen if:  Your throat muscles are too relaxed.  Your tongue and tonsils are too large.  You are overweight.  Your airway is too small. What increases the risk?  Being overweight.  Smoking.  Having a small airway.  Being older.  Being  male.  Drinking alcohol.  Taking medicines to calm yourself (sedatives or tranquilizers).  Having family members with the condition. What are the signs or symptoms?  Trouble staying asleep.  Being sleepy or tired during the day.  Getting angry a lot.  Loud snoring.  Headaches in the morning.  Not being able to focus your mind (concentrate).  Forgetting things.  Less interest in sex.  Mood swings.  Personality changes.  Feelings of sadness (depression).  Waking up a lot during the night to pee (urinate).  Dry mouth.  Sore throat. How is this diagnosed?  Your medical history.  A physical exam.  A test that is done when you are sleeping (sleep study). The test is most often done in a sleep lab but may also be done at home. How is this treated?   Sleeping on your side.  Using a medicine to get rid of mucus in your nose (decongestant).  Avoiding the use of alcohol, medicines to help you relax, or certain pain medicines (narcotics).  Losing weight, if needed.  Changing your diet.  Not smoking.  Using a machine to open your airway while you sleep, such as: ? An oral appliance. This is a mouthpiece that shifts your lower jaw forward. ? A CPAP device. This device blows air through a mask when you breathe out (exhale). ? An EPAP device. This has valves that you put in each nostril. ? A BPAP device. This device blows air through a mask when you breathe in (inhale) and breathe out.  Having surgery if other treatments do not work. It is   important to get treatment for sleep apnea. Without treatment, it can lead to:  High blood pressure.  Coronary artery disease.  In men, not being able to have an erection (impotence).  Reduced thinking ability. Follow these instructions at home: Lifestyle  Make changes that your doctor recommends.  Eat a healthy diet.  Lose weight if needed.  Avoid alcohol, medicines to help you relax, and some pain  medicines.  Do not use any products that contain nicotine or tobacco, such as cigarettes, e-cigarettes, and chewing tobacco. If you need help quitting, ask your doctor. General instructions  Take over-the-counter and prescription medicines only as told by your doctor.  If you were given a machine to use while you sleep, use it only as told by your doctor.  If you are having surgery, make sure to tell your doctor you have sleep apnea. You may need to bring your device with you.  Keep all follow-up visits as told by your doctor. This is important. Contact a doctor if:  The machine that you were given to use during sleep bothers you or does not seem to be working.  You do not get better.  You get worse. Get help right away if:  Your chest hurts.  You have trouble breathing in enough air.  You have an uncomfortable feeling in your back, arms, or stomach.  You have trouble talking.  One side of your body feels weak.  A part of your face is hanging down. These symptoms may be an emergency. Do not wait to see if the symptoms will go away. Get medical help right away. Call your local emergency services (911 in the [place] Do not drive yourself to the hospital. Summary  This condition affects breathing during sleep.  The most common cause is a collapsed or blocked airway.  The goal of treatment is to help you breathe normally while you sleep. This information is not intended to replace advice given to you by your health care provider. Make sure you discuss any questions you have with your health care provider. Document Revised: 05/16/2018 Document Reviewed: 03/25/2018 Elsevier Patient Education  2020 Elsevier Inc.  

## 2020-06-29 NOTE — Progress Notes (Addendum)
PATIENT: Ethan Osborn DOB: Feb 12, 1966  REASON FOR VISIT: follow up HISTORY FROM: patient  Chief Complaint  Patient presents with  . Follow-up    RM 1  . Sleep Apnea    pt said everything is fine with his cpap     HISTORY OF PRESENT ILLNESS: Today 06/30/20 Ethan Osborn is a 54 y.o. male here today for follow up for OSA on CPAP. HST on 03/30/2020 showed "severe obstructive sleep apnea by number of events - with a total AHI of 44.2/hour and O2 nadir of 89%".  He is doing very well with CPAP therapy.  He has adjusted well to his new machine and pressure settings.  He denies any concerns today.  He has been followed closely by primary care for depression.   Compliance report dated 05/31/2020 through 06/29/2020 reveals that he used CPAP 30 of the past 30 days for compliance of 100%.  He used CPAP greater than 4 hours all 30 days.  Average usage was 10 hours and 13 minutes.  Residual AHI was 0.8 on 7 to 13 cm of water and an EPR of 2.  There was no significant leak noted.   HISTORY: (copied from Dr Guadelupe Sabin previous note)  Dear Dr. [name],   I saw your patient, Ethan Osborn, upon your kind request, in my sleep clinic today for initial consultation of his sleep disorder, in particular, evaluation of his prior diagnosis of obstructive sleep apnea. The patient is unaccompanied today. As you know, Mr. Mccanless is a 54 year old right handed gentleman with an underlying medical history of allergies, arthritis, anxiety, low T, hyperglycemia and obesity, who was diagnosed with OSA some 15 years ago and placed on CPAP therapy. Prior sleep study results are not available for my review today.  [place] in [place].  He moved to [place] about 5 years ago and has had surplus of supplies, this particular machine is about 73+ years old and he uses it every night.  He is indeed fully compliant with treatment as I was able to review his compliance data.  In the past month from  01/12/2020 through 02/10/2020 he has used his machine every night with percent use days greater than 4 hours at 100%, indicating superb compliance with an average usage at 8 hours and 8 minutes, residual AHI at goal at 3.2/h, 95th percentile of pressure at 10.7 cm with a range of 5-20 which is the default setting on his AutoPap machine.  Leak is low with a 95th percentile at 3.7 L/min, he uses a large ResMed Swift fracture nasal pillows interface.  He has changed his filters on a regular basis, he does not have a local DME company.  I reviewed your office note from 10/30/19. His Epworth sleepiness score is 8 out of 24 today, fatigue severity score is 33 out of 63.  He recalls being told he had severe sleep apnea.  Prior to treatment he had significant nocturia, no recurrent morning headaches, both parents have sleep apnea.  He has had improvement in his symptoms when he started treatment.  He has nocturia about once or twice per average night and denies any recurrent morning headaches currently.  He goes to bed generally between 930 and 10 and rise time is around 630.  He works as a Conservation officer, historic buildings.  He lives with his wife and younger daughter, age 45, he has an older daughter age 23 in college at [employer].  He is a non-smoker  and drinks alcohol occasionally, on the weekend, caffeine in the form of hot tea, 1 cup in the morning.     REVIEW OF SYSTEMS: Out of a complete 14 system review of symptoms, the patient complains only of the following symptoms, depression and all other reviewed systems are negative.  ESS: 4 FSS: 18  ALLERGIES: Allergies  Allergen Reactions  . Penicillins Hives    Shortness of breath    HOME MEDICATIONS: Outpatient Medications Prior to Visit  Medication Sig Dispense Refill  . atorvastatin (LIPITOR) 10 MG tablet TAKE 1 TABLET DAILY AT 6 P.M. 90 tablet 3  . desvenlafaxine (PRISTIQ) 50 MG 24 hr tablet Take 50 mg by mouth daily.    . Testosterone 20.25 MG/ACT (1.62%) GEL place  2 pumps to upper arm and shoulder once daily 75 g 0  . traZODone (DESYREL) 50 MG tablet TAKE 1 TABLET AT BEDTIME AS NEEDED FOR SLEEP 30 tablet 1  . OLANZapine-FLUoxetine (SYMBYAX) 6-25 MG capsule Take 1 capsule by mouth at bedtime.    . triamcinolone ointment (KENALOG) 0.5 % Apply 1 application topically 2 (two) times daily. 30 g 0  . doxycycline (VIBRA-TABS) 100 MG tablet Take 1 tablet (100 mg total) by mouth 2 (two) times daily. 14 tablet 0   No facility-administered medications prior to visit.    PAST MEDICAL HISTORY: Past Medical History:  Diagnosis Date  . Allergy   . Anxiety   . Arthritis    right big toe   . Sleep apnea    wears cpap     PAST SURGICAL HISTORY: Past Surgical History:  Procedure Laterality Date  . OPEN REDUCTION SHOULDER DISLOCATION    . WISDOM TOOTH EXTRACTION      FAMILY HISTORY: Family History  Problem Relation Age of Onset  . Depression Mother   . Hearing loss Mother   . Colon polyps Mother   . Arthritis Father   . Diabetes Father   . Hearing loss Father   . Heart disease Father   . Hyperlipidemia Father   . Kidney disease Father   . Colon polyps Father   . Depression Sister   . Stroke Maternal Grandfather   . Cancer Paternal Grandmother   . Cancer Paternal Grandfather   . Colon cancer Neg Hx   . Esophageal cancer Neg Hx   . Stomach cancer Neg Hx   . Rectal cancer Neg Hx     SOCIAL HISTORY: Social History   Socioeconomic History  . Marital status: Married    Spouse name: Not on file  . Number of children: Not on file  . Years of education: Not on file  . Highest education level: Not on file  Occupational History  . Occupation: Armed forces technical officer   Tobacco Use  . Smoking status: Never Smoker  . Smokeless tobacco: Never Used  Vaping Use  . Vaping Use: Never used  Substance and Sexual Activity  . Alcohol use: Not Currently    Comment: social   . Drug use: Yes    Types: Marijuana    Comment: twice monthly  . Sexual  activity: Yes  Other Topics Concern  . Not on file  Social History Narrative  . Not on file   Social Determinants of Health   Financial Resource Strain:   . Difficulty of Paying Living Expenses: Not on file  Food Insecurity:   . Worried About Charity fundraiser in the Last Year: Not on file  . Ran Out of Food in the  Last Year: Not on file  Transportation Needs:   . Lack of Transportation (Medical): Not on file  . Lack of Transportation (Non-Medical): Not on file  Physical Activity:   . Days of Exercise per Week: Not on file  . Minutes of Exercise per Session: Not on file  Stress:   . Feeling of Stress : Not on file  Social Connections:   . Frequency of Communication with Friends and Family: Not on file  . Frequency of Social Gatherings with Friends and Family: Not on file  . Attends Religious Services: Not on file  . Active Member of Clubs or Organizations: Not on file  . Attends Archivist Meetings: Not on file  . Marital Status: Not on file  Intimate Partner Violence:   . Fear of Current or Ex-Partner: Not on file  . Emotionally Abused: Not on file  . Physically Abused: Not on file  . Sexually Abused: Not on file     PHYSICAL EXAM  Vitals:   06/30/20 1139  BP: (!) 147/87  Pulse: 61  Weight: 237 lb (107.5 kg)  Height: 5\' 11"  (1.803 m)   Body mass index is 33.05 kg/m.  Generalized: Well developed, in no acute distress  Cardiology: normal rate and rhythm, no murmur noted Respiratory: clear to auscultation bilaterally  Neurological examination  Mentation: Alert oriented to time, place, history taking. Follows all commands speech and language fluent Cranial nerve II-XII: Pupils were equal round reactive to light. Extraocular movements were full, visual field were full  Motor: The motor testing reveals 5 over 5 strength of all 4 extremities. Good symmetric motor tone is noted throughout.  Gait and station: Gait is normal.    DIAGNOSTIC DATA (LABS,  IMAGING, TESTING) - I reviewed patient records, labs, notes, testing and imaging myself where available.  No flowsheet data found.   Lab Results  Component Value Date   WBC 6.7 10/15/2019   HGB 16.8 10/15/2019   HCT 48.3 10/15/2019   MCV 89.6 10/15/2019   PLT 193 10/15/2019      Component Value Date/Time   NA 139 10/15/2019 0622   NA 140 06/08/2016 0000   K 4.0 10/15/2019 0622   CL 105 10/15/2019 0622   CO2 24 10/15/2019 0622   GLUCOSE 191 (H) 10/15/2019 0622   BUN 20 10/15/2019 0622   BUN 17 06/08/2016 0000   CREATININE 0.91 10/15/2019 0622   CALCIUM 9.3 10/15/2019 0622   PROT 6.5 10/15/2019 0622   ALBUMIN 4.0 10/15/2019 0622   AST 32 10/15/2019 0622   ALT 55 (H) 10/15/2019 0622   ALKPHOS 52 10/15/2019 0622   BILITOT 1.1 10/15/2019 0622   GFRNONAA >60 10/15/2019 0622   GFRAA >60 10/15/2019 0622   Lab Results  Component Value Date   CHOL 247 (H) 10/15/2019   HDL 34 (L) 10/15/2019   LDLCALC UNABLE TO CALCULATE IF TRIGLYCERIDE OVER 400 mg/dL 10/15/2019   LDLDIRECT 114.5 (H) 10/15/2019   TRIG 448 (H) 10/15/2019   CHOLHDL 7.3 10/15/2019   Lab Results  Component Value Date   HGBA1C 8.1 (H) 10/30/2019   Lab Results  Component Value Date   VITAMINB12 486 08/01/2018   Lab Results  Component Value Date   TSH 3.883 10/15/2019     ASSESSMENT AND PLAN 54 y.o. year old male  has a past medical history of Allergy, Anxiety, Arthritis, and Sleep apnea. here with     ICD-10-CM   1. OSA on CPAP  G47.33  Z99.89      Jj Enyeart is doing well on CPAP therapy. Compliance report reveals excellent compliance. He was encouraged to continue using CPAP nightly and for greater than 4 hours each night. We will update supply orders as indicated. Risks of untreated sleep apnea review and education materials provided. Healthy lifestyle habits encouraged.  He will continue close follow-up with primary care for management of depression.  He will follow up in 1 year, sooner if  needed. He verbalizes understanding and agreement with this plan.    No orders of the defined types were placed in this encounter.    No orders of the defined types were placed in this encounter.     I spent 15 minutes with the patient. 50% of this time was spent counseling and educating patient on plan of care and medications.    Debbora Presto, FNP-C 06/30/2020, 11:42 AM [facility] [address], [place], [place] [address] [phone]   I reviewed the above note and documentation by the Nurse Practitioner and agree with the history, exam, assessment and plan as outlined above. I was available for consultation. Star Age, MD, PhD  [facility])

## 2020-06-30 ENCOUNTER — Encounter: Payer: Self-pay | Admitting: Family Medicine

## 2020-06-30 ENCOUNTER — Ambulatory Visit (INDEPENDENT_AMBULATORY_CARE_PROVIDER_SITE_OTHER): Payer: BC Managed Care – PPO | Admitting: Family Medicine

## 2020-06-30 ENCOUNTER — Ambulatory Visit: Payer: Self-pay | Admitting: Neurology

## 2020-06-30 VITALS — BP 147/87 | HR 61 | Ht 71.0 in | Wt 237.0 lb

## 2020-06-30 DIAGNOSIS — Z9989 Dependence on other enabling machines and devices: Secondary | ICD-10-CM | POA: Diagnosis not present

## 2020-06-30 DIAGNOSIS — G4733 Obstructive sleep apnea (adult) (pediatric): Secondary | ICD-10-CM | POA: Diagnosis not present

## 2020-07-04 ENCOUNTER — Ambulatory Visit (INDEPENDENT_AMBULATORY_CARE_PROVIDER_SITE_OTHER): Payer: BC Managed Care – PPO | Admitting: Family Medicine

## 2020-07-04 ENCOUNTER — Other Ambulatory Visit: Payer: Self-pay

## 2020-07-04 ENCOUNTER — Encounter: Payer: Self-pay | Admitting: Family Medicine

## 2020-07-04 DIAGNOSIS — F332 Major depressive disorder, recurrent severe without psychotic features: Secondary | ICD-10-CM

## 2020-07-04 DIAGNOSIS — L918 Other hypertrophic disorders of the skin: Secondary | ICD-10-CM

## 2020-07-04 NOTE — Assessment & Plan Note (Signed)
Stable.  Doing well on Abilify 10 mg daily and Pristiq at 100 mg daily per psychiatry.

## 2020-07-04 NOTE — Progress Notes (Signed)
   Ethan Osborn is a 54 y.o. male who presents today for an office visit.  Assessment/Plan:  New/Acute Problems: Inflamed Skin Tags Removal performed today.  He tolerated well.  See below procedure note.  Chronic Problems Addressed Today: MDD (major depressive disorder), recurrent episode, severe (HCC) Stable.  Doing well on Abilify 10 mg daily and Pristiq at 100 mg daily per psychiatry.     Subjective:  HPI:  Patient has several inflamed skin tags he would like to have removed today.  To located on left neck, 1 in right axilla, and 1 in left groin.  They have been there for several years.         Objective:  Physical Exam: BP (!) 144/75   Pulse 64   Temp 97.6 F (36.4 C) (Temporal)   Ht 5\' 11"  (1.803 m)   Wt 240 lb (108.9 kg)   SpO2 96%   BMI 33.47 kg/m   Gen: No acute distress, resting comfortably Skin: Typical appearing skin tags: 2 on left neck, 1 in right axilla, and left groin. Psych: Normal affect and thought content  Skin Tag Removal Procedure Note Diagnosis: inflamed skin tags Location: left neck, right axilla, left groin Informed Consent: Discussed risks (permanent scarring, infection, pain, bleeding, bruising, redness, and recurrence of the lesion) and benefits of the procedure, as well as the alternatives. He is aware that skin tags are benign lesions, and their removal is often not considered medically necessary. Informed consent was obtained. Preparation: The area was prepared in a standard fashion. Anesthesia: Lidocaine 1% with epinephrine Procedure Details: Iris scissors were used to perform sharp removal. Aluminum chloride was applied for hemostasis. Ointment and bandage were applied where needed. The patient tolerated the procedure well. Total number of lesions treated: 4 Plan: The patient was instructed on post-op care. Recommend OTC analgesia as needed for pain.       Algis Greenhouse. [name], MD 07/04/2020 2:52 PM

## 2020-07-04 NOTE — Patient Instructions (Signed)
It was very nice to see you today!  We removed your skin tags today.  Please keep the area clean and dry for the next 24 hours.  Let me know if you have any signs of infection.  You can use over-the-counter meds as needed for pain.  Take care, Dr [name]  Please try these tips to maintain a healthy lifestyle:   Eat at least 3 REAL meals and 1-2 snacks per day.  Aim for no more than 5 hours between eating.  If you eat breakfast, please do so within one hour of getting up.    Each meal should contain half fruits/vegetables, one quarter protein, and one quarter carbs (no bigger than a computer mouse)   Cut down on sweet beverages. This includes juice, soda, and sweet tea.     Drink at least 1 glass of water with each meal and aim for at least 8 glasses per day   Exercise at least 150 minutes every week.   Skin Tag Removal Wound Care instructions  . Sometimes bandages are not necessary.  If a bandage is used, leave the original bandage on for 24 hours if possible.  If the bandage becomes soaked or soiled before that time, it is OK to remove it and examine the wound.  A small amount of post-operative bleeding is normal.  If excessive bleeding occurs, remove the bandage, place gauze over the site and apply continuous pressure (no peeking) over the area for 20-30 minutes.  If this does not stop the bleeding, try again for 40 minutes.  If this does not work, please call our clinic as soon as possible (even if after hours).    . Once a day, cleanse the wound with soap and water.  If a thick crust develops you may use a Q-tip dipped into dilute hydrogen peroxide (mix 1:1 with water) to dissolve it.  Hydrogen peroxide can slow the healing process, so use it only as needed.  After washing, apply Vaseline jelly or Polysporin ointment.  For best healing, the wound should be covered with a layer of ointment at all times.  This may mean re-applying the ointment several times a day.  For open wounds,  continue until it has healed.    . If you have any swelling, keep the area elevated.  . Some redness, tenderness and white or yellow material in the wound is normal healing.  If the area becomes very sore and red, or develops a thick yellow-green material (pus), it may be infected; please notify us.    . Wound healing continues for up to one year following surgery.  It is not unusual to experience pain in the scar from time to time during the interval.  If the pain becomes severe or the scar thickens, you should notify the office.  A slight amount of redness in a scar is expected for the first six months.  After six months, the redness subsides and the scar will soften and fade.  The color difference becomes less noticeable with time.  If there are any problems, return for a post-op surgery check at your earliest convenience.  . It is important to wear sunscreen daily with an SPF of 30 or higher over the treated sites and to wear sun-protective clothing.  . Please call our office for any questions or concerns.

## 2020-07-28 ENCOUNTER — Other Ambulatory Visit: Payer: Self-pay | Admitting: *Deleted

## 2020-07-28 MED ORDER — TESTOSTERONE 20.25 MG/ACT (1.62%) TD GEL
TRANSDERMAL | 0 refills | Status: DC
Start: 2020-07-28 — End: 2020-08-03

## 2020-07-28 NOTE — Telephone Encounter (Signed)
LAST APPOINTMENT DATE: 07/04/2020   NEXT APPOINTMENT DATE: Visit date not found   Rx Testosterone  LAST REFILL: 08/30/20201

## 2020-08-03 ENCOUNTER — Other Ambulatory Visit: Payer: Self-pay | Admitting: *Deleted

## 2020-08-03 NOTE — Telephone Encounter (Signed)
Patient requesting refill Rx Testosterone send [facility]  LAST APPOINTMENT DATE: 07/04/2020   NEXT APPOINTMENT DATE: Visit date not found    LAST REFILL: 07/28/2020

## 2020-08-04 MED ORDER — TESTOSTERONE 20.25 MG/ACT (1.62%) TD GEL: TRANSDERMAL | 0 refills | Status: DC

## 2020-10-29 ENCOUNTER — Other Ambulatory Visit: Payer: Self-pay | Admitting: Psychiatry

## 2020-11-02 ENCOUNTER — Other Ambulatory Visit: Payer: Self-pay | Admitting: Family Medicine

## 2020-11-03 MED ORDER — TESTOSTERONE 20.25 MG/ACT (1.62%) TD GEL: TRANSDERMAL | 0 refills | Status: DC

## 2020-11-17 DIAGNOSIS — Z0271 Encounter for disability determination: Secondary | ICD-10-CM

## 2020-12-08 ENCOUNTER — Encounter: Payer: BC Managed Care – PPO | Admitting: Family Medicine

## 2020-12-29 ENCOUNTER — Other Ambulatory Visit: Payer: Self-pay | Admitting: Family Medicine

## 2020-12-29 ENCOUNTER — Encounter: Payer: BC Managed Care – PPO | Admitting: Family Medicine

## 2020-12-29 MED ORDER — TESTOSTERONE 20.25 MG/ACT (1.62%) TD GEL
TRANSDERMAL | 0 refills | Status: DC
Start: 2020-12-29 — End: 2021-05-27

## 2021-02-08 ENCOUNTER — Encounter: Payer: BC Managed Care – PPO | Admitting: Family Medicine

## 2021-04-18 ENCOUNTER — Other Ambulatory Visit (HOSPITAL_COMMUNITY): Payer: Self-pay | Admitting: Neurology

## 2021-04-18 DIAGNOSIS — G3184 Mild cognitive impairment, so stated: Secondary | ICD-10-CM

## 2021-04-28 ENCOUNTER — Other Ambulatory Visit: Payer: Self-pay

## 2021-04-28 ENCOUNTER — Ambulatory Visit (HOSPITAL_COMMUNITY)
Admission: RE | Admit: 2021-04-28 | Discharge: 2021-04-28 | Disposition: A | Discharge status: Home / Self Care | Payer: BC Managed Care – PPO | Source: Ambulatory Visit | Attending: Neurology | Admitting: Neurology

## 2021-04-28 DIAGNOSIS — G3184 Mild cognitive impairment, so stated: Secondary | ICD-10-CM | POA: Diagnosis present

## 2021-04-28 MED ORDER — GADOBUTROL 1 MMOL/ML IV SOLN
10.0000 mL | Freq: Once | INTRAVENOUS | Status: AC | PRN
  Administered 2021-04-28: 10 mL via INTRAVENOUS

## 2021-05-08 ENCOUNTER — Encounter: Payer: Self-pay | Admitting: Physician Assistant

## 2021-05-08 ENCOUNTER — Ambulatory Visit (INDEPENDENT_AMBULATORY_CARE_PROVIDER_SITE_OTHER): Payer: BC Managed Care – PPO | Admitting: Physician Assistant

## 2021-05-08 ENCOUNTER — Other Ambulatory Visit: Payer: Self-pay

## 2021-05-08 VITALS — BP 128/80 | HR 58 | Temp 98.3°F | Ht 71.0 in | Wt 230.2 lb

## 2021-05-08 DIAGNOSIS — Z23 Encounter for immunization: Secondary | ICD-10-CM | POA: Diagnosis not present

## 2021-05-08 DIAGNOSIS — R739 Hyperglycemia, unspecified: Secondary | ICD-10-CM

## 2021-05-08 DIAGNOSIS — E119 Type 2 diabetes mellitus without complications: Secondary | ICD-10-CM | POA: Diagnosis not present

## 2021-05-08 LAB — POCT GLYCOSYLATED HEMOGLOBIN (HGB A1C): Hemoglobin A1C: 9.2 % — AB (ref 4.0–5.6)

## 2021-05-08 MED ORDER — OZEMPIC (0.25 OR 0.5 MG/DOSE) 2 MG/1.5ML ~~LOC~~ SOPN: 0.5000 mg | PEN_INJECTOR | SUBCUTANEOUS | 0 refills | Status: DC

## 2021-05-08 MED ORDER — OZEMPIC (0.25 OR 0.5 MG/DOSE) 2 MG/1.5ML ~~LOC~~ SOPN: 0.2500 mg | PEN_INJECTOR | SUBCUTANEOUS | 1 refills | Status: DC

## 2021-05-08 NOTE — Progress Notes (Signed)
Ethan Osborn is a 55 y.o. male is here to discuss: Blood sugar  I acted as a Education administrator for [employer], PA-C Anselmo Pickler, LPN   History of Present Illness:   Chief Complaint  Patient presents with   Elevated blood sugar level     HPI  Elevated blood sugar Pt here to discuss high glucose level done on 8/31 was 231. He has been checking sugars at home and averaging 260-270. Denies signs of hypoglycemia or hyperglycemia. Denies neuropathy, increased thirst or urination. Has family hx of DM. Denies prior personal hx of DM.   Health Maintenance Due  Topic Date Due   Hepatitis C Screening  Never done   Zoster Vaccines- Shingrix (1 of 2) Never done   COVID-19 Vaccine (3 - Pfizer risk series) 12/30/2019   INFLUENZA VACCINE  03/13/2021    Past Medical History:  Diagnosis Date   Allergy    Anxiety    Arthritis    right big toe    Sleep apnea    wears cpap      Social History   Tobacco Use   Smoking status: Never   Smokeless tobacco: Never  Vaping Use   Vaping Use: Never used  Substance Use Topics   Alcohol use: Not Currently    Comment: social    Drug use: Yes    Types: Marijuana    Comment: twice monthly    Past Surgical History:  Procedure Laterality Date   OPEN REDUCTION SHOULDER DISLOCATION     WISDOM TOOTH EXTRACTION      Family History  Problem Relation Age of Onset   Depression Mother    Hearing loss Mother    Colon polyps Mother    Arthritis Father    Diabetes Father    Hearing loss Father    Heart disease Father    Hyperlipidemia Father    Kidney disease Father    Colon polyps Father    Depression Sister    Stroke Maternal Grandfather    Cancer Paternal Grandmother    Cancer Paternal Grandfather    Colon cancer Neg Hx    Esophageal cancer Neg Hx    Stomach cancer Neg Hx    Rectal cancer Neg Hx     PMHx, SurgHx, SocialHx, FamHx, Medications, and Allergies were reviewed in the Visit Navigator and updated as appropriate.    Patient Active Problem List   Diagnosis Date Noted   Dyslipidemia 10/30/2019   Hyperglycemia 10/30/2019   MDD (major depressive disorder), recurrent episode, severe ([place]) 10/14/2019   Sleep apnea    Arthritis    Allergy    Seborrheic keratoses 09/18/2018   Low testosterone 08/04/2018   Low vitamin D level 08/04/2018   Actinic keratosis 07/01/2018   Shoulder dislocation, right, initial encounter 03/10/2018   Rash 07/18/2017   GAD (generalized anxiety disorder) 01/08/2017    Social History   Tobacco Use   Smoking status: Never   Smokeless tobacco: Never  Vaping Use   Vaping Use: Never used  Substance Use Topics   Alcohol use: Not Currently    Comment: social    Drug use: Yes    Types: Marijuana    Comment: twice monthly    Current Medications and Allergies:    Current Outpatient Medications:    atorvastatin (LIPITOR) 10 MG tablet, TAKE 1 TABLET DAILY AT 6 P.M., Disp: 90 tablet, Rfl: 3   desvenlafaxine (PRISTIQ) 50 MG 24 hr tablet, Take 100 mg by mouth daily., Disp: ,  Rfl:    Semaglutide,0.25 or 0.5MG /DOS, (OZEMPIC, 0.25 OR 0.5 MG/DOSE,) 2 MG/1.5ML SOPN, Inject 0.25 mg into the skin once a week., Disp: 1.5 mL, Rfl: 1   Semaglutide,0.25 or 0.5MG /DOS, (OZEMPIC, 0.25 OR 0.5 MG/DOSE,) 2 MG/1.5ML SOPN, Inject 0.5 mg into the skin once a week., Disp: 1.5 mL, Rfl: 0   Testosterone 20.25 MG/ACT (1.62%) GEL, place 2 pumps to upper arm and shoulder once daily, Disp: 75 g, Rfl: 0   traZODone (DESYREL) 50 MG tablet, TAKE 1 TABLET AT BEDTIME AS NEEDED FOR SLEEP, Disp: 30 tablet, Rfl: 1   ARIPiprazole (ABILIFY) 10 MG tablet, Take 10 mg by mouth daily., Disp: , Rfl:    buPROPion (WELLBUTRIN XL) 150 MG 24 hr tablet, Take 150 mg by mouth daily., Disp: , Rfl:    Allergies  Allergen Reactions   Penicillins Hives    Shortness of breath    Review of Systems   ROS Negative unless otherwise specified per HPI.  Vitals:   Vitals:   05/08/21 1359  BP: 128/80  Pulse: (!) 58   Temp: 98.3 F (36.8 C)  TempSrc: Temporal  SpO2: 95%  Weight: 230 lb 4 oz (104.4 kg)  Height: 5\' 11"  (1.803 m)     Body mass index is 32.11 kg/m.   Physical Exam:    Physical Exam Vitals and nursing note reviewed.  Constitutional:      General: He is not in acute distress.    Appearance: He is well-developed. He is not ill-appearing or toxic-appearing.  Cardiovascular:     Rate and Rhythm: Normal rate and regular rhythm.     Pulses: Normal pulses.     Heart sounds: Normal heart sounds, S1 normal and S2 normal.  Pulmonary:     Effort: Pulmonary effort is normal.     Breath sounds: Normal breath sounds.  Skin:    General: Skin is warm and dry.  Neurological:     Mental Status: He is alert.     GCS: GCS eye subscore is 4. GCS verbal subscore is 5. GCS motor subscore is 6.  Psychiatric:        Speech: Speech normal.        Behavior: Behavior normal. Behavior is cooperative.   Results for orders placed or performed in visit on 05/08/21  POCT glycosylated hemoglobin (Hb A1C)  Result Value Ref Range   Hemoglobin A1C 9.2 (A) 4.0 - 5.6 %      Assessment and Plan:   Type 2 diabetes mellitus without complication, without long-term current use of insulin ([place]) New dx for patient Reviewed nutrition guidelines and h/o provided He cannot provide urine sample today -- will defer urine protein screen for PCP Foot exam completed today Normotensive in office and on statin UTD with eye exam and is aware of need for yearly DM retinopathy exam Start ozempic 0.25 mg weekly and maintain until sees PCP in 3 months   CMA or LPN served as scribe during this visit. History, Physical, and Plan performed by medical provider. The above documentation has been reviewed and is accurate and complete.  Time spent with patient today was 25 minutes which consisted of chart review, discussing diagnosis, work up, treatment answering questions and documentation.   Inda Coke, PA-C [place],  [place] 05/08/2021  Follow-up: No follow-ups on file.

## 2021-05-08 NOTE — Patient Instructions (Addendum)
It was great to see you!  Start ozempic 0.25 mg weekly -- the sample will last 6 weeks, then you can pick up the prescription Continue this until you see Dr. [name]  If this medication is unaffordable or if you have any other concerns, please let us know  Follow-up with Dr. [name] any time after December 26th to follow-up on diabetes, sooner if you have concerns.  Take care,  Ethan Coke PA-C

## 2021-05-09 ENCOUNTER — Telehealth: Payer: Self-pay | Admitting: *Deleted

## 2021-05-09 LAB — COMPREHENSIVE METABOLIC PANEL
ALT: 79 U/L — ABNORMAL HIGH (ref 0–53)
AST: 64 U/L — ABNORMAL HIGH (ref 0–37)
Albumin: 4.4 g/dL (ref 3.5–5.2)
Alkaline Phosphatase: 53 U/L (ref 39–117)
BUN: 18 mg/dL (ref 6–23)
CO2: 25 mEq/L (ref 19–32)
Calcium: 9.2 mg/dL (ref 8.4–10.5)
Chloride: 101 mEq/L (ref 96–112)
Creatinine, Ser: 1.07 mg/dL (ref 0.40–1.50)
GFR: 79.18 mL/min (ref >60.00)
Glucose, Bld: 229 mg/dL — ABNORMAL HIGH (ref 70–99)
Potassium: 3.9 mEq/L (ref 3.5–5.1)
Sodium: 136 mEq/L (ref 135–145)
Total Bilirubin: 0.6 mg/dL (ref 0.2–1.2)
Total Protein: 6.4 g/dL (ref 6.0–8.3)

## 2021-05-09 NOTE — Telephone Encounter (Signed)
Called [facility] and spoke to Catarina told her PA for Ozempic has been approved. Claiborne Billings verbalized understanding and Rx went through.

## 2021-05-09 NOTE — Telephone Encounter (Signed)
Received fax from pharmacy PA needed for Ozempic 0.25 mg. PA started through Covermymeds.  Key: [id number] Awaiting response.

## 2021-05-09 NOTE — Telephone Encounter (Signed)
This request has been approved using information available on the patient's profile. [id number];Review Type:Prior Auth;Coverage Start Date:04/09/2021;Coverage End Date:05/09/2022;

## 2021-05-22 ENCOUNTER — Telehealth: Payer: Self-pay

## 2021-05-22 NOTE — Telephone Encounter (Signed)
Ethan Osborn called requesting to speak to [facility]. She stated that is regarding the hospital bed. Ethan Osborn stated that the company also needs the office visit notes faxed over. Ethan Osborn would like a call back. Please Advise.

## 2021-05-23 NOTE — Telephone Encounter (Signed)
Notes faxed.

## 2021-05-23 NOTE — Telephone Encounter (Signed)
Patient daughter aware. °

## 2021-05-26 ENCOUNTER — Ambulatory Visit (INDEPENDENT_AMBULATORY_CARE_PROVIDER_SITE_OTHER): Payer: BC Managed Care – PPO | Admitting: Family Medicine

## 2021-05-26 ENCOUNTER — Encounter: Payer: Self-pay | Admitting: Family Medicine

## 2021-05-26 ENCOUNTER — Other Ambulatory Visit: Payer: Self-pay

## 2021-05-26 VITALS — BP 123/76 | HR 58 | Temp 97.9°F | Ht 71.0 in | Wt 221.0 lb

## 2021-05-26 DIAGNOSIS — F332 Major depressive disorder, recurrent severe without psychotic features: Secondary | ICD-10-CM

## 2021-05-26 DIAGNOSIS — R739 Hyperglycemia, unspecified: Secondary | ICD-10-CM | POA: Diagnosis not present

## 2021-05-26 DIAGNOSIS — Z0001 Encounter for general adult medical examination with abnormal findings: Secondary | ICD-10-CM | POA: Diagnosis not present

## 2021-05-26 DIAGNOSIS — E785 Hyperlipidemia, unspecified: Secondary | ICD-10-CM

## 2021-05-26 DIAGNOSIS — R7989 Other specified abnormal findings of blood chemistry: Secondary | ICD-10-CM

## 2021-05-26 DIAGNOSIS — E669 Obesity, unspecified: Secondary | ICD-10-CM

## 2021-05-26 DIAGNOSIS — Z6830 Body mass index (BMI) 30.0-30.9, adult: Secondary | ICD-10-CM

## 2021-05-26 NOTE — Patient Instructions (Signed)
It was very nice to see you today!  Please increase your Ozempic to 0.5 mg weekly after you are on the fourth week.  We will check blood work when you come back in for your follow-up visit.  Continue working on diet and exercise.  Please come back in 1 year for your next physical.  Take care, Dr [name]  PLEASE NOTE:  If you had any lab tests please let us know if you have not heard back within a few days. You may see your results on mychart before we have a chance to review them but we will give you a call once they are reviewed by [place]. If we ordered any referrals today, please let us know if you have not heard from their office within the next week.   Please try these tips to maintain a healthy lifestyle:  Eat at least 3 REAL meals and 1-2 snacks per day.  Aim for no more than 5 hours between eating.  If you eat breakfast, please do so within one hour of getting up.   Each meal should contain half fruits/vegetables, one quarter protein, and one quarter carbs (no bigger than a computer mouse)  Cut down on sweet beverages. This includes juice, soda, and sweet tea.   Drink at least 1 glass of water with each meal and aim for at least 8 glasses per day  Exercise at least 150 minutes every week.    Preventive Care 53-65 Years Old, Male Preventive care refers to lifestyle choices and visits with your health care provider that can promote health and wellness. This includes: A yearly physical exam. This is also called an annual wellness visit. Regular dental and eye exams. Immunizations. Screening for certain conditions. Healthy lifestyle choices, such as: Eating a healthy diet. Getting regular exercise. Not using drugs or products that contain nicotine and tobacco. Limiting alcohol use. What can I expect for my preventive care visit? Physical exam Your health care provider will check your: Height and weight. These may be used to calculate your BMI (body mass index). BMI is a  measurement that tells if you are at a healthy weight. Heart rate and blood pressure. Body temperature. Skin for abnormal spots. Counseling Your health care provider may ask you questions about your: Past medical problems. Family's medical history. Alcohol, tobacco, and drug use. Emotional well-being. Home life and relationship well-being. Sexual activity. Diet, exercise, and sleep habits. Work and work Statistician. Access to firearms. What immunizations do I need? Vaccines are usually given at various ages, according to a schedule. Your health care provider will recommend vaccines for you based on your age, medical history, and lifestyle or other factors, such as travel or where you work. What tests do I need? Blood tests Lipid and cholesterol levels. These may be checked every 5 years, or more often if you are over 29 years old. Hepatitis C test. Hepatitis B test. Screening Lung cancer screening. You may have this screening every year starting at age 56 if you have a 30-pack-year history of smoking and currently smoke or have quit within the past 15 years. Prostate cancer screening. Recommendations will vary depending on your family history and other risks. Genital exam to check for testicular cancer or hernias. Colorectal cancer screening. All adults should have this screening starting at age 68 and continuing until age 93. Your health care provider may recommend screening at age 32 if you are at increased risk. You will have tests every 1-10 years, depending on  your results and the type of screening test. Diabetes screening. This is done by checking your blood sugar (glucose) after you have not eaten for a while (fasting). You may have this done every 1-3 years. STD (sexually transmitted disease) testing, if you are at risk. Follow these instructions at home: Eating and drinking  Eat a diet that includes fresh fruits and vegetables, whole grains, lean protein, and low-fat  dairy products. Take vitamin and mineral supplements as recommended by your health care provider. Do not drink alcohol if your health care provider tells you not to drink. If you drink alcohol: Limit how much you have to 0-2 drinks a day. Be aware of how much alcohol is in your drink. In the [place] one drink equals one 12 oz bottle of beer (355 mL), one 5 oz glass of wine (148 mL), or one 1 oz glass of hard liquor (44 mL). Lifestyle Take daily care of your teeth and gums. Brush your teeth every morning and night with fluoride toothpaste. Floss one time each day. Stay active. Exercise for at least 30 minutes 5 or more days each week. Do not use any products that contain nicotine or tobacco, such as cigarettes, e-cigarettes, and chewing tobacco. If you need help quitting, ask your health care provider. Do not use drugs. If you are sexually active, practice safe sex. Use a condom or other form of protection to prevent STIs (sexually transmitted infections). If told by your health care provider, take low-dose aspirin daily starting at age 48. Find healthy ways to cope with stress, such as: Meditation, yoga, or listening to music. Journaling. Talking to a trusted person. Spending time with friends and family. Safety Always wear your seat belt while driving or riding in a vehicle. Do not drive: If you have been drinking alcohol. Do not ride with someone who has been drinking. When you are tired or distracted. While texting. Wear a helmet and other protective equipment during sports activities. If you have firearms in your house, make sure you follow all gun safety procedures. What's next? Go to your health care provider once a year for an annual wellness visit. Ask your health care provider how often you should have your eyes and teeth checked. Stay up to date on all vaccines. This information is not intended to replace advice given to you by your health care provider. Make sure you discuss  any questions you have with your health care provider. Document Revised: 10/07/2020 Document Reviewed: 07/24/2018 Elsevier Patient Education  2022 [employer].

## 2021-05-26 NOTE — Assessment & Plan Note (Signed)
Check vitamin D with next blood draw. 

## 2021-05-26 NOTE — Assessment & Plan Note (Signed)
Continue testosterone replacement.  We will check testosterone when he comes back in for his follow-up visit.

## 2021-05-26 NOTE — Progress Notes (Signed)
Chief Complaint:  Ethan Osborn is a 55 y.o. male who presents today for his annual comprehensive physical exam.    Assessment/Plan:  Chronic Problems Addressed Today: Hyperglycemia On Ozempic 0.25 mg weekly.  He will increase to 0.5 mg weekly in a few weeks.  He will follow-up in about 3 months to recheck A1c.  Dyslipidemia On Lipitor 10 mg daily.  We will recheck lipids when he comes back in for his next office visit.  MDD (major depressive disorder), recurrent episode, severe ([place]) Doing much better with current regimen of Wellbutrin 150 mg daily, Pristiq 100 mg daily, and Abilify 10 mg daily.  Low vitamin D level Check vitamin D with next blood draw.   Low testosterone Continue testosterone replacement.  We will check testosterone when he comes back in for his follow-up visit.   Body mass index is 30.82 kg/m. / Obese  BMI Metric Follow Up - 05/26/21 1201       BMI Metric Follow Up-Please document annually   BMI Metric Follow Up Education provided              Preventative Healthcare: UTD on vaccines.  He will come back to check labs.  Patient Counseling(The following topics were reviewed and/or handout was given):  -Nutrition: Stressed importance of moderation in sodium/caffeine intake, saturated fat and cholesterol, caloric balance, sufficient intake of fresh fruits, vegetables, and fiber.  -Stressed the importance of regular exercise.   -Substance Abuse: Discussed cessation/primary prevention of tobacco, alcohol, or other drug use; driving or other dangerous activities under the influence; availability of treatment for abuse.   -Injury prevention: Discussed safety belts, safety helmets, smoke detector, smoking near bedding or upholstery.   -Sexuality: Discussed sexually transmitted diseases, partner selection, use of condoms, avoidance of unintended pregnancy and contraceptive alternatives.   -Dental health: Discussed importance of regular tooth brushing,  flossing, and dental visits.  -Health maintenance and immunizations reviewed. Please refer to Health maintenance section.  Return to care in 1 year for next preventative visit.     Subjective:  HPI:  He has no acute complaints today.   Currently he is still taking the .25 mg dosage of ozempic. He states that he has been in a good mood with the given medications, in relation to his depression. He also states that ketamine had worked well for him.  Lifestyle Diet: Is working on improving diet in relation to his diabetes diagnosis Exercise: Regular  Depression screen [facility] 2/9 07/04/2020  Decreased Interest 1  Down, Depressed, Hopeless 1  PHQ - 2 Score 2  Altered sleeping 2  Tired, decreased energy 2  Change in appetite 2  Feeling bad or failure about yourself  1  Trouble concentrating 2  Moving slowly or fidgety/restless 0  Suicidal thoughts 1  PHQ-9 Score 12  Difficult doing work/chores -  Some recent data might be hidden    Health Maintenance Due  Topic Date Due   URINE MICROALBUMIN  Never done   Hepatitis C Screening  Never done   Zoster Vaccines- Shingrix (1 of 2) Never done   COVID-19 Vaccine (3 - Pfizer risk series) 12/30/2019     ROS: Per HPI, otherwise a complete review of systems was negative.   PMH:  The following were reviewed and entered/updated in epic: Past Medical History:  Diagnosis Date   Allergy    Anxiety    Arthritis    right big toe    Sleep apnea    wears cpap  Patient Active Problem List   Diagnosis Date Noted   Dyslipidemia 10/30/2019   Hyperglycemia 10/30/2019   MDD (major depressive disorder), recurrent episode, severe ([place]) 10/14/2019   Sleep apnea    Arthritis    Allergy    Seborrheic keratoses 09/18/2018   Low testosterone 08/04/2018   Low vitamin D level 08/04/2018   Actinic keratosis 07/01/2018   Shoulder dislocation, right, initial encounter 03/10/2018   Rash 07/18/2017   GAD (generalized anxiety disorder) 01/08/2017    Past Surgical History:  Procedure Laterality Date   OPEN REDUCTION SHOULDER DISLOCATION     WISDOM TOOTH EXTRACTION      Family History  Problem Relation Age of Onset   Depression Mother    Hearing loss Mother    Colon polyps Mother    Arthritis Father    Diabetes Father    Hearing loss Father    Heart disease Father    Hyperlipidemia Father    Kidney disease Father    Colon polyps Father    Depression Sister    Stroke Maternal Grandfather    Cancer Paternal Grandmother    Cancer Paternal Grandfather    Colon cancer Neg Hx    Esophageal cancer Neg Hx    Stomach cancer Neg Hx    Rectal cancer Neg Hx     Medications- reviewed and updated Current Outpatient Medications  Medication Sig Dispense Refill   ARIPiprazole (ABILIFY) 10 MG tablet Take 10 mg by mouth daily.     atorvastatin (LIPITOR) 10 MG tablet TAKE 1 TABLET DAILY AT 6 P.M. 90 tablet 3   buPROPion (WELLBUTRIN XL) 150 MG 24 hr tablet Take 150 mg by mouth daily.     desvenlafaxine (PRISTIQ) 50 MG 24 hr tablet Take 100 mg by mouth daily.     Semaglutide,0.25 or 0.5MG /DOS, (OZEMPIC, 0.25 OR 0.5 MG/DOSE,) 2 MG/1.5ML SOPN Inject 0.5 mg into the skin once a week. 1.5 mL 0   Testosterone 20.25 MG/ACT (1.62%) GEL place 2 pumps to upper arm and shoulder once daily 75 g 0   traZODone (DESYREL) 50 MG tablet TAKE 1 TABLET AT BEDTIME AS NEEDED FOR SLEEP 30 tablet 1   No current facility-administered medications for this visit.    Allergies-reviewed and updated Allergies  Allergen Reactions   Penicillins Hives    Shortness of breath    Social History   Socioeconomic History   Marital status: Married    Spouse name: Not on file   Number of children: Not on file   Years of education: Not on file   Highest education level: Not on file  Occupational History   Occupation: Armed forces technical officer   Tobacco Use   Smoking status: Never   Smokeless tobacco: Never  Vaping Use   Vaping Use: Never used  Substance and  Sexual Activity   Alcohol use: Not Currently    Comment: social    Drug use: Yes    Types: Marijuana    Comment: twice monthly   Sexual activity: Yes  Other Topics Concern   Not on file  Social History Narrative   Not on file   Social Determinants of Health   Financial Resource Strain: Not on file  Food Insecurity: Not on file  Transportation Needs: Not on file  Physical Activity: Not on file  Stress: Not on file  Social Connections: Not on file        Objective:  Physical Exam: BP 123/76   Pulse (!) 58   Temp 97.9 F (  36.6 C) (Temporal)   Ht 5\' 11"  (1.803 m)   Wt 221 lb (100.2 kg)   SpO2 96%   BMI 30.82 kg/m   Body mass index is 30.82 kg/m. Wt Readings from Last 3 Encounters:  05/26/21 221 lb (100.2 kg)  05/08/21 230 lb 4 oz (104.4 kg)  07/04/20 240 lb (108.9 kg)   Gen: NAD, resting comfortably HEENT: TMs normal bilaterally. OP clear. No thyromegaly noted.  CV: RRR with no murmurs appreciated Pulm: NWOB, CTAB with no crackles, wheezes, or rhonchi GI: Normal bowel sounds present. Soft, Nontender, Nondistended. MSK: no edema, cyanosis, or clubbing noted Skin: warm, dry Neuro: CN2-12 grossly intact. Strength 5/5 in upper and lower extremities. Reflexes symmetric and intact bilaterally.  Psych: Normal affect and thought content     I,Jordan Kelly,acting as a scribe for Dimas Chyle, MD.,have documented all relevant documentation on the behalf of Dimas Chyle, MD,as directed by  Dimas Chyle, MD while in the presence of Dimas Chyle, MD.  I, Dimas Chyle, MD, have reviewed all documentation for this visit. The documentation on 05/26/21 for the exam, diagnosis, procedures, and orders are all accurate and complete.  Algis Greenhouse. [name], MD 05/26/2021 12:01 PM

## 2021-05-26 NOTE — Telephone Encounter (Signed)
Daughter is calling in stating that they need he date updated on the forms, requesting a call back today.

## 2021-05-26 NOTE — Assessment & Plan Note (Signed)
On Ozempic 0.25 mg weekly.  He will increase to 0.5 mg weekly in a few weeks.  He will follow-up in about 3 months to recheck A1c.

## 2021-05-26 NOTE — Assessment & Plan Note (Signed)
On Lipitor 10 mg daily.  We will recheck lipids when he comes back in for his next office visit.

## 2021-05-26 NOTE — Assessment & Plan Note (Signed)
Doing much better with current regimen of Wellbutrin 150 mg daily, Pristiq 100 mg daily, and Abilify 10 mg daily.

## 2021-05-27 ENCOUNTER — Other Ambulatory Visit: Payer: Self-pay | Admitting: Family Medicine

## 2021-05-29 ENCOUNTER — Other Ambulatory Visit: Payer: Self-pay | Admitting: Family Medicine

## 2021-05-29 MED ORDER — TESTOSTERONE 20.25 MG/ACT (1.62%) TD GEL: TRANSDERMAL | 0 refills | Status: DC

## 2021-05-29 NOTE — Telephone Encounter (Signed)
See below

## 2021-06-01 NOTE — Telephone Encounter (Signed)
Done and documented on patient chart  Ethan Osborn DOB 11/17/1938

## 2021-06-14 ENCOUNTER — Encounter: Payer: Self-pay | Admitting: Family Medicine

## 2021-06-19 NOTE — Telephone Encounter (Signed)
See note

## 2021-06-22 ENCOUNTER — Other Ambulatory Visit: Payer: Self-pay

## 2021-06-22 DIAGNOSIS — F332 Major depressive disorder, recurrent severe without psychotic features: Secondary | ICD-10-CM

## 2021-07-03 ENCOUNTER — Ambulatory Visit: Payer: BC Managed Care – PPO | Admitting: Family Medicine

## 2021-07-12 ENCOUNTER — Other Ambulatory Visit: Payer: Self-pay | Admitting: Physician Assistant

## 2021-07-20 ENCOUNTER — Other Ambulatory Visit: Payer: Self-pay | Admitting: Family Medicine

## 2021-07-21 MED ORDER — TESTOSTERONE 20.25 MG/ACT (1.62%) TD GEL: TRANSDERMAL | 5 refills | Status: DC

## 2021-07-28 ENCOUNTER — Ambulatory Visit (INDEPENDENT_AMBULATORY_CARE_PROVIDER_SITE_OTHER): Payer: BC Managed Care – PPO | Admitting: Psychologist

## 2021-07-28 ENCOUNTER — Other Ambulatory Visit: Payer: Self-pay

## 2021-07-28 DIAGNOSIS — F332 Major depressive disorder, recurrent severe without psychotic features: Secondary | ICD-10-CM

## 2021-07-28 NOTE — Progress Notes (Signed)
[facility] [facility] Counselor Initial Adult Exam  Name: Ethan Osborn Date: 07/28/2021 MRN: [id number] DOB: 06/24/1966 PCP: Vivi Barrack, MD  Time spent: 9:00 am to 9:34 am; Total time: 34 minutes  This session was held via in person. The patient consented to in-person therapy and was in the clinician's office. Limits of confidentiality were discussed with the patient.   Guardian/Payee:  NA    Paperwork requested: No   Reason for Visit /Presenting Problem: The patient is seeking out counseling due to experiencing recurrent major depressive symptoms severe without psychotic features. The patient currently is seeing a therapist, however indicated that needs are not currently being met by that therapist as patient wants to get to the cause of the depression.   Mental Status Exam: Appearance:   Well Groomed     Behavior:  Appropriate  Motor:  Normal  Speech/Language:   Normal Rate  Affect:  Appropriate  Mood:  normal  Thought process:  normal  Thought content:    WNL  Sensory/Perceptual disturbances:    WNL  Orientation:  oriented to person, place, time/date, and situation  Attention:  Good  Concentration:  Good  Memory:  WNL  Fund of knowledge:   Good  Insight:    Fair  Judgment:   Fair  Impulse Control:  Good    Reported Symptoms:  The patient endorsed experiencing the following: rumination of negative thoughts, feeling sad, social isolation, avoiding pleasurable activities, fatigue, lack of motivation, and thoughts of hopelessness. The patient currently denied suicidal and homicidal ideation. The patient has a history of being hospitalized due to experiencing suicidal ideation. The patient previously has done ECT and was using Ketamine in assistance in treating depressive symptoms.   Risk Assessment: Danger to Self:  No Self-injurious Behavior: No Danger to Others: No Duty to Warn:no Physical Aggression / Violence:No  Access to Firearms a concern: No  Gang  Involvement:No  Patient / guardian was educated about steps to take if suicide or homicide risk level increases between visits: n/a While future psychiatric events cannot be accurately predicted, the patient does not currently require acute inpatient psychiatric care and does not currently meet [facility] involuntary commitment criteria.  Substance Abuse History: Current substance abuse: No     Past Psychiatric History:   Previous psychological history is significant for depression Outpatient Providers:Patient indicated that he has been seeing a therapist for the past year whose name is Lelon Frohlich. The patient indicated that his current clinician has a Oceanographer and is working towards a Pensions consultant. Per the patient, he also has done five rounds of ECT and used Ketamine prescribed by a psychiatrist. Currently, he is not using Ketamine or doing ECT.  History of Psych Hospitalization: Yes  Patient was hospitalized 1.5 years ago due to experiencing active suicidal ideation. He denied current suicidal and homicidal ideation.  Psychological Testing:  NA    Abuse History:  Victim of: No.,  na    Report needed: No. Victim of Neglect:No. Perpetrator of  NA   Witness / Exposure to Domestic Violence:  NA   Protective Services Involvement: No  Witness to [employer] Violence:  No   Family History:  Family History  Problem Relation Age of Onset   Depression Mother    Hearing loss Mother    Colon polyps Mother    Arthritis Father    Diabetes Father    Hearing loss Father    Heart disease Father    Hyperlipidemia Father    Kidney  disease Father    Colon polyps Father    Depression Sister    Stroke Maternal Grandfather    Cancer Paternal Grandmother    Cancer Paternal Grandfather    Colon cancer Neg Hx    Esophageal cancer Neg Hx    Stomach cancer Neg Hx    Rectal cancer Neg Hx     Living situation: the patient lives with their family. Patient lived with wife, and two daughters.   Sexual  Orientation: Straight  Relationship Status: married  Name of spouse / other:Wife's name is Almyra Free and they have been married for 26 years.  If a parent, number of children / ages:Patient has two daughters who are 29 and 35 years old.   Support Systems: spouse friends parents  Museum/gallery curator Stress:  No   Income/Employment/Disability: Employment  Armed forces logistics/support/administrative officer: No   Educational History: Education: Scientist, product/process development: Patient described himself as Engineer, manufacturing.   Any cultural differences that may affect / interfere with treatment:  NA  Recreation/Hobbies: Hiking  Stressors: Other: Dealing with depressive symptoms     Strengths: Supportive Relationships, Family, Friends, Conservator, museum/gallery, and Able to Communicate Effectively  Barriers:  NA   Legal History: Pending legal issue / charges: The patient has no significant history of legal issues. History of legal issue / charges:  NA  Medical History/Surgical History: reviewed Past Medical History:  Diagnosis Date   Allergy    Anxiety    Arthritis    right big toe    Sleep apnea    wears cpap     Past Surgical History:  Procedure Laterality Date   OPEN REDUCTION SHOULDER DISLOCATION     WISDOM TOOTH EXTRACTION      Medications: Current Outpatient Medications  Medication Sig Dispense Refill   ARIPiprazole (ABILIFY) 10 MG tablet Take 10 mg by mouth daily.     atorvastatin (LIPITOR) 10 MG tablet TAKE 1 TABLET DAILY AT 6 P.M. 90 tablet 3   buPROPion (WELLBUTRIN XL) 150 MG 24 hr tablet Take 150 mg by mouth daily.     desvenlafaxine (PRISTIQ) 50 MG 24 hr tablet Take 100 mg by mouth daily.     OZEMPIC, 0.25 OR 0.5 MG/DOSE, 2 MG/1.5ML SOPN INJECT 0.25MG INTO THE SKIN ONE TIME PER WEEK 1.5 mL 2   Testosterone 20.25 MG/ACT (1.62%) GEL place 2 pumps to upper arm and shoulder once daily 75 g 5   traZODone (DESYREL) 50 MG tablet TAKE 1 TABLET AT BEDTIME AS NEEDED FOR SLEEP 30 tablet 1   No current  facility-administered medications for this visit.    Allergies  Allergen Reactions   Penicillins Hives    Shortness of breath    Diagnoses:  F33.2 major depressive affective disorder, recurrent, severe  Plan of Care: The patient is a 55 year old Caucasian male who was referred due to experiencing recurrent major depressive disorder symptoms and feeling as though current therapist is not meeting his needs. The patient lives at home with his wife and daughters. The patient meets criteria for a diagnosis of F33.2 major depressive affective disorder, recurrent, severe based off of the following: rumination of negative thoughts, feeling sad, social isolation, avoiding pleasurable activities, fatigue, lack of motivation, and thoughts of hopelessness. The patient currently denied suicidal and homicidal ideation. The patient has a history of being hospitalized due to experiencing suicidal ideation. The patient previously has done ECT and was using Ketamine in assistance in treating depressive symptoms. The patient is currently in therapy with  a different therapist and has been in therapy for over a year.   The patient stated that he wanted to get to the "cause" of his depressive symptoms. The patient processed with the therapist regarding fit for therapy as well as next steps for therapy. The patient was agreeable to the plan discussed where the patient will follow up with current therapist to see if current therapist can change approach to counseling to meet needs.   This psychologist makes the recommendation that the patient follow up with his current therapist to see if current therapist is able to meet specific needs. If the current therapist is unable to meet those needs than this clinician would consider working with the patient depending on goals for therapy as well as considering how consistent the patient needs therapy. Currently, the patient sees therapist bi-weekly. If goals and consistency for  therapy can be agreed than this psychologist will meet with the patient.  The patient was agreeable to following up with his current therapist. If necessary the patient can contact this clinician to schedule an appointment. No treatment plan will be made at this time as patient is currently in therapy somewhere else and unclear if patient will follow up.    Conception Chancy, PsyD

## 2021-08-09 ENCOUNTER — Other Ambulatory Visit: Payer: Self-pay

## 2021-08-09 ENCOUNTER — Ambulatory Visit (INDEPENDENT_AMBULATORY_CARE_PROVIDER_SITE_OTHER): Payer: BC Managed Care – PPO | Admitting: Family Medicine

## 2021-08-09 VITALS — BP 125/82 | HR 64 | Temp 97.0°F | Ht 71.0 in | Wt 220.0 lb

## 2021-08-09 DIAGNOSIS — Z23 Encounter for immunization: Secondary | ICD-10-CM

## 2021-08-09 DIAGNOSIS — R739 Hyperglycemia, unspecified: Secondary | ICD-10-CM

## 2021-08-09 DIAGNOSIS — F332 Major depressive disorder, recurrent severe without psychotic features: Secondary | ICD-10-CM | POA: Diagnosis not present

## 2021-08-09 LAB — POCT GLYCOSYLATED HEMOGLOBIN (HGB A1C): Hemoglobin A1C: 6.6 % — AB (ref 4.0–5.6)

## 2021-08-09 MED ORDER — OZEMPIC (0.25 OR 0.5 MG/DOSE) 2 MG/1.5ML ~~LOC~~ SOPN: 0.5000 mg | PEN_INJECTOR | SUBCUTANEOUS | 2 refills | Status: DC

## 2021-08-09 MED ORDER — TRAZODONE HCL 50 MG PO TABS: 50.0000 mg | ORAL_TABLET | Freq: Every evening | ORAL | 1 refills | Status: DC | PRN

## 2021-08-09 NOTE — Patient Instructions (Signed)
It was very nice to see you today!  Your A1c looks much better today.  Please increase your Ozempic to 0.5 mg weekly.  We will give your pneumonia vaccine.  We will see you back in 6 months.  Please come back to see [place] sooner if needed.  Take care, Dr [name]  PLEASE NOTE:  If you had any lab tests please let us know if you have not heard back within a few days. You may see your results on mychart before we have a chance to review them but we will give you a call once they are reviewed by [place]. If we ordered any referrals today, please let us know if you have not heard from their office within the next week.   Please try these tips to maintain a healthy lifestyle:  Eat at least 3 REAL meals and 1-2 snacks per day.  Aim for no more than 5 hours between eating.  If you eat breakfast, please do so within one hour of getting up.   Each meal should contain half fruits/vegetables, one quarter protein, and one quarter carbs (no bigger than a computer mouse)  Cut down on sweet beverages. This includes juice, soda, and sweet tea.   Drink at least 1 glass of water with each meal and aim for at least 8 glasses per day  Exercise at least 150 minutes every week.

## 2021-08-09 NOTE — Assessment & Plan Note (Signed)
Managed by psychiatry.  He is on Wellbutrin 150 mg daily, Pristiq 100 mg daily, and Abilify 10 mg daily.  We will refill trazodone.  This helps with the sleep.  Symptoms are stable.

## 2021-08-09 NOTE — Assessment & Plan Note (Signed)
A1c much better at 6.6.  Will increase dose of Ozempic to 0.5 mg weekly.  Follow-up in 6 months to recheck A1c.  Continue lifestyle modifications.

## 2021-08-09 NOTE — Addendum Note (Signed)
Addended by: Cranston Neighbor on: 08/09/2021 08:34 AM   Modules accepted: Orders

## 2021-08-09 NOTE — Progress Notes (Signed)
° °  Ethan Osborn is a 55 y.o. male who presents today for an office visit.  Assessment/Plan:  Chronic Problems Addressed Today: Hyperglycemia A1c much better at 6.6.  Will increase dose of Ozempic to 0.5 mg weekly.  Follow-up in 6 months to recheck A1c.  Continue lifestyle modifications.  MDD (major depressive disorder), recurrent episode, severe ([place]) Managed by psychiatry.  He is on Wellbutrin 150 mg daily, Pristiq 100 mg daily, and Abilify 10 mg daily.  We will refill trazodone.  This helps with the sleep.  Symptoms are stable.  Prevnar 20 given today.     Subjective:  HPI:  See A/p for status of chronic conditions.        Objective:  Physical Exam: BP 125/82    Pulse 64    Temp (!) 97 F (36.1 C)    Ht 5\' 11"  (1.803 m)    Wt 220 lb (99.8 kg)    SpO2 96%    BMI 30.68 kg/m   Gen: No acute distress, resting comfortably CV: Regular rate and rhythm with no murmurs appreciated Pulm: Normal work of breathing, clear to auscultation bilaterally with no crackles, wheezes, or rhonchi Neuro: Grossly normal, moves all extremities Psych: Normal affect and thought content      Tabathia Knoche M. [name], MD 08/09/2021 8:25 AM

## 2021-09-02 ENCOUNTER — Other Ambulatory Visit: Payer: Self-pay | Admitting: Family Medicine

## 2021-09-21 ENCOUNTER — Encounter: Payer: Self-pay | Admitting: Family Medicine

## 2021-09-21 ENCOUNTER — Ambulatory Visit: Payer: BC Managed Care – PPO | Admitting: Family Medicine

## 2021-09-21 VITALS — BP 127/77 | HR 64 | Ht 71.0 in | Wt 214.5 lb

## 2021-09-21 DIAGNOSIS — Z9989 Dependence on other enabling machines and devices: Secondary | ICD-10-CM | POA: Diagnosis not present

## 2021-09-21 DIAGNOSIS — G4733 Obstructive sleep apnea (adult) (pediatric): Secondary | ICD-10-CM | POA: Diagnosis not present

## 2021-09-21 NOTE — Progress Notes (Signed)
PATIENT: Ethan Osborn DOB: 02/12/1966  REASON FOR VISIT: follow up HISTORY FROM: patient  Chief Complaint  Patient presents with   Obstructive Sleep Apnea    Rm 10, alone. Here for yearly CPAP f/u. Set up date:05/25/20. Pt reports doing well. No issues or concerns      HISTORY OF PRESENT ILLNESS:  09/21/21 ALL:  Ethan Osborn is a 56 y.o. male here today for follow up for OSA on CPAP.  His compliance report below demonstrates excellent compliance wih both days used and for >4 hours. His apnea is well managed with an AHI of 0.9. He reports that he is doing great and has no complaints at this time.    HISTORY: (copied from previous note) ALL 06/30/20 Ethan Osborn is a 56 y.o. male here today for follow up for OSA on CPAP. HST on 03/30/2020 showed "severe obstructive sleep apnea by number of events - with a total AHI of 44.2/hour and O2 nadir of 89%".  He is doing very well with CPAP therapy.  He has adjusted well to his new machine and pressure settings.  He denies any concerns today.  He has been followed closely by primary care for depression.   Compliance report dated 05/31/2020 through 06/29/2020 reveals that he used CPAP 30 of the past 30 days for compliance of 100%.  He used CPAP greater than 4 hours all 30 days.  Average usage was 10 hours and 13 minutes.  Residual AHI was 0.8 on 7 to 13 cm of water and an EPR of 2.  There was no significant leak noted.  REVIEW OF SYSTEMS: Out of a complete 14 system review of symptoms, the patient complains only of the following symptoms, none and all other reviewed systems are negative.  ESS: 7  ALLERGIES: Allergies  Allergen Reactions   Penicillins Hives    Shortness of breath    HOME MEDICATIONS: Outpatient Medications Prior to Visit  Medication Sig Dispense Refill   atorvastatin (LIPITOR) 10 MG tablet TAKE 1 TABLET DAILY AT 6 P.M. 90 tablet 3   buPROPion (WELLBUTRIN XL) 150 MG 24 hr tablet Take 150 mg by mouth daily.      desvenlafaxine (PRISTIQ) 50 MG 24 hr tablet Take 100 mg by mouth daily.     Semaglutide,0.25 or 0.5MG /DOS, (OZEMPIC, 0.25 OR 0.5 MG/DOSE,) 2 MG/1.5ML SOPN Inject 0.5 mg into the skin once a week. 1.5 mL 2   Testosterone 20.25 MG/ACT (1.62%) GEL place 2 pumps to upper arm and shoulder once daily 75 g 5   traZODone (DESYREL) 50 MG tablet TAKE 1 TABLET (50 MG TOTAL) BY MOUTH AT BEDTIME AS NEEDED. FOR SLEEP 90 tablet 1   ARIPiprazole (ABILIFY) 10 MG tablet Take 10 mg by mouth daily.     No facility-administered medications prior to visit.    PAST MEDICAL HISTORY: Past Medical History:  Diagnosis Date   Allergy    Anxiety    Arthritis    right big toe    Sleep apnea    wears cpap     PAST SURGICAL HISTORY: Past Surgical History:  Procedure Laterality Date   OPEN REDUCTION SHOULDER DISLOCATION     WISDOM TOOTH EXTRACTION      FAMILY HISTORY: Family History  Problem Relation Age of Onset   Depression Mother    Hearing loss Mother    Colon polyps Mother    Arthritis Father    Diabetes Father    Hearing loss Father    Heart disease  Father    Hyperlipidemia Father    Kidney disease Father    Colon polyps Father    Depression Sister    Stroke Maternal Grandfather    Cancer Paternal Grandmother    Cancer Paternal Grandfather    Colon cancer Neg Hx    Esophageal cancer Neg Hx    Stomach cancer Neg Hx    Rectal cancer Neg Hx     SOCIAL HISTORY: Social History   Socioeconomic History   Marital status: Married    Spouse name: Not on file   Number of children: Not on file   Years of education: Not on file   Highest education level: Not on file  Occupational History   Occupation: Armed forces technical officer   Tobacco Use   Smoking status: Never   Smokeless tobacco: Never  Vaping Use   Vaping Use: Never used  Substance and Sexual Activity   Alcohol use: Not Currently    Comment: social    Drug use: Yes    Types: Marijuana    Comment: twice monthly   Sexual activity: Yes   Other Topics Concern   Not on file  Social History Narrative   Not on file   Social Determinants of Health   Financial Resource Strain: Not on file  Food Insecurity: Not on file  Transportation Needs: Not on file  Physical Activity: Not on file  Stress: Not on file  Social Connections: Not on file  Intimate Partner Violence: Not on file     PHYSICAL EXAM  Vitals:   09/21/21 0929  BP: 127/77  Pulse: 64  Weight: 214 lb 8 oz (97.3 kg)  Height: 5\' 11"  (1.803 m)   Body mass index is 29.92 kg/m.  Generalized: Well developed, in no acute distress  Cardiology: normal rate and rhythm, no murmur noted Respiratory: clear to auscultation bilaterally  Neurological examination  Mentation: Alert oriented to time, place, history taking. Follows all commands speech and language fluent Cranial nerve II-XII: Pupils were equal round reactive to light. Extraocular movements were full, visual field were full  Motor: The motor testing reveals 5 over 5 strength of all 4 extremities. Good symmetric motor tone is noted throughout.  Gait and station: Gait is normal.    DIAGNOSTIC DATA (LABS, IMAGING, TESTING) - I reviewed patient records, labs, notes, testing and imaging myself where available.  No flowsheet data found.   Lab Results  Component Value Date   WBC 6.7 10/15/2019   HGB 16.8 10/15/2019   HCT 48.3 10/15/2019   MCV 89.6 10/15/2019   PLT 193 10/15/2019      Component Value Date/Time   NA 136 05/08/2021 1457   NA 140 06/08/2016 0000   K 3.9 05/08/2021 1457   CL 101 05/08/2021 1457   CO2 25 05/08/2021 1457   GLUCOSE 229 (H) 05/08/2021 1457   BUN 18 05/08/2021 1457   BUN 17 06/08/2016 0000   CREATININE 1.07 05/08/2021 1457   CALCIUM 9.2 05/08/2021 1457   PROT 6.4 05/08/2021 1457   ALBUMIN 4.4 05/08/2021 1457   AST 64 (H) 05/08/2021 1457   ALT 79 (H) 05/08/2021 1457   ALKPHOS 53 05/08/2021 1457   BILITOT 0.6 05/08/2021 1457   GFRNONAA >60 10/15/2019 0622   GFRAA >60  10/15/2019 0622   Lab Results  Component Value Date   CHOL 247 (H) 10/15/2019   HDL 34 (L) 10/15/2019   LDLCALC UNABLE TO CALCULATE IF TRIGLYCERIDE OVER 400 mg/dL 10/15/2019   LDLDIRECT 114.5 (H) 10/15/2019  TRIG 448 (H) 10/15/2019   CHOLHDL 7.3 10/15/2019   Lab Results  Component Value Date   HGBA1C 6.6 (A) 08/09/2021   Lab Results  Component Value Date   VITAMINB12 486 08/01/2018   Lab Results  Component Value Date   TSH 3.883 10/15/2019     ASSESSMENT AND PLAN 56 y.o. year old male  has a past medical history of Allergy, Anxiety, Arthritis, and Sleep apnea. here with     ICD-10-CM   1. OSA on CPAP  G47.33    Z99.89         Ethan Osborn is doing well on CPAP therapy. Compliance report reveals excellent compliance. He was encouraged to continue using CPAP nightly and for greater than 4 hours each night. We will update supply orders as indicated. Risks of untreated sleep apnea review and education materials provided. Healthy lifestyle habits encouraged. He will follow up in 1 year, sooner if needed. He verbalizes understanding and agreement with this plan.    No orders of the defined types were placed in this encounter.    No orders of the defined types were placed in this encounter.     Debbora Presto, FNP-C 09/21/2021, 9:52 AM [facility] [address], [place], [place] [address] [phone]

## 2021-09-21 NOTE — Patient Instructions (Signed)
Below is our plan:  We will continue with our plan, we encourage you to continue using the CPAP nightly and for greater than 4 hours.   Please make sure you are staying well hydrated. I recommend 50-60 ounces daily. Well balanced diet and regular exercise encouraged. Consistent sleep schedule with 6-8 hours recommended.   Please continue follow up with care team as directed.   Follow up with me in 1 year or sooner if needed.  You may receive a survey regarding today's visit. I encourage you to leave honest feed back as I do use this information to improve patient care. Thank you for seeing me today!

## 2021-09-21 NOTE — Progress Notes (Signed)
CM sent to [facility] of new order

## 2021-10-11 ENCOUNTER — Ambulatory Visit: Payer: BC Managed Care – PPO | Admitting: Family Medicine

## 2021-10-31 ENCOUNTER — Encounter: Payer: Self-pay | Admitting: Family Medicine

## 2021-10-31 MED ORDER — TESTOSTERONE 20.25 MG/ACT (1.62%) TD GEL: TRANSDERMAL | 5 refills | Status: DC

## 2021-10-31 NOTE — Telephone Encounter (Signed)
New rx sent in. ? ?Algis Greenhouse. [name], MD ?10/31/2021 12:46 PM  ? ?

## 2021-12-12 ENCOUNTER — Encounter: Payer: Self-pay | Admitting: Family Medicine

## 2021-12-12 ENCOUNTER — Ambulatory Visit: Payer: BC Managed Care – PPO | Admitting: Family Medicine

## 2021-12-12 VITALS — BP 142/86 | HR 63 | Temp 98.5°F | Ht 71.0 in | Wt 219.0 lb

## 2021-12-12 DIAGNOSIS — E1165 Type 2 diabetes mellitus with hyperglycemia: Secondary | ICD-10-CM | POA: Diagnosis not present

## 2021-12-12 DIAGNOSIS — Z23 Encounter for immunization: Secondary | ICD-10-CM

## 2021-12-12 DIAGNOSIS — E785 Hyperlipidemia, unspecified: Secondary | ICD-10-CM | POA: Diagnosis not present

## 2021-12-12 LAB — POCT GLYCOSYLATED HEMOGLOBIN (HGB A1C): Hemoglobin A1C: 6.2 % — AB (ref 4.0–5.6)

## 2021-12-12 MED ORDER — MOUNJARO 2.5 MG/0.5ML ~~LOC~~ SOAJ: 2.5000 mg | SUBCUTANEOUS | 0 refills | Status: DC

## 2021-12-12 NOTE — Assessment & Plan Note (Signed)
On Lipitor 10 mg daily.  Will check lipids next blood draw.  Tolerating current medication well without significant side effects. ?

## 2021-12-12 NOTE — Assessment & Plan Note (Signed)
A1c is improved to 6.2 however his home readings have been in the upper 100s in the morning.  It is possible that his home readings could not be accurate however he is not currently tolerating Ozempic as well as he would like.  We will try Mounjaro instead.  We will start at 2.5 mg weekly for 4 weeks and then increase to 5 mg weekly.  If his insurance will not cover this or if he has significant side effects we will likely go to daily metformin.  He will follow-up with me in 3 to 6 months to recheck A1c.  We discussed reasons to return to care earlier. ?

## 2021-12-12 NOTE — Patient Instructions (Signed)
It was very nice to see you today! ? ?Your A1c is 6.2.  Please try the [facility].  Send me a message in a few weeks let me know how you are doing.  If the [facility] does not work out we will probably get you on metformin instead. ? ?I would like to see you back in 3 to 6 months to recheck your A1c.  Come back sooner if needed. ? ?Take care, ?Dr [name] ? ?PLEASE NOTE: ? ?If you had any lab tests please let us know if you have not heard back within a few days. You may see your results on mychart before we have a chance to review them but we will give you a call once they are reviewed by [place]. If we ordered any referrals today, please let us know if you have not heard from their office within the next week.  ? ?Please try these tips to maintain a healthy lifestyle: ? ?Eat at least 3 REAL meals and 1-2 snacks per day.  Aim for no more than 5 hours between eating.  If you eat breakfast, please do so within one hour of getting up.  ? ?Each meal should contain half fruits/vegetables, one quarter protein, and one quarter carbs (no bigger than a computer mouse) ? ?Cut down on sweet beverages. This includes juice, soda, and sweet tea.  ? ?Drink at least 1 glass of water with each meal and aim for at least 8 glasses per day ? ?Exercise at least 150 minutes every week.   ?

## 2021-12-12 NOTE — Progress Notes (Signed)
? ?  Euclide Granito is a 56 y.o. male who presents today for an office visit. ? ?Assessment/Plan:  ?Chronic Problems Addressed Today: ?Type 2 diabetes mellitus with hyperglycemia ([place]) ?A1c is improved to 6.2 however his home readings have been in the upper 100s in the morning.  It is possible that his home readings could not be accurate however he is not currently tolerating Ozempic as well as he would like.  We will try Mounjaro instead.  We will start at 2.5 mg weekly for 4 weeks and then increase to 5 mg weekly.  If his insurance will not cover this or if he has significant side effects we will likely go to daily metformin.  He will follow-up with me in 3 to 6 months to recheck A1c.  We discussed reasons to return to care earlier. ? ?Dyslipidemia ?On Lipitor 10 mg daily.  Will check lipids next blood draw.  Tolerating current medication well without significant side effects. ? ? ?  ?Subjective:  ?HPI: ? ?See A/p for status of chronic conditions.  ? ?   ?  ?Objective:  ?Physical Exam: ?BP (!) 154/88 (BP Location: Left Arm)   Pulse 63   Temp 98.5 ?F (36.9 ?C) (Temporal)   Ht '5\' 11"'$  (1.803 m)   Wt 219 lb (99.3 kg)   SpO2 96%   BMI 30.54 kg/m?   ?Wt Readings from Last 3 Encounters:  ?12/12/21 219 lb (99.3 kg)  ?09/21/21 214 lb 8 oz (97.3 kg)  ?08/09/21 220 lb (99.8 kg)  ? ? ?Gen: No acute distress, resting comfortably ?CV: Regular rate and rhythm with no murmurs appreciated ?Pulm: Normal work of breathing, clear to auscultation bilaterally with no crackles, wheezes, or rhonchi ?Neuro: Grossly normal, moves all extremities ?Psych: Normal affect and thought content ? ?   ? ?Algis Greenhouse. [name], MD ?12/12/2021 3:02 PM  ?

## 2021-12-14 ENCOUNTER — Telehealth: Payer: Self-pay | Admitting: *Deleted

## 2021-12-14 NOTE — Telephone Encounter (Signed)
PA (Key: [id number]) ?Rx #: [id number] ?Mounjaro 2.'5MG'$ /0.5ML pen-injectors ?Waiting for determination  ?

## 2021-12-14 NOTE — Telephone Encounter (Signed)
PA Message from Plan ?This request has been approved using information available on the patient's profile. [id number];SVEXOG:[id number];Review Type:Prior Auth;Coverage Start Date:11/14/2021;Coverage End Date:12/14/2022; ?

## 2022-01-02 ENCOUNTER — Other Ambulatory Visit: Payer: Self-pay | Admitting: Family Medicine

## 2022-01-03 MED ORDER — MOUNJARO 2.5 MG/0.5ML ~~LOC~~ SOAJ: 2.5000 mg | SUBCUTANEOUS | 0 refills | Status: DC

## 2022-02-07 ENCOUNTER — Encounter: Payer: Self-pay | Admitting: Family Medicine

## 2022-02-07 ENCOUNTER — Other Ambulatory Visit: Payer: Self-pay | Admitting: *Deleted

## 2022-02-07 MED ORDER — TIRZEPATIDE 5 MG/0.5ML ~~LOC~~ SOAJ: 5.0000 mg | SUBCUTANEOUS | 1 refills | Status: DC

## 2022-02-09 ENCOUNTER — Ambulatory Visit: Payer: BC Managed Care – PPO | Admitting: Family Medicine

## 2022-02-21 ENCOUNTER — Encounter: Payer: Self-pay | Admitting: Family Medicine

## 2022-02-21 ENCOUNTER — Ambulatory Visit: Payer: BC Managed Care – PPO | Admitting: Family Medicine

## 2022-02-21 VITALS — BP 118/70 | HR 77 | Temp 97.7°F | Ht 71.0 in | Wt 211.2 lb

## 2022-02-21 DIAGNOSIS — E1165 Type 2 diabetes mellitus with hyperglycemia: Secondary | ICD-10-CM | POA: Diagnosis not present

## 2022-02-21 DIAGNOSIS — Z23 Encounter for immunization: Secondary | ICD-10-CM | POA: Diagnosis not present

## 2022-02-21 DIAGNOSIS — E785 Hyperlipidemia, unspecified: Secondary | ICD-10-CM | POA: Diagnosis not present

## 2022-02-21 NOTE — Progress Notes (Signed)
   Ethan Osborn is a 56 y.o. male who presents today for an office visit.  Assessment/Plan:  Chronic Problems Addressed Today: Type 2 diabetes mellitus with hyperglycemia (Ethan Osborn) Doing well with Mounjaro with minimal side effects.  He has been on a 5 mg weekly dose for the last week and a half.  Seems to be going well.  His home sugars have been in the low 120s.  It is too early to recheck his A1c today.  We will continue current dose.  He will follow-up in a few weeks via MyChart and we can titrate the dose as needed.  He will come back soon for CPE and we can recheck A1c at that time.  Dyslipidemia On Lipitor 10 mg daily.  We will check lipids Ethan Osborn back in for CPE.  Tolerating medications well.  Shingles vaccine given today.    Subjective:  HPI:  See A/P for status of chronic conditions       Objective:  Physical Exam: BP 118/70   Pulse 77   Temp 97.7 F (36.5 C) (Temporal)   Ht '5\' 11"'$  (1.803 m)   Wt 211 lb 3.2 oz (95.8 kg)   SpO2 97%   BMI 29.46 kg/m   Wt Readings from Last 3 Encounters:  02/21/22 211 lb 3.2 oz (95.8 kg)  12/12/21 219 lb (99.3 kg)  09/21/21 214 lb 8 oz (97.3 kg)  Gen: No acute distress, resting comfortably CV: Regular rate and rhythm with no murmurs appreciated Pulm: Normal work of breathing, clear to auscultation bilaterally with no crackles, wheezes, or rhonchi Neuro: Grossly normal, moves all extremities Psych: Normal affect and thought content      Ethan Osborn M. Ethan Pain, MD 02/21/2022 11:10 AM

## 2022-02-21 NOTE — Assessment & Plan Note (Signed)
On Lipitor 10 mg daily.  We will check lipids [name] back in for CPE.  Tolerating medications well.

## 2022-02-21 NOTE — Assessment & Plan Note (Signed)
Doing well with Mounjaro with minimal side effects.  He has been on a 5 mg weekly dose for the last week and a half.  Seems to be going well.  His home sugars have been in the low 120s.  It is too early to recheck his A1c today.  We will continue current dose.  He will follow-up in a few weeks via MyChart and we can titrate the dose as needed.  He will come back soon for CPE and we can recheck A1c at that time.

## 2022-02-21 NOTE — Patient Instructions (Signed)
It was very nice to see you today!  No changes today.  Keep up the good work with your diet and exercise.  We will check blood work when you come back for a physical in 3 months.  Please come back to see [place] sooner if needed.  Take care, Dr [name]  PLEASE NOTE:  If you had any lab tests please let us know if you have not heard back within a few days. You may see your results on mychart before we have a chance to review them but we will give you a call once they are reviewed by [place]. If we ordered any referrals today, please let us know if you have not heard from their office within the next week.   Please try these tips to maintain a healthy lifestyle:  Eat at least 3 REAL meals and 1-2 snacks per day.  Aim for no more than 5 hours between eating.  If you eat breakfast, please do so within one hour of getting up.   Each meal should contain half fruits/vegetables, one quarter protein, and one quarter carbs (no bigger than a computer mouse)  Cut down on sweet beverages. This includes juice, soda, and sweet tea.   Drink at least 1 glass of water with each meal and aim for at least 8 glasses per day  Exercise at least 150 minutes every week.

## 2022-04-06 ENCOUNTER — Encounter: Payer: Self-pay | Admitting: Family Medicine

## 2022-04-10 NOTE — Telephone Encounter (Signed)
Agree with going up on dose. We can go to 7.'5mg'$  weekly.  Menopause

## 2022-04-12 ENCOUNTER — Other Ambulatory Visit: Payer: Self-pay | Admitting: *Deleted

## 2022-04-12 MED ORDER — TIRZEPATIDE 7.5 MG/0.5ML ~~LOC~~ SOAJ: 7.5000 mg | SUBCUTANEOUS | 1 refills | Status: DC

## 2022-04-26 ENCOUNTER — Ambulatory Visit: Payer: BC Managed Care – PPO | Admitting: Family Medicine

## 2022-04-26 VITALS — BP 115/70 | HR 64 | Temp 98.3°F | Ht 71.0 in | Wt 215.4 lb

## 2022-04-26 DIAGNOSIS — E785 Hyperlipidemia, unspecified: Secondary | ICD-10-CM

## 2022-04-26 DIAGNOSIS — E1165 Type 2 diabetes mellitus with hyperglycemia: Secondary | ICD-10-CM | POA: Diagnosis not present

## 2022-04-26 DIAGNOSIS — Z23 Encounter for immunization: Secondary | ICD-10-CM | POA: Diagnosis not present

## 2022-04-26 LAB — POCT GLYCOSYLATED HEMOGLOBIN (HGB A1C): Hemoglobin A1C: 5.8 % — AB (ref 4.0–5.6)

## 2022-04-26 MED ORDER — BLOOD GLUCOSE MONITOR KIT: PACK | 0 refills | Status: AC

## 2022-04-26 NOTE — Assessment & Plan Note (Signed)
A1c well controlled 5.8.  Will continue Mounjaro 7.5 mg weekly.  He will follow-up in 6 months for CPE and we can recheck lipids at that time.

## 2022-04-26 NOTE — Assessment & Plan Note (Signed)
On Lipitor 10 mg daily.  We will recheck in 6 months when he comes back for CPE.

## 2022-04-26 NOTE — Progress Notes (Signed)
   Ethan Osborn is a 56 y.o. male who presents today for an office visit.  Assessment/Plan:  Chronic Problems Addressed Today: Type 2 diabetes mellitus with hyperglycemia ([facility]) A1c well controlled 5.8.  Will continue Mounjaro 7.5 mg weekly.  He will follow-up in 6 months for CPE and we can recheck lipids at that time.  Dyslipidemia On Lipitor 10 mg daily.  We will recheck in 6 months when he comes back for CPE.  Flu shot given today.    Subjective:  HPI:  See A/P for status or current conditions.       Objective:  Physical Exam: BP 115/70   Pulse 64   Temp 98.3 F (36.8 C) (Temporal)   Ht '5\' 11"'$  (1.803 m)   Wt 215 lb 6.4 oz (97.7 kg)   SpO2 97%   BMI 30.04 kg/m   Wt Readings from Last 3 Encounters:  04/26/22 215 lb 6.4 oz (97.7 kg)  02/21/22 211 lb 3.2 oz (95.8 kg)  12/12/21 219 lb (99.3 kg)    Gen: No acute distress, resting comfortably CV: Regular rate and rhythm with no murmurs appreciated Pulm: Normal work of breathing, clear to auscultation bilaterally with no crackles, wheezes, or rhonchi Neuro: Grossly normal, moves all extremities Psych: Normal affect and thought content      Sebert Stollings M. [name], MD 04/26/2022 3:01 PM

## 2022-04-26 NOTE — Patient Instructions (Signed)
It was very nice to see you today!  Your A1c looks great!  No medication changes today  I will see back in 6 months for your annual physical with labs.  Come back sooner if needed.  Take care, Dr [name]  PLEASE NOTE:  If you had any lab tests please let us know if you have not heard back within a few days. You may see your results on mychart before we have a chance to review them but we will give you a call once they are reviewed by [place]. If we ordered any referrals today, please let us know if you have not heard from their office within the next week.   Please try these tips to maintain a healthy lifestyle:  Eat at least 3 REAL meals and 1-2 snacks per day.  Aim for no more than 5 hours between eating.  If you eat breakfast, please do so within one hour of getting up.   Each meal should contain half fruits/vegetables, one quarter protein, and one quarter carbs (no bigger than a computer mouse)  Cut down on sweet beverages. This includes juice, soda, and sweet tea.   Drink at least 1 glass of water with each meal and aim for at least 8 glasses per day  Exercise at least 150 minutes every week.

## 2022-05-02 ENCOUNTER — Other Ambulatory Visit: Payer: Self-pay | Admitting: Family Medicine

## 2022-05-16 LAB — HM DIABETES EYE EXAM

## 2022-05-24 ENCOUNTER — Other Ambulatory Visit: Payer: Self-pay | Admitting: Family Medicine

## 2022-07-23 ENCOUNTER — Telehealth: Payer: Self-pay | Admitting: *Deleted

## 2022-07-23 NOTE — Telephone Encounter (Signed)
Key: [id number] Drug is covered by current benefit plan. No further PA activity needed Drug Testosterone 20.25 MG/ACT(1.62%) ge

## 2022-07-24 ENCOUNTER — Other Ambulatory Visit: Payer: Self-pay | Admitting: Family Medicine

## 2022-09-24 NOTE — Progress Notes (Unsigned)
   PATIENT: Ethan Osborn DOB: 15-Oct-1965  REASON FOR VISIT: follow up HISTORY FROM: patient  Virtual Visit via Telephone Note  I connected with Ethan Osborn on 09/25/22 at  8:30 AM EST by telephone and verified that I am speaking with the correct person using two identifiers.   I discussed the limitations, risks, security and privacy concerns of performing an evaluation and management service by telephone and the availability of in person appointments. I also discussed with the patient that there may be a patient responsible charge related to this service. The patient expressed understanding and agreed to proceed.   History of Present Illness:  09/25/22 ALL: Keiandre Cygan is a 57 y.o. male here today for follow up for OSA on CPAP. He continues to do well. He is using CPAP nightly for about 7-8 hours. No concerns with machine or supplies.     09/21/21 ALL:  Keghan Mcfarren is a 56 y.o. male here today for follow up for OSA on CPAP.  His compliance report below demonstrates excellent compliance wih both days used and for >4 hours. His apnea is well managed with an AHI of 0.9. He reports that he is doing great and has no complaints at this time.        Observations/Objective:  Generalized: Well developed, in no acute distress  Mentation: Alert oriented to time, place, history taking. Follows all commands speech and language fluent   Assessment and Plan:  57 y.o. year old male  has a past medical history of Allergy, Anxiety, Arthritis, and Sleep apnea. here with    ICD-10-CM   1. OSA on CPAP  G47.33 For home use only DME continuous positive airway pressure (CPAP)      Carlen is doing well. Compliance report reveals excellent compliance. He was encouraged to continue CPAP nightly for at least 4 hours. Supply orders updated. Healthy lifestyle habits encouraged. He wlil follow up in 1 year.    Orders Placed This Encounter  Procedures   For home use only DME continuous  positive airway pressure (CPAP)    Supplies    Order Specific Question:   Length of Need    Answer:   Lifetime    Order Specific Question:   Patient has OSA or probable OSA    Answer:   Yes    Order Specific Question:   Is the patient currently using CPAP in the home    Answer:   Yes    Order Specific Question:   Settings    Answer:   Other see comments    Order Specific Question:   CPAP supplies needed    Answer:   Mask, headgear, cushions, filters, heated tubing and water chamber    No orders of the defined types were placed in this encounter.    Follow Up Instructions:  I discussed the assessment and treatment plan with the patient. The patient was provided an opportunity to ask questions and all were answered. The patient agreed with the plan and demonstrated an understanding of the instructions.   The patient was advised to call back or seek an in-person evaluation if the symptoms worsen or if the condition fails to improve as anticipated.  I provided 15 minutes of non-face-to-face time during this encounter. Patient located at their place of residence during [place] visit. Provider is in the office.    Debbora Presto, NP

## 2022-09-24 NOTE — Patient Instructions (Signed)

## 2022-09-25 ENCOUNTER — Other Ambulatory Visit: Payer: Self-pay | Admitting: Family Medicine

## 2022-09-25 ENCOUNTER — Telehealth (INDEPENDENT_AMBULATORY_CARE_PROVIDER_SITE_OTHER): Payer: 59 | Admitting: Family Medicine

## 2022-09-25 ENCOUNTER — Encounter: Payer: Self-pay | Admitting: Family Medicine

## 2022-09-25 DIAGNOSIS — G4733 Obstructive sleep apnea (adult) (pediatric): Secondary | ICD-10-CM | POA: Diagnosis not present

## 2022-10-22 ENCOUNTER — Ambulatory Visit (INDEPENDENT_AMBULATORY_CARE_PROVIDER_SITE_OTHER): Payer: 59 | Admitting: Family Medicine

## 2022-10-22 ENCOUNTER — Encounter: Payer: Self-pay | Admitting: Family Medicine

## 2022-10-22 VITALS — BP 135/76 | HR 62 | Temp 97.8°F | Ht 71.0 in | Wt 227.4 lb

## 2022-10-22 DIAGNOSIS — Z0001 Encounter for general adult medical examination with abnormal findings: Secondary | ICD-10-CM | POA: Diagnosis not present

## 2022-10-22 DIAGNOSIS — E1165 Type 2 diabetes mellitus with hyperglycemia: Secondary | ICD-10-CM

## 2022-10-22 DIAGNOSIS — E1169 Type 2 diabetes mellitus with other specified complication: Secondary | ICD-10-CM | POA: Diagnosis not present

## 2022-10-22 DIAGNOSIS — R7989 Other specified abnormal findings of blood chemistry: Secondary | ICD-10-CM | POA: Diagnosis not present

## 2022-10-22 DIAGNOSIS — E785 Hyperlipidemia, unspecified: Secondary | ICD-10-CM | POA: Diagnosis not present

## 2022-10-22 DIAGNOSIS — F332 Major depressive disorder, recurrent severe without psychotic features: Secondary | ICD-10-CM | POA: Diagnosis not present

## 2022-10-22 NOTE — Progress Notes (Signed)
Chief Complaint:  Ethan Osborn is a 57 y.o. male who presents today for his annual comprehensive physical exam.    Assessment/Plan:  Chronic Problems Addressed Today: Type 2 diabetes mellitus with hyperglycemia ([place]) Last A1c stable 5.8.  Recheck today.  Continue Mounjaro 7.5 mg weekly.  Dyslipidemia associated with type 2 diabetes mellitus (H[facility]) On Lipitor 10 mg daily.  Check lipids.  Discussed lifestyle modifications.  MDD (major depressive disorder), recurrent episode, severe ([place]) Follows with psychiatry.  On Wellbutrin 150 mg daily, Pristiq 100 mg daily, and Abilify 10 mg daily.  He is doing very well with this combination.  No SI or HI.  Low testosterone Doing well on testosterone replacement 2 pumps daily.  Will check testosterone, CBC, and PSA today.  Low vitamin D level Check vitamin D.   Preventative Healthcare: Check labs. Due for colonoscopy next year. UTD on vaccines.   Patient Counseling(The following topics were reviewed and/or handout was given):  -Nutrition: Stressed importance of moderation in sodium/caffeine intake, saturated fat and cholesterol, caloric balance, sufficient intake of fresh fruits, vegetables, and fiber.  -Stressed the importance of regular exercise.   -Substance Abuse: Discussed cessation/primary prevention of tobacco, alcohol, or other drug use; driving or other dangerous activities under the influence; availability of treatment for abuse.   -Injury prevention: Discussed safety belts, safety helmets, smoke detector, smoking near bedding or upholstery.   -Sexuality: Discussed sexually transmitted diseases, partner selection, use of condoms, avoidance of unintended pregnancy and contraceptive alternatives.   -Dental health: Discussed importance of regular tooth brushing, flossing, and dental visits.  -Health maintenance and immunizations reviewed. Please refer to Health maintenance section.  Return to care in 1 year for next preventative visit.      Subjective:  HPI:  He has no acute complaints today.  See A/p for status of chronic conditions.   Lifestyle Diet: Balanced. Plenty of fruits and vegetables.  Exercise: Resistance and cardio 3-5 times per week.      10/22/2022    2:14 PM  Depression screen PHQ 2/9  Decreased Interest 0  Down, Depressed, Hopeless 0  PHQ - 2 Score 0  Altered sleeping 0  Tired, decreased energy 0  Change in appetite 0  Feeling bad or failure about yourself  0  Trouble concentrating 0  Moving slowly or fidgety/restless 0  Suicidal thoughts 0  PHQ-9 Score 0  Difficult doing work/chores Not difficult at all    Health Maintenance Due  Topic Date Due   OPHTHALMOLOGY EXAM  Never done   Diabetic kidney evaluation - Urine ACR  Never done   Diabetic kidney evaluation - eGFR measurement  05/08/2022   FOOT EXAM  05/08/2022     ROS: Per HPI, otherwise a complete review of systems was negative.   PMH:  The following were reviewed and entered/updated in epic: Past Medical History:  Diagnosis Date   Allergy    Anxiety    Arthritis    right big toe    Sleep apnea    wears cpap    Patient Active Problem List   Diagnosis Date Noted   Dyslipidemia associated with type 2 diabetes mellitus ([place]) 10/30/2019   Type 2 diabetes mellitus with hyperglycemia ([place]) 10/30/2019   MDD (major depressive disorder), recurrent episode, severe ([place]) 10/14/2019   Sleep apnea    Arthritis    Allergy    Seborrheic keratoses 09/18/2018   Low testosterone 08/04/2018   Low vitamin D level 08/04/2018   Actinic keratosis 07/01/2018  Shoulder dislocation, right, initial encounter 03/10/2018   Rash 07/18/2017   GAD (generalized anxiety disorder) 01/08/2017   Past Surgical History:  Procedure Laterality Date   OPEN REDUCTION SHOULDER DISLOCATION     WISDOM TOOTH EXTRACTION      Family History  Problem Relation Age of Onset   Depression Mother    Hearing loss Mother    Colon polyps Mother    Arthritis  Father    Diabetes Father    Hearing loss Father    Heart disease Father    Hyperlipidemia Father    Kidney disease Father    Colon polyps Father    Depression Sister    Stroke Maternal Grandfather    Cancer Paternal Grandmother    Cancer Paternal Grandfather    Colon cancer Neg Hx    Esophageal cancer Neg Hx    Stomach cancer Neg Hx    Rectal cancer Neg Hx     Medications- reviewed and updated Current Outpatient Medications  Medication Sig Dispense Refill   ARIPiprazole (ABILIFY) 10 MG tablet Take 10 mg by mouth daily.     atorvastatin (LIPITOR) 10 MG tablet TAKE 1 TABLET DAILY AT 6 P.M. 90 tablet 3   blood glucose meter kit and supplies KIT Dispense based on patient and insurance preference. Use up to four times daily as directed. 1 each 0   buPROPion (WELLBUTRIN XL) 150 MG 24 hr tablet Take 150 mg by mouth daily.     desvenlafaxine (PRISTIQ) 50 MG 24 hr tablet Take 100 mg by mouth daily.     Testosterone 20.25 MG/ACT (1.62%) GEL PLACE 2 PUMPS TO UPPER ARM AND SHOULDER ONCE DAILY 75 g 5   tirzepatide (MOUNJARO) 7.5 MG/0.5ML Pen INJECT 7.5 MG SUBCUTANEOUSLY WEEKLY 3 mL 1   No current facility-administered medications for this visit.    Allergies-reviewed and updated Allergies  Allergen Reactions   Penicillins Hives    Shortness of breath    Social History   Socioeconomic History   Marital status: Married    Spouse name: Not on file   Number of children: Not on file   Years of education: Not on file   Highest education level: Not on file  Occupational History   Occupation: Armed forces technical officer   Tobacco Use   Smoking status: Never   Smokeless tobacco: Never  Vaping Use   Vaping Use: Never used  Substance and Sexual Activity   Alcohol use: Not Currently    Comment: social    Drug use: Yes    Types: Marijuana    Comment: twice monthly   Sexual activity: Yes  Other Topics Concern   Not on file  Social History Narrative   Not on file   Social Determinants  of Health   Financial Resource Strain: Not on file  Food Insecurity: Not on file  Transportation Needs: Not on file  Physical Activity: Not on file  Stress: Not on file  Social Connections: Not on file        Objective:  Physical Exam: BP 135/76   Pulse 62   Temp 97.8 F (36.6 C) (Temporal)   Ht '5\' 11"'$  (1.803 m)   Wt 227 lb 6.4 oz (103.1 kg)   SpO2 96%   BMI 31.72 kg/m   Body mass index is 31.72 kg/m. Wt Readings from Last 3 Encounters:  10/22/22 227 lb 6.4 oz (103.1 kg)  04/26/22 215 lb 6.4 oz (97.7 kg)  02/21/22 211 lb 3.2 oz (95.8 kg)  Gen: NAD, resting comfortably HEENT: TMs normal bilaterally. OP clear. No thyromegaly noted.  CV: RRR with no murmurs appreciated Pulm: NWOB, CTAB with no crackles, wheezes, or rhonchi GI: Normal bowel sounds present. Soft, Nontender, Nondistended. MSK: no edema, cyanosis, or clubbing noted Skin: warm, dry Neuro: CN2-12 grossly intact. Strength 5/5 in upper and lower extremities. Reflexes symmetric and intact bilaterally.  Psych: Normal affect and thought content     Ethan Osborn M. [name], MD 10/22/2022 2:44 PM

## 2022-10-22 NOTE — Assessment & Plan Note (Signed)
Doing well on testosterone replacement 2 pumps daily.  Will check testosterone, CBC, and PSA today.

## 2022-10-22 NOTE — Assessment & Plan Note (Signed)
Last A1c stable 5.8.  Recheck today.  Continue Mounjaro 7.5 mg weekly.

## 2022-10-22 NOTE — Patient Instructions (Signed)
It was very nice to see you today!  We will check blood work today.  Please keep up the great work with your diet and exercise.  Will see back in 6 months to recheck your A1c.  Please come back to see Ethan Osborn sooner if needed.  Take care, Dr [name]  PLEASE NOTE:  If you had any lab tests, please let Ethan Osborn know if you have not heard back within a few days. You may see your results on mychart before we have a chance to review them but we will give you a call once they are reviewed by Ethan Osborn.   If we ordered any referrals today, please let Ethan Osborn know if you have not heard from their office within the next week.   If you had any urgent prescriptions sent in today, please check with the pharmacy within an hour of our visit to make sure the prescription was transmitted appropriately.   Please try these tips to maintain a healthy lifestyle:  Eat at least 3 REAL meals and 1-2 snacks per day.  Aim for no more than 5 hours between eating.  If you eat breakfast, please do so within one hour of getting up.   Each meal should contain half fruits/vegetables, one quarter protein, and one quarter carbs (no bigger than a computer mouse)  Cut down on sweet beverages. This includes juice, soda, and sweet tea.   Drink at least 1 glass of water with each meal and aim for at least 8 glasses per day  Exercise at least 150 minutes every week.    Preventive Care 45-33 Years Old, Male Preventive care refers to lifestyle choices and visits with your health care provider that can promote health and wellness. Preventive care visits are also called wellness exams. What can I expect for my preventive care visit? Counseling During your preventive care visit, your health care provider may ask about your: Medical history, including: Past medical problems. Family medical history. Current health, including: Emotional well-being. Home life and relationship well-being. Sexual activity. Lifestyle, including: Alcohol, nicotine  or tobacco, and drug use. Access to firearms. Diet, exercise, and sleep habits. Safety issues such as seatbelt and bike helmet use. Sunscreen use. Work and work Statistician. Physical exam Your health care provider will check your: Height and weight. These may be used to calculate your BMI (body mass index). BMI is a measurement that tells if you are at a healthy weight. Waist circumference. This measures the distance around your waistline. This measurement also tells if you are at a healthy weight and may help predict your risk of certain diseases, such as type 2 diabetes and high blood pressure. Heart rate and blood pressure. Body temperature. Skin for abnormal spots. What immunizations do I need?  Vaccines are usually given at various ages, according to a schedule. Your health care provider will recommend vaccines for you based on your age, medical history, and lifestyle or other factors, such as travel or where you work. What tests do I need? Screening Your health care provider may recommend screening tests for certain conditions. This may include: Lipid and cholesterol levels. Diabetes screening. This is done by checking your blood sugar (glucose) after you have not eaten for a while (fasting). Hepatitis B test. Hepatitis C test. HIV (human immunodeficiency virus) test. STI (sexually transmitted infection) testing, if you are at risk. Lung cancer screening. Prostate cancer screening. Colorectal cancer screening. Talk with your health care provider about your test results, treatment options, and if necessary,  the need for more tests. Follow these instructions at home: Eating and drinking  Eat a diet that includes fresh fruits and vegetables, whole grains, lean protein, and low-fat dairy products. Take vitamin and mineral supplements as recommended by your health care provider. Do not drink alcohol if your health care provider tells you not to drink. If you drink alcohol: Limit  how much you have to 0-2 drinks a day. Know how much alcohol is in your drink. In the [place] one drink equals one 12 oz bottle of beer (355 mL), one 5 oz glass of wine (148 mL), or one 1 oz glass of hard liquor (44 mL). Lifestyle Brush your teeth every morning and night with fluoride toothpaste. Floss one time each day. Exercise for at least 30 minutes 5 or more days each week. Do not use any products that contain nicotine or tobacco. These products include cigarettes, chewing tobacco, and vaping devices, such as e-cigarettes. If you need help quitting, ask your health care provider. Do not use drugs. If you are sexually active, practice safe sex. Use a condom or other form of protection to prevent STIs. Take aspirin only as told by your health care provider. Make sure that you understand how much to take and what form to take. Work with your health care provider to find out whether it is safe and beneficial for you to take aspirin daily. Find healthy ways to manage stress, such as: Meditation, yoga, or listening to music. Journaling. Talking to a trusted person. Spending time with friends and family. Minimize exposure to UV radiation to reduce your risk of skin cancer. Safety Always wear your seat belt while driving or riding in a vehicle. Do not drive: If you have been drinking alcohol. Do not ride with someone who has been drinking. When you are tired or distracted. While texting. If you have been using any mind-altering substances or drugs. Wear a helmet and other protective equipment during sports activities. If you have firearms in your house, make sure you follow all gun safety procedures. What's next? Go to your health care provider once a year for an annual wellness visit. Ask your health care provider how often you should have your eyes and teeth checked. Stay up to date on all vaccines. This information is not intended to replace advice given to you by your health care  provider. Make sure you discuss any questions you have with your health care provider. Document Revised: 01/25/2021 Document Reviewed: 01/25/2021 Elsevier Patient Education  [place].

## 2022-10-22 NOTE — Assessment & Plan Note (Signed)
Follows with psychiatry.  On Wellbutrin 150 mg daily, Pristiq 100 mg daily, and Abilify 10 mg daily.  He is doing very well with this combination.  No SI or HI.

## 2022-10-22 NOTE — Assessment & Plan Note (Signed)
On Lipitor 10 mg daily.  Check lipids.  Discussed lifestyle modifications.

## 2022-10-22 NOTE — Assessment & Plan Note (Signed)
Check vitamin D. 

## 2022-10-23 LAB — LIPID PANEL
Cholesterol: 132 mg/dL (ref 0–200)
HDL: 34.9 mg/dL — ABNORMAL LOW (ref >39.00)
NonHDL: 97.51
Total CHOL/HDL Ratio: 4
Triglycerides: 265 mg/dL — ABNORMAL HIGH (ref 0.0–149.0)
VLDL: 53 mg/dL — ABNORMAL HIGH (ref 0.0–40.0)

## 2022-10-23 LAB — CBC
HCT: 46.1 % (ref 39.0–52.0)
Hemoglobin: 15.8 g/dL (ref 13.0–17.0)
MCHC: 34.3 g/dL (ref 30.0–36.0)
MCV: 90.9 fl (ref 78.0–100.0)
Platelets: 218 10*3/uL (ref 150.0–400.0)
RBC: 5.08 Mil/uL (ref 4.22–5.81)
RDW: 13.1 % (ref 11.5–15.5)
WBC: 6.4 10*3/uL (ref 4.0–10.5)

## 2022-10-23 LAB — COMPREHENSIVE METABOLIC PANEL
ALT: 29 U/L (ref 0–53)
AST: 22 U/L (ref 0–37)
Albumin: 4 g/dL (ref 3.5–5.2)
Alkaline Phosphatase: 63 U/L (ref 39–117)
BUN: 19 mg/dL (ref 6–23)
CO2: 28 mEq/L (ref 19–32)
Calcium: 9.6 mg/dL (ref 8.4–10.5)
Chloride: 101 mEq/L (ref 96–112)
Creatinine, Ser: 1.1 mg/dL (ref 0.40–1.50)
GFR: 75.81 mL/min (ref >60.00)
Glucose, Bld: 100 mg/dL — ABNORMAL HIGH (ref 70–99)
Potassium: 4.2 mEq/L (ref 3.5–5.1)
Sodium: 139 mEq/L (ref 135–145)
Total Bilirubin: 0.7 mg/dL (ref 0.2–1.2)
Total Protein: 6.1 g/dL (ref 6.0–8.3)

## 2022-10-23 LAB — VITAMIN D 25 HYDROXY (VIT D DEFICIENCY, FRACTURES): VITD: 41.4 ng/mL (ref 30.00–100.00)

## 2022-10-23 LAB — TSH: TSH: 2.73 u[IU]/mL (ref 0.35–5.50)

## 2022-10-23 LAB — TESTOSTERONE: Testosterone: 248.31 ng/dL — ABNORMAL LOW (ref 300.00–890.00)

## 2022-10-23 LAB — LDL CHOLESTEROL, DIRECT: Direct LDL: 69 mg/dL

## 2022-10-23 LAB — PSA: PSA: 1.53 ng/mL (ref 0.10–4.00)

## 2022-10-23 LAB — HEMOGLOBIN A1C: Hgb A1c MFr Bld: 6 % (ref 4.6–6.5)

## 2022-10-24 ENCOUNTER — Other Ambulatory Visit: Payer: Self-pay | Admitting: *Deleted

## 2022-10-24 DIAGNOSIS — R7989 Other specified abnormal findings of blood chemistry: Secondary | ICD-10-CM

## 2022-10-24 NOTE — Progress Notes (Signed)
Please inform patient of the following:  Cholesterol is much better than last time.  He can continue Lipitor 10 mg daily and we can recheck this in year or so.  His A1c is up slightly to 6.0.  We can continue his current dose of Mounjaro 7.5 mg weekly.  We should recheck this in 6 months at his next office visit.  Testosterone is slightly below goal.  If he is doing well we can continue his current dose however would be reasonable for him to go to 3 times daily and recheck in a few weeks if he wishes.  Please place future order for testosterone if he decides to go up on the dose.  Everything else is stable and we can recheck in a year.

## 2022-11-05 ENCOUNTER — Encounter: Payer: Self-pay | Admitting: Family Medicine

## 2022-11-14 ENCOUNTER — Other Ambulatory Visit: Payer: 59

## 2022-11-16 ENCOUNTER — Other Ambulatory Visit: Payer: 59

## 2022-11-16 ENCOUNTER — Other Ambulatory Visit: Payer: Self-pay | Admitting: Family Medicine

## 2023-01-16 ENCOUNTER — Encounter: Payer: Self-pay | Admitting: Family Medicine

## 2023-01-17 MED ORDER — TESTOSTERONE 20.25 MG/ACT (1.62%) TD GEL: TRANSDERMAL | 5 refills | Status: DC

## 2023-02-08 ENCOUNTER — Other Ambulatory Visit: Payer: Self-pay | Admitting: Family Medicine

## 2023-02-08 MED ORDER — MOUNJARO 7.5 MG/0.5ML ~~LOC~~ SOAJ: 7.5000 mg | SUBCUTANEOUS | 0 refills | Status: DC

## 2023-02-15 ENCOUNTER — Telehealth: Payer: Self-pay | Admitting: *Deleted

## 2023-02-15 NOTE — Telephone Encounter (Signed)
Key: [id number] - PA Case ID: [id number] - Rx #: [id number] Outcome This request has been approved  [id number]; Coverage Start Date:01/16/2023;Coverage End Date:02/15/2024; Drug Mounjaro 7.5MG /0.5ML pen-injectors

## 2023-02-18 ENCOUNTER — Encounter: Payer: Self-pay | Admitting: Family Medicine

## 2023-02-18 ENCOUNTER — Other Ambulatory Visit: Payer: Self-pay | Admitting: Family Medicine

## 2023-02-18 ENCOUNTER — Other Ambulatory Visit: Payer: Self-pay | Admitting: *Deleted

## 2023-02-18 MED ORDER — MOUNJARO 7.5 MG/0.5ML ~~LOC~~ SOAJ: 7.5000 mg | SUBCUTANEOUS | 0 refills | Status: DC

## 2023-02-21 ENCOUNTER — Other Ambulatory Visit: Payer: Self-pay | Admitting: Family Medicine

## 2023-03-28 ENCOUNTER — Encounter (INDEPENDENT_AMBULATORY_CARE_PROVIDER_SITE_OTHER): Payer: Self-pay

## 2023-04-04 ENCOUNTER — Telehealth: Payer: Self-pay | Admitting: Family Medicine

## 2023-04-04 NOTE — Telephone Encounter (Signed)
LVM and sent mychart msg informing pt of r/s needed for 09/30/23 appt- NP out.

## 2023-04-29 ENCOUNTER — Encounter: Payer: Self-pay | Admitting: Family Medicine

## 2023-04-29 ENCOUNTER — Ambulatory Visit: Payer: 59 | Admitting: Family Medicine

## 2023-04-29 VITALS — BP 125/74 | HR 63 | Temp 97.5°F | Ht 71.0 in | Wt 206.4 lb

## 2023-04-29 DIAGNOSIS — R350 Frequency of micturition: Secondary | ICD-10-CM | POA: Diagnosis not present

## 2023-04-29 DIAGNOSIS — Z7985 Long-term (current) use of injectable non-insulin antidiabetic drugs: Secondary | ICD-10-CM

## 2023-04-29 DIAGNOSIS — F332 Major depressive disorder, recurrent severe without psychotic features: Secondary | ICD-10-CM

## 2023-04-29 DIAGNOSIS — E1165 Type 2 diabetes mellitus with hyperglycemia: Secondary | ICD-10-CM

## 2023-04-29 DIAGNOSIS — Z23 Encounter for immunization: Secondary | ICD-10-CM

## 2023-04-29 DIAGNOSIS — R42 Dizziness and giddiness: Secondary | ICD-10-CM | POA: Diagnosis not present

## 2023-04-29 LAB — POCT GLYCOSYLATED HEMOGLOBIN (HGB A1C): Hemoglobin A1C: 5.1 % (ref 4.0–5.6)

## 2023-04-29 LAB — URINALYSIS, ROUTINE W REFLEX MICROSCOPIC
Bilirubin Urine: NEGATIVE
Hgb urine dipstick: NEGATIVE
Ketones, ur: NEGATIVE
Leukocytes,Ua: NEGATIVE
Nitrite: NEGATIVE
RBC / HPF: NONE SEEN (ref 0–?)
Specific Gravity, Urine: <=1.005 — AB (ref 1.000–1.030)
Total Protein, Urine: NEGATIVE
Urine Glucose: NEGATIVE
Urobilinogen, UA: 0.2 (ref 0.0–1.0)
WBC, UA: NONE SEEN (ref 0–?)
pH: 6 (ref 5.0–8.0)

## 2023-04-29 NOTE — Assessment & Plan Note (Signed)
Follows with psychiatry.  Recently stopped Abilify and Wellbutrin.  Now on Pristiq 100 mg daily only.  Doing well at this.

## 2023-04-29 NOTE — Progress Notes (Signed)
   Michelle Bova is a 57 y.o. male who presents today for an office visit.  Assessment/Plan:  New/Acute Problems: Urinary Frequency  Likely BPH.  Will check UA and urine culture as well as bloodwork today.  We discussed briefly treatment options including trial of medication such as flomax versus referral to urology however we will defer on either of these until we get labs back as above.  Will treat for presumed BPH if above workup is negative.  Lightheadedness Vague symptoms.  Reassuring exam today.  May have developing viral URI.  Will check labs today as above to rule out other potential causes.  Also possible he could be getting hypoglycemic episodes from his Mounjaro.  If symptoms persist and labs are negative would consider decreasing Mounjaro to 5 mg weekly to see if he does better with this.  Discussed reasons to return to care and seek emergent care.  Chronic Problems Addressed Today: Type 2 diabetes mellitus with hyperglycemia ([facility]) A1c 5.1.  Doing well with Mounjaro.  Continue 7.5 mg weekly.  Recheck in 6 months at CPE.  MDD (major depressive disorder), recurrent episode, severe ([facility]) Follows with psychiatry.  Recently stopped Abilify and Wellbutrin.  Now on Pristiq 100 mg daily only.  Doing well at this.   Flu shot given today.     Subjective:  HPI:  See Assessment / plan for status of chronic conditions.  Patient here today for follow-up.  Seen 6 months ago for annual physical.  A1c at that time was 6.0.  We continued Mounjaro 7.5 mg weekly.  He is doing well with diet and exercise.  Doing well with Mounjaro.  No significant side effects.  He has noticed a little more urinary frequency recently. Wakes up 2-3 times to urinate at night. No dysuria. Symptoms about the same. No obvious precipitant no hesitancy or difficulty with pushing the urine through.  No hematuria.  He has also had some lightheadness for the last few days. No spinning sensation. No feeling like he was  going to pass out. Symptoms have been persistent the last few days. No fevers or chills. No weakness or numbness. No headache. Feels "foggy."  He is try to stay well-hydrated.       Objective:  Physical Exam: BP 125/74   Pulse 63   Temp (!) 97.5 F (36.4 C) (Temporal)   Ht 5\' 11"  (1.803 m)   Wt 206 lb 6.4 oz (93.6 kg)   SpO2 97%   BMI 28.79 kg/m   Wt Readings from Last 3 Encounters:  04/29/23 206 lb 6.4 oz (93.6 kg)  10/22/22 227 lb 6.4 oz (103.1 kg)  04/26/22 215 lb 6.4 oz (97.7 kg)    Gen: No acute distress, resting comfortably HEENT: TMs clear bilaterally. CV: Regular rate and rhythm with no murmurs appreciated Pulm: Normal work of breathing, clear to auscultation bilaterally with no crackles, wheezes, or rhonchi Neuro: Cranial nerves II through XII intact.  Strength 5 out of 5 in upper and lower extremities.  Sensation light touch intact throughout. Psych: Normal affect and thought content      Vandy Tsuchiya M. Jimmey Ralph, MD 04/29/2023 2:31 PM

## 2023-04-29 NOTE — Assessment & Plan Note (Signed)
A1c 5.1.  Doing well with Mounjaro.  Continue 7.5 mg weekly.  Recheck in 6 months at CPE.

## 2023-04-29 NOTE — Patient Instructions (Signed)
It was very nice to see you today!  Your sugar looks great today.  Please continue current dose of Mounjaro 7.5 mg weekly.  Will recheck in 6 months at your annual physical.  Your urinary frequency is likely due to enlarged prostate.  Will check urine sample and blood work to make sure there is nothing else that is going on.  If these tests are all negative we can discuss trial of medication versus referral to see urologist.  You may be having lightheadedness from a viral illness.  Your exam today is normal.  Will be checking labs as above as well.  There is also possible the Mounjaro could be contributing to this.  If this is something that persist for the next several days we can try going to a lower dose of Mounjaro to see if this helps.  Return in about 6 months (around 10/27/2023) for Annual Physical.   Take care, Dr Jimmey Ralph  PLEASE NOTE:  If you had any lab tests, please let us know if you have not heard back within a few days. You may see your results on mychart before we have a chance to review them but we will give you a call once they are reviewed by [place].   If we ordered any referrals today, please let us know if you have not heard from their office within the next week.   If you had any urgent prescriptions sent in today, please check with the pharmacy within an hour of our visit to make sure the prescription was transmitted appropriately.   Please try these tips to maintain a healthy lifestyle:  Eat at least 3 REAL meals and 1-2 snacks per day.  Aim for no more than 5 hours between eating.  If you eat breakfast, please do so within one hour of getting up.   Each meal should contain half fruits/vegetables, one quarter protein, and one quarter carbs (no bigger than a computer mouse)  Cut down on sweet beverages. This includes juice, soda, and sweet tea.   Drink at least 1 glass of water with each meal and aim for at least 8 glasses per day  Exercise at least 150 minutes every  week.

## 2023-04-30 ENCOUNTER — Other Ambulatory Visit: Payer: Self-pay | Admitting: Family Medicine

## 2023-04-30 LAB — COMPREHENSIVE METABOLIC PANEL
ALT: 40 U/L (ref 0–53)
AST: 31 U/L (ref 0–37)
Albumin: 4.4 g/dL (ref 3.5–5.2)
Alkaline Phosphatase: 78 U/L (ref 39–117)
BUN: 15 mg/dL (ref 6–23)
CO2: 30 meq/L (ref 19–32)
Calcium: 9.4 mg/dL (ref 8.4–10.5)
Chloride: 102 meq/L (ref 96–112)
Creatinine, Ser: 1.18 mg/dL (ref 0.40–1.50)
GFR: 69.44 mL/min (ref >60.00)
Glucose, Bld: 73 mg/dL (ref 70–99)
Potassium: 4.2 meq/L (ref 3.5–5.1)
Sodium: 139 meq/L (ref 135–145)
Total Bilirubin: 0.8 mg/dL (ref 0.2–1.2)
Total Protein: 6.6 g/dL (ref 6.0–8.3)

## 2023-04-30 LAB — CBC
HCT: 48.4 % (ref 39.0–52.0)
Hemoglobin: 16.1 g/dL (ref 13.0–17.0)
MCHC: 33.3 g/dL (ref 30.0–36.0)
MCV: 91 fl (ref 78.0–100.0)
Platelets: 211 10*3/uL (ref 150.0–400.0)
RBC: 5.32 Mil/uL (ref 4.22–5.81)
RDW: 13.8 % (ref 11.5–15.5)
WBC: 5.8 10*3/uL (ref 4.0–10.5)

## 2023-04-30 LAB — URINE CULTURE
MICRO NUMBER:: 15471382
Result:: NO GROWTH
SPECIMEN QUALITY:: ADEQUATE

## 2023-04-30 LAB — PSA: PSA: 1.72 ng/mL (ref 0.10–4.00)

## 2023-05-01 NOTE — Progress Notes (Signed)
Labs are all normal.  Urine testing is negative.  As we discussed at his office visit his urinary frequency is likely due to mild BPH.  We can try a medication to help with this or we can refer to urology for further evaluation.  He should let us know which he prefers.  All of his other labs were normal.  He should keep up the good work with diet and exercise and we can recheck everything in a year or so.

## 2023-05-02 ENCOUNTER — Other Ambulatory Visit: Payer: Self-pay | Admitting: *Deleted

## 2023-05-02 MED ORDER — TAMSULOSIN HCL 0.4 MG PO CAPS: 0.4000 mg | ORAL_CAPSULE | Freq: Every day | ORAL | 3 refills | Status: DC

## 2023-05-02 NOTE — Progress Notes (Signed)
Please send in Flomax 0.4 mg daily and have him follow up with [place] in a couple of weeks.  Katina Degree. Jimmey Ralph, MD 05/02/2023 7:27 AM

## 2023-05-20 ENCOUNTER — Other Ambulatory Visit: Payer: Self-pay | Admitting: Family Medicine

## 2023-06-19 LAB — HM DIABETES EYE EXAM

## 2023-07-18 ENCOUNTER — Other Ambulatory Visit: Payer: Self-pay | Admitting: Family Medicine

## 2023-07-28 ENCOUNTER — Other Ambulatory Visit: Payer: Self-pay | Admitting: Family Medicine

## 2023-09-16 NOTE — Patient Instructions (Signed)

## 2023-09-16 NOTE — Progress Notes (Signed)
   PATIENT: Ethan Osborn DOB: Nov 08, 1965  REASON FOR VISIT: follow up HISTORY FROM: patient  Virtual Visit via Telephone Note  I connected with Ethan Osborn on 09/18/23 at  9:30 AM EST by telephone and verified that I am speaking with the correct person using two identifiers.   I discussed the limitations, risks, security and privacy concerns of performing an evaluation and management service by telephone and the availability of in person appointments. I also discussed with the patient that there may be a patient responsible charge related to this service. The patient expressed understanding and agreed to proceed.   History of Present Illness:  09/18/23 ALL: Ethan Osborn returns for follow up for OSA on CPAP. He continues to do well on therapy. He is using therapy nightly for about 7.5 hours, on average. He denies concerns with machine or supplies. He is sleeping well. He is feeling well and without complaints.     09/25/2022 ALL:  Ethan Osborn is a 58 y.o. male here today for follow up for OSA on CPAP. He continues to do well. He is using CPAP nightly for about 7-8 hours. No concerns with machine or supplies.     09/21/21 ALL:  Ethan Osborn is a 58 y.o. male here today for follow up for OSA on CPAP.  His compliance report below demonstrates excellent compliance wih Osborn days used and for >4 hours. His apnea is well managed with an AHI of 0.9. He reports that he is doing great and has no complaints at this time.        Observations/Objective:  Generalized: Well developed, in no acute distress  Mentation: Alert oriented to time, place, history taking. Follows all commands speech and language fluent   Assessment and Plan:  58 y.o. year old male  has a past medical history of Allergy, Anxiety, Arthritis, and Sleep apnea. here with    ICD-10-CM   1. OSA on CPAP  G47.33 For home use only DME continuous positive airway pressure (CPAP)      Ethan Osborn is doing well. Compliance  report reveals excellent compliance. He was encouraged to continue CPAP nightly for at least 4 hours. Supply orders updated. Healthy lifestyle habits encouraged. He wlil follow up in 1 year.    Orders Placed This Encounter  Procedures   For home use only DME continuous positive airway pressure (CPAP)    Heated Humidity with all supplies as needed    Length of Need:   Lifetime    Patient has OSA or probable OSA:   Yes    Is the patient currently using CPAP in the home:   Yes    Settings:   Other see comments    CPAP supplies needed:   Mask, headgear, cushions, filters, heated tubing and water chamber    No orders of the defined types were placed in this encounter.   Follow Up Instructions:  I discussed the assessment and treatment plan with the patient. The patient was provided an opportunity to ask questions and all were answered. The patient agreed with the plan and demonstrated an understanding of the instructions.   The patient was advised to call back or seek an in-person evaluation if the symptoms worsen or if the condition fails to improve as anticipated.  I provided 15 minutes of non-face-to-face time during this encounter. Patient located in their parked car during Mychart visit. Provider is in the office.    Ethan Bari, NP

## 2023-09-17 ENCOUNTER — Telehealth: Payer: Self-pay

## 2023-09-17 NOTE — Progress Notes (Signed)
 [name] Kitchen

## 2023-09-17 NOTE — Telephone Encounter (Signed)
Please Read MyChart from today 09/17/2023

## 2023-09-18 ENCOUNTER — Encounter: Payer: Self-pay | Admitting: Family Medicine

## 2023-09-18 ENCOUNTER — Telehealth (INDEPENDENT_AMBULATORY_CARE_PROVIDER_SITE_OTHER): Payer: 59 | Admitting: Family Medicine

## 2023-09-18 DIAGNOSIS — G4733 Obstructive sleep apnea (adult) (pediatric): Secondary | ICD-10-CM | POA: Diagnosis not present

## 2023-09-30 ENCOUNTER — Telehealth: Payer: 59 | Admitting: Family Medicine

## 2023-10-23 ENCOUNTER — Ambulatory Visit: Payer: 59 | Admitting: Family Medicine

## 2023-10-23 ENCOUNTER — Encounter: Payer: Self-pay | Admitting: Family Medicine

## 2023-10-23 VITALS — BP 118/74 | HR 65 | Temp 97.3°F | Ht 71.0 in | Wt 197.2 lb

## 2023-10-23 DIAGNOSIS — Z7985 Long-term (current) use of injectable non-insulin antidiabetic drugs: Secondary | ICD-10-CM

## 2023-10-23 DIAGNOSIS — Z0001 Encounter for general adult medical examination with abnormal findings: Secondary | ICD-10-CM | POA: Diagnosis not present

## 2023-10-23 DIAGNOSIS — F332 Major depressive disorder, recurrent severe without psychotic features: Secondary | ICD-10-CM

## 2023-10-23 DIAGNOSIS — E1169 Type 2 diabetes mellitus with other specified complication: Secondary | ICD-10-CM | POA: Diagnosis not present

## 2023-10-23 DIAGNOSIS — Z125 Encounter for screening for malignant neoplasm of prostate: Secondary | ICD-10-CM | POA: Diagnosis not present

## 2023-10-23 DIAGNOSIS — R7989 Other specified abnormal findings of blood chemistry: Secondary | ICD-10-CM | POA: Diagnosis not present

## 2023-10-23 DIAGNOSIS — E785 Hyperlipidemia, unspecified: Secondary | ICD-10-CM | POA: Diagnosis not present

## 2023-10-23 DIAGNOSIS — E1165 Type 2 diabetes mellitus with hyperglycemia: Secondary | ICD-10-CM

## 2023-10-23 DIAGNOSIS — M1990 Unspecified osteoarthritis, unspecified site: Secondary | ICD-10-CM

## 2023-10-23 LAB — COMPREHENSIVE METABOLIC PANEL
ALT: 46 U/L (ref 0–53)
AST: 30 U/L (ref 0–37)
Albumin: 4.6 g/dL (ref 3.5–5.2)
Alkaline Phosphatase: 69 U/L (ref 39–117)
BUN: 20 mg/dL (ref 6–23)
CO2: 29 meq/L (ref 19–32)
Calcium: 9.5 mg/dL (ref 8.4–10.5)
Chloride: 103 meq/L (ref 96–112)
Creatinine, Ser: 1.14 mg/dL (ref 0.40–1.50)
GFR: 72.13 mL/min (ref >60.00)
Glucose, Bld: 89 mg/dL (ref 70–99)
Potassium: 4.3 meq/L (ref 3.5–5.1)
Sodium: 138 meq/L (ref 135–145)
Total Bilirubin: 0.9 mg/dL (ref 0.2–1.2)
Total Protein: 6.4 g/dL (ref 6.0–8.3)

## 2023-10-23 LAB — LIPID PANEL
Cholesterol: 116 mg/dL (ref 0–200)
HDL: 36.1 mg/dL — ABNORMAL LOW (ref >39.00)
LDL Cholesterol: 58 mg/dL (ref 0–99)
NonHDL: 79.9
Total CHOL/HDL Ratio: 3
Triglycerides: 109 mg/dL (ref 0.0–149.0)
VLDL: 21.8 mg/dL (ref 0.0–40.0)

## 2023-10-23 LAB — CBC
HCT: 49.2 % (ref 39.0–52.0)
Hemoglobin: 16.5 g/dL (ref 13.0–17.0)
MCHC: 33.5 g/dL (ref 30.0–36.0)
MCV: 91.1 fl (ref 78.0–100.0)
Platelets: 187 10*3/uL (ref 150.0–400.0)
RBC: 5.4 Mil/uL (ref 4.22–5.81)
RDW: 13.3 % (ref 11.5–15.5)
WBC: 5.9 10*3/uL (ref 4.0–10.5)

## 2023-10-23 LAB — TESTOSTERONE: Testosterone: 678.64 ng/dL (ref 300.00–890.00)

## 2023-10-23 LAB — PSA: PSA: 1.68 ng/mL (ref 0.10–4.00)

## 2023-10-23 LAB — VITAMIN D 25 HYDROXY (VIT D DEFICIENCY, FRACTURES): VITD: 35.28 ng/mL (ref 30.00–100.00)

## 2023-10-23 LAB — TSH: TSH: 2.64 u[IU]/mL (ref 0.35–5.50)

## 2023-10-23 LAB — HEMOGLOBIN A1C: Hgb A1c MFr Bld: 5.3 % (ref 4.6–6.5)

## 2023-10-23 NOTE — Patient Instructions (Signed)
 It was very nice to see you today!  We will check blood work and a urine sample today.  You are due for a colonoscopy this year.  Please let us know if you have not heard from the gastroenterology office about scheduling as soon.  You probably have a bit of arthritis in your knee.  Work on the exercises.  Also use ice and compression.  Let us know if not improving.  Will probably see back in 6 months to recheck your A1c.  Come back in a year for your annual physical.  Return in about 1 year (around 10/22/2024) for Annual Physical.   Take care, Dr Jimmey Ralph  PLEASE NOTE:  If you had any lab tests, please let us know if you have not heard back within a few days. You may see your results on mychart before we have a chance to review them but we will give you a call once they are reviewed by [place].   If we ordered any referrals today, please let us know if you have not heard from their office within the next week.   If you had any urgent prescriptions sent in today, please check with the pharmacy within an hour of our visit to make sure the prescription was transmitted appropriately.   Please try these tips to maintain a healthy lifestyle:  Eat at least 3 REAL meals and 1-2 snacks per day.  Aim for no more than 5 hours between eating.  If you eat breakfast, please do so within one hour of getting up.   Each meal should contain half fruits/vegetables, one quarter protein, and one quarter carbs (no bigger than a computer mouse)  Cut down on sweet beverages. This includes juice, soda, and sweet tea.   Drink at least 1 glass of water with each meal and aim for at least 8 glasses per day  Exercise at least 150 minutes every week.    Preventive Care 106-45 Years Old, Male Preventive care refers to lifestyle choices and visits with your health care provider that can promote health and wellness. Preventive care visits are also called wellness exams. What can I expect for my preventive care  visit? Counseling During your preventive care visit, your health care provider may ask about your: Medical history, including: Past medical problems. Family medical history. Current health, including: Emotional well-being. Home life and relationship well-being. Sexual activity. Lifestyle, including: Alcohol, nicotine or tobacco, and drug use. Access to firearms. Diet, exercise, and sleep habits. Safety issues such as seatbelt and bike helmet use. Sunscreen use. Work and work Astronomer. Physical exam Your health care provider will check your: Height and weight. These may be used to calculate your BMI (body mass index). BMI is a measurement that tells if you are at a healthy weight. Waist circumference. This measures the distance around your waistline. This measurement also tells if you are at a healthy weight and may help predict your risk of certain diseases, such as type 2 diabetes and high blood pressure. Heart rate and blood pressure. Body temperature. Skin for abnormal spots. What immunizations do I need?  Vaccines are usually given at various ages, according to a schedule. Your health care provider will recommend vaccines for you based on your age, medical history, and lifestyle or other factors, such as travel or where you work. What tests do I need? Screening Your health care provider may recommend screening tests for certain conditions. This may include: Lipid and cholesterol levels. Diabetes screening. This is  done by checking your blood sugar (glucose) after you have not eaten for a while (fasting). Hepatitis B test. Hepatitis C test. HIV (human immunodeficiency virus) test. STI (sexually transmitted infection) testing, if you are at risk. Lung cancer screening. Prostate cancer screening. Colorectal cancer screening. Talk with your health care provider about your test results, treatment options, and if necessary, the need for more tests. Follow these instructions  at home: Eating and drinking  Eat a diet that includes fresh fruits and vegetables, whole grains, lean protein, and low-fat dairy products. Take vitamin and mineral supplements as recommended by your health care provider. Do not drink alcohol if your health care provider tells you not to drink. If you drink alcohol: Limit how much you have to 0-2 drinks a day. Know how much alcohol is in your drink. In the [place] one drink equals one 12 oz bottle of beer (355 mL), one 5 oz glass of wine (148 mL), or one 1 oz glass of hard liquor (44 mL). Lifestyle Brush your teeth every morning and night with fluoride toothpaste. Floss one time each day. Exercise for at least 30 minutes 5 or more days each week. Do not use any products that contain nicotine or tobacco. These products include cigarettes, chewing tobacco, and vaping devices, such as e-cigarettes. If you need help quitting, ask your health care provider. Do not use drugs. If you are sexually active, practice safe sex. Use a condom or other form of protection to prevent STIs. Take aspirin only as told by your health care provider. Make sure that you understand how much to take and what form to take. Work with your health care provider to find out whether it is safe and beneficial for you to take aspirin daily. Find healthy ways to manage stress, such as: Meditation, yoga, or listening to music. Journaling. Talking to a trusted person. Spending time with friends and family. Minimize exposure to UV radiation to reduce your risk of skin cancer. Safety Always wear your seat belt while driving or riding in a vehicle. Do not drive: If you have been drinking alcohol. Do not ride with someone who has been drinking. When you are tired or distracted. While texting. If you have been using any mind-altering substances or drugs. Wear a helmet and other protective equipment during sports activities. If you have firearms in your house, make sure you  follow all gun safety procedures. What's next? Go to your health care provider once a year for an annual wellness visit. Ask your health care provider how often you should have your eyes and teeth checked. Stay up to date on all vaccines. This information is not intended to replace advice given to you by your health care provider. Make sure you discuss any questions you have with your health care provider. Document Revised: 01/25/2021 Document Reviewed: 01/25/2021 Elsevier Patient Education  2024 [employer].

## 2023-10-23 NOTE — Progress Notes (Signed)
 Chief Complaint:  Ethan Osborn is a 58 y.o. male who presents today for his annual comprehensive physical exam.    Assessment/Plan:  New/Acute Problems: Right Knee Pain  Overall reassuring exam.  Based on history and exam likely does have a little bit of underlying osteoarthritis.  May have some degenerative meniscal changes as well.  Currently symptoms are very mild and manageable.  We discussed conservative management including compression and ice.  Also discussed home exercise program and handout was given.  He will let us know if symptoms worsen or do not improve in the next few weeks and we can consider imaging versus referral to PT or sports medicine at that time.  We discussed reasons to return to care.  Chronic Problems Addressed Today: Low testosterone Stable on testosterone gel 2 pumps daily.  Check labs today.  Low vitamin D level Check vitamin D  MDD (major depressive disorder), recurrent episode, severe (HCC) Following with psychiatry.  Symptoms stable on Pristiq 50 mg daily.  Dyslipidemia associated with type 2 diabetes mellitus (HCC) Stable on Lipitor 10 mg daily.  Check labs.  Type 2 diabetes mellitus with hyperglycemia (HCC) Doing well with Mounjaro.  He is down about 30 pounds over the last year.  Congratulated patient on weight loss.  We will continue Mounjaro 7.5 mg weekly.  Check A1c today.  Arthritis Likely contributing to his above right knee pain.  We did discuss imaging and referral however declined at this time.  He will let us know if symptoms worsen.  Preventative Healthcare: Check labs.  Due for colonoscopy later this year.  Up-to-date on vaccines.  Patient Counseling(The following topics were reviewed and/or handout was given):  -Nutrition: Stressed importance of moderation in sodium/caffeine intake, saturated fat and cholesterol, caloric balance, sufficient intake of fresh fruits, vegetables, and fiber.  -Stressed the importance of regular  exercise.   -Substance Abuse: Discussed cessation/primary prevention of tobacco, alcohol, or other drug use; driving or other dangerous activities under the influence; availability of treatment for abuse.   -Injury prevention: Discussed safety belts, safety helmets, smoke detector, smoking near bedding or upholstery.   -Sexuality: Discussed sexually transmitted diseases, partner selection, use of condoms, avoidance of unintended pregnancy and contraceptive alternatives.   -Dental health: Discussed importance of regular tooth brushing, flossing, and dental visits.  -Health maintenance and immunizations reviewed. Please refer to Health maintenance section.  Return to care in 1 year for next preventative visit.     Subjective:  HPI:  He has no acute complaints today.  See A/P for status of chronic conditions.  He has been having some knee pain in right knee for the last month or so. Worse when going up stairs. Some stretching has helped. Tried OTC medications which help.No swelling.   Lifestyle Diet: Balanced. Plenty of fruits and vegetables.  Exercise: Gym 5 times per week. Mix of resistance and cardio.     10/23/2023    8:04 AM  Depression screen PHQ 2/9  Decreased Interest 0  Down, Depressed, Hopeless 0  PHQ - 2 Score 0    Health Maintenance Due  Topic Date Due   Diabetic kidney evaluation - Urine ACR  Never done   Hepatitis C Screening  Never done   FOOT EXAM  05/08/2022   Colonoscopy  10/15/2023     ROS: Per HPI, otherwise a complete review of systems was negative.   PMH:  The following were reviewed and entered/updated in epic: Past Medical History:  Diagnosis Date  Allergy    Anxiety    Arthritis    right big toe    Sleep apnea    wears cpap    Patient Active Problem List   Diagnosis Date Noted   Dyslipidemia associated with type 2 diabetes mellitus (HCC) 10/30/2019   Type 2 diabetes mellitus with hyperglycemia (HCC) 10/30/2019   MDD (major depressive  disorder), recurrent episode, severe (HCC) 10/14/2019   Sleep apnea    Arthritis    Allergy    Seborrheic keratoses 09/18/2018   Low testosterone 08/04/2018   Low vitamin D level 08/04/2018   Actinic keratosis 07/01/2018   Shoulder dislocation, right, initial encounter 03/10/2018   Rash 07/18/2017   GAD (generalized anxiety disorder) 01/08/2017   Past Surgical History:  Procedure Laterality Date   OPEN REDUCTION SHOULDER DISLOCATION     WISDOM TOOTH EXTRACTION      Family History  Problem Relation Age of Onset   Depression Mother    Hearing loss Mother    Colon polyps Mother    Arthritis Father    Diabetes Father    Hearing loss Father    Heart disease Father    Hyperlipidemia Father    Kidney disease Father    Colon polyps Father    Depression Sister    Stroke Maternal Grandfather    Cancer Paternal Grandmother    Cancer Paternal Grandfather    Colon cancer Neg Hx    Esophageal cancer Neg Hx    Stomach cancer Neg Hx    Rectal cancer Neg Hx     Medications- reviewed and updated Current Outpatient Medications  Medication Sig Dispense Refill   atorvastatin (LIPITOR) 10 MG tablet TAKE 1 TABLET DAILY AT 6 P.M. 90 tablet 3   blood glucose meter kit and supplies KIT Dispense based on patient and insurance preference. Use up to four times daily as directed. 1 each 0   desvenlafaxine (PRISTIQ) 50 MG 24 hr tablet Take 50 mg by mouth daily.     MOUNJARO 7.5 MG/0.5ML Pen INJECT 7.5 MG UNDER THE SKIN WEEKLY 6 mL 3   tamsulosin (FLOMAX) 0.4 MG CAPS capsule TAKE 1 CAPSULE BY MOUTH EVERY DAY 90 capsule 1   Testosterone 20.25 MG/ACT (1.62%) GEL 2 PUMPS ONCE DAILY 75 g 5   No current facility-administered medications for this visit.    Allergies-reviewed and updated Allergies  Allergen Reactions   Penicillins Hives    Shortness of breath    Social History   Socioeconomic History   Marital status: Married    Spouse name: Not on file   Number of children: Not on file    Years of education: Not on file   Highest education level: Not on file  Occupational History   Occupation: Doctor, general practice   Tobacco Use   Smoking status: Never   Smokeless tobacco: Never  Vaping Use   Vaping status: Never Used  Substance and Sexual Activity   Alcohol use: Not Currently    Comment: social    Drug use: Yes    Types: Marijuana    Comment: twice monthly   Sexual activity: Yes  Other Topics Concern   Not on file  Social History Narrative   Not on file   Social Drivers of Health   Financial Resource Strain: Not on file  Food Insecurity: Not on file  Transportation Needs: Not on file  Physical Activity: Not on file  Stress: Not on file  Social Connections: Unknown (12/24/2021)   [facility] from [facility]  [facility], [facility]   Social Network    Social Network: Not on file        Objective:  Physical Exam: BP 118/74   Pulse 65   Temp (!) 97.3 F (36.3 C)   Ht 5\' 11"  (1.803 m)   Wt 197 lb 3.2 oz (89.4 kg)   SpO2 95%   BMI 27.50 kg/m   Body mass index is 27.5 kg/m. Wt Readings from Last 3 Encounters:  10/23/23 197 lb 3.2 oz (89.4 kg)  04/29/23 206 lb 6.4 oz (93.6 kg)  10/22/22 227 lb 6.4 oz (103.1 kg)   Gen: NAD, resting comfortably HEENT: TMs normal bilaterally. OP clear. No thyromegaly noted.  CV: RRR with no murmurs appreciated Pulm: NWOB, CTAB with no crackles, wheezes, or rhonchi GI: Normal bowel sounds present. Soft, Nontender, Nondistended. MSK: no edema, cyanosis, or clubbing noted - Right Knee: No deformities.  No effusion.  Nontender to palpation.  Stable to varus and valgus stress.  Anterior and posterior drawer signs negative.  Crepitus noted with active and passive range of motion. Skin: warm, dry Neuro: CN2-12 grossly intact. Strength 5/5 in upper and lower extremities. Reflexes symmetric and intact bilaterally.  Psych: Normal affect and thought content     Amiya Escamilla M. Jimmey Ralph, MD 10/23/2023 8:27 AM

## 2023-10-23 NOTE — Assessment & Plan Note (Signed)
 Following with psychiatry.  Symptoms stable on Pristiq 50 mg daily.

## 2023-10-23 NOTE — Assessment & Plan Note (Signed)
 Check vitamin D.

## 2023-10-23 NOTE — Assessment & Plan Note (Signed)
 Likely contributing to his above right knee pain.  We did discuss imaging and referral however declined at this time.  He will let us know if symptoms worsen.

## 2023-10-23 NOTE — Assessment & Plan Note (Signed)
 Stable on testosterone gel 2 pumps daily.  Check labs today.

## 2023-10-23 NOTE — Assessment & Plan Note (Signed)
 Stable on Lipitor 10 mg daily.  Check labs.

## 2023-10-23 NOTE — Assessment & Plan Note (Signed)
 Doing well with Mounjaro.  He is down about 30 pounds over the last year.  Congratulated patient on weight loss.  We will continue Mounjaro 7.5 mg weekly.  Check A1c today.

## 2023-10-24 LAB — MICROALBUMIN / CREATININE URINE RATIO
Creatinine,U: 101.1 mg/dL
Microalb Creat Ratio: 6.9 mg/g (ref 0.0–30.0)
Microalb, Ur: <0.7 mg/dL

## 2023-10-25 ENCOUNTER — Encounter: Payer: Self-pay | Admitting: Family Medicine

## 2023-10-25 NOTE — Progress Notes (Signed)
 Labs are all at goal.  Do not need to make any changes to treatment plan at this time.  He should continue to work on diet and exercise and we can recheck everything in a year or so.

## 2024-01-22 ENCOUNTER — Other Ambulatory Visit: Payer: Self-pay | Admitting: Family Medicine

## 2024-01-26 ENCOUNTER — Other Ambulatory Visit: Payer: Self-pay | Admitting: Family Medicine

## 2024-03-21 ENCOUNTER — Other Ambulatory Visit: Payer: Self-pay | Admitting: Family Medicine

## 2024-04-28 ENCOUNTER — Other Ambulatory Visit

## 2024-04-30 ENCOUNTER — Other Ambulatory Visit

## 2024-05-04 ENCOUNTER — Ambulatory Visit: Admitting: Family Medicine

## 2024-05-04 VITALS — BP 119/69 | HR 57 | Temp 98.1°F | Ht 71.0 in | Wt 192.0 lb

## 2024-05-04 DIAGNOSIS — Z23 Encounter for immunization: Secondary | ICD-10-CM | POA: Diagnosis not present

## 2024-05-04 DIAGNOSIS — R399 Unspecified symptoms and signs involving the genitourinary system: Secondary | ICD-10-CM | POA: Diagnosis not present

## 2024-05-04 DIAGNOSIS — Z1211 Encounter for screening for malignant neoplasm of colon: Secondary | ICD-10-CM

## 2024-05-04 DIAGNOSIS — Z7985 Long-term (current) use of injectable non-insulin antidiabetic drugs: Secondary | ICD-10-CM | POA: Diagnosis not present

## 2024-05-04 DIAGNOSIS — E1165 Type 2 diabetes mellitus with hyperglycemia: Secondary | ICD-10-CM

## 2024-05-04 LAB — POCT GLYCOSYLATED HEMOGLOBIN (HGB A1C): Hemoglobin A1C: 4.9 % (ref 4.0–5.6)

## 2024-05-04 MED ORDER — MOUNJARO 7.5 MG/0.5ML ~~LOC~~ SOAJ: 7.5000 mg | SUBCUTANEOUS | 3 refills | Status: DC

## 2024-05-04 NOTE — Progress Notes (Signed)
   Ethan Osborn is a 58 y.o. male who presents today for an office visit.  Assessment/Plan:  Chronic Problems Addressed Today: Type 2 diabetes mellitus with hyperglycemia (HCC) A1c very well-controlled at 4.9.  Doing very well with Mounjaro  7.5 mg weekly without any significant side effects.  We did discuss decreasing the dose however he would still like to lose a few more pounds.  Will continue with 7.5 mg weekly.  Follow-up in 3 to 6 months.  Lower urinary tract symptoms (LUTS) Patient stopped the Flomax  over a month ago.  He has not noticed any change in symptoms since coming off the medication.  We discussed restarting or trial of another medication however symptoms are currently manageable.  We also did discuss referral to urology however he declined.  He will let us  know if he has any change or worsening symptoms.  Flu shot given today. Will refer for colonoscopy.  Come back in 6 months for CPE.    Subjective:  HPI:  See assessment / plan for status of chronic conditions.   Discussed the use of AI scribe software for clinical note transcription with the patient, who gave verbal consent to proceed.  History of Present Illness Ethan Osborn is a 58 year old male who presents for a six-month follow-up visit.  He was on a business trip and forgot to take his Flomax . Missing the medication did not alter his urinary symptoms. He continues to experience nocturia, waking up two to three times a night, but describes the symptoms as 'not terrible right now.' He has been off Flomax  since the trip.  He feels good overall and has lost about five pounds since his last visit. He is focused on maintaining a healthy diet, including plenty of fruits and vegetables, and is consistent with his exercise routine.  He is not currently checking his blood sugars at home, but his last A1c was 5.6, and he feels well with no adverse symptoms. He is also on Lipitor and testosterone .         Objective:   Physical Exam: BP 119/69   Pulse (!) 57   Temp 98.1 F (36.7 C) (Oral)   Ht 5' 11 (1.803 m)   Wt 192 lb (87.1 kg)   SpO2 97%   BMI 26.78 kg/m   Wt Readings from Last 3 Encounters:  05/04/24 192 lb (87.1 kg)  10/23/23 197 lb 3.2 oz (89.4 kg)  04/29/23 206 lb 6.4 oz (93.6 kg)    Gen: No acute distress, resting comfortably CV: Regular rate and rhythm with no murmurs appreciated Pulm: Normal work of breathing, clear to auscultation bilaterally with no crackles, wheezes, or rhonchi Neuro: Grossly normal, moves all extremities Psych: Normal affect and thought content      Molley Houser M. Kennyth, MD 05/04/2024 7:44 AM

## 2024-05-04 NOTE — Assessment & Plan Note (Signed)
 A1c very well-controlled at 4.9.  Doing very well with Mounjaro  7.5 mg weekly without any significant side effects.  We did discuss decreasing the dose however he would still like to lose a few more pounds.  Will continue with 7.5 mg weekly.  Follow-up in 3 to 6 months.

## 2024-05-04 NOTE — Patient Instructions (Signed)
 It was very nice to see you today!  VISIT SUMMARY: During your six-month follow-up visit, we discussed your ongoing health issues and made some adjustments to your treatment plan. You reported manageable urinary symptoms despite stopping Flomax , and you have lost five pounds since your last visit. Your prediabetes is well-controlled, and your testosterone  levels are stable.  YOUR PLAN: BENIGN PROSTATIC HYPERPLASIA (BPH): You have manageable urinary symptoms related to BPH, even after stopping Flomax . -Monitor your urinary symptoms. If they worsen, we may consider restarting Flomax . -If symptoms become unmanageable, we may refer you to a urologist.  OBESITY: You have lost 5 pounds and are focused on a healthy diet and exercise. -Continue your current diet and exercise regimen to promote further weight loss.  Diabetes: Your Diabetes is well-controlled with an improved A1c level. -Continue your current management plan. -Start taking Mounjaro  as prescribed.  GENERAL HEALTH MAINTENANCE: Routine health maintenance was discussed. -You received a flu shot today. -We will arrange a referral for a colonoscopy.  Return in about 6 months (around 11/01/2024) for Annual Physical.   Take care, Dr Kennyth  PLEASE NOTE:  If you had any lab tests, please let us  know if you have not heard back within a few days. You may see your results on mychart before we have a chance to review them but we will give you a call once they are reviewed by us .   If we ordered any referrals today, please let us  know if you have not heard from their office within the next week.   If you had any urgent prescriptions sent in today, please check with the pharmacy within an hour of our visit to make sure the prescription was transmitted appropriately.   Please try these tips to maintain a healthy lifestyle:  Eat at least 3 REAL meals and 1-2 snacks per day.  Aim for no more than 5 hours between eating.  If you eat breakfast,  please do so within one hour of getting up.   Each meal should contain half fruits/vegetables, one quarter protein, and one quarter carbs (no bigger than a computer mouse)  Cut down on sweet beverages. This includes juice, soda, and sweet tea.   Drink at least 1 glass of water with each meal and aim for at least 8 glasses per day  Exercise at least 150 minutes every week.

## 2024-05-04 NOTE — Assessment & Plan Note (Signed)
 Patient stopped the Flomax  over a month ago.  He has not noticed any change in symptoms since coming off the medication.  We discussed restarting or trial of another medication however symptoms are currently manageable.  We also did discuss referral to urology however he declined.  He will let us  know if he has any change or worsening symptoms.

## 2024-05-05 ENCOUNTER — Other Ambulatory Visit: Payer: Self-pay | Admitting: Family Medicine

## 2024-05-06 ENCOUNTER — Ambulatory Visit: Payer: Self-pay

## 2024-05-06 NOTE — Telephone Encounter (Signed)
 FYI Only or Action Required?: FYI only for provider. Scheduled for an acute appointment tomorrow at 1:40 PM with PCP  Patient was last seen in primary care on 05/04/2024 by Kennyth Worth HERO, MD.  Called Nurse Triage reporting Hand Pain.  Symptoms began today.  Interventions attempted: Rest, hydration, or home remedies and Ice/heat application.  Symptoms are: unchanged.  Triage Disposition: See PCP When Office is Open (Within 3 Days)  Patient/caregiver understands and will follow disposition?: Yes  Copied from CRM #[id number]. Topic: Clinical - Red Word Triage >> May 06, 2024  8:39 AM Ethan Osborn wrote: Red Word that prompted transfer to Nurse Triage: Patient believes to have broken his left hand ring ringer this morning, swelling and bruising around the area. Reason for Disposition  [1] MODERATE pain (e.g., interferes with normal activities) AND [2] present > 3 days  Answer Assessment - Initial Assessment Questions 1. ONSET: When did the pain start?      Patient states he was working out this morning and smashed his finger.  2. LOCATION and RADIATION: Where is the pain located?  (e.g., fingertip, around nail, joint, entire      Left hand ring finger-top of finger involving the top joint of finger 3. SEVERITY: How bad is the pain? What does it keep you from doing?   (Scale 1-10; or mild, moderate, severe)     3 out of 10 4. APPEARANCE: What does the finger look like? (e.g., redness, swelling, bruising, pallor)     Bruising and small amount of swelling per patient.  5. WORK OR EXERCISE: Has there been any recent work or exercise that involved this part (i.e., fingers or hand) of the body?     Was working out this morning 6. CAUSE: What do you think is causing the pain?     Patient was hold two weights on his knees and one rolled and smashed his left finger.  7. AGGRAVATING FACTORS: What makes the pain worse? (e.g., using computer)     Some pain with moving the finger from  side to side.  8. OTHER SYMPTOMS: Do you have any other symptoms? (e.g., fever, neck pain, numbness)     no  Protocols used: Finger Pain-A-AH

## 2024-05-06 NOTE — Telephone Encounter (Signed)
 Noted

## 2024-05-07 ENCOUNTER — Ambulatory Visit: Payer: Self-pay | Admitting: Family Medicine

## 2024-05-07 ENCOUNTER — Ambulatory Visit: Admitting: Family Medicine

## 2024-05-07 ENCOUNTER — Encounter: Payer: Self-pay | Admitting: Family Medicine

## 2024-05-07 ENCOUNTER — Ambulatory Visit (INDEPENDENT_AMBULATORY_CARE_PROVIDER_SITE_OTHER)

## 2024-05-07 VITALS — BP 120/69 | HR 58 | Temp 97.7°F | Ht 71.0 in | Wt 193.0 lb

## 2024-05-07 DIAGNOSIS — E1165 Type 2 diabetes mellitus with hyperglycemia: Secondary | ICD-10-CM

## 2024-05-07 DIAGNOSIS — Z7985 Long-term (current) use of injectable non-insulin antidiabetic drugs: Secondary | ICD-10-CM

## 2024-05-07 DIAGNOSIS — M79645 Pain in left finger(s): Secondary | ICD-10-CM

## 2024-05-07 MED ORDER — MOUNJARO 7.5 MG/0.5ML ~~LOC~~ SOAJ: 7.5000 mg | SUBCUTANEOUS | 3 refills | Status: DC

## 2024-05-07 NOTE — Patient Instructions (Addendum)
 It was very nice to see you today!  VISIT SUMMARY: You visited us  today due to an injury to your left fingers caused by a 60-pound dumbbell during a workout. We discussed the potential for a fracture or contusion and planned the next steps for diagnosis and treatment.  YOUR PLAN: LEFT FINGER INJURY (SUSPECTED FRACTURE VS CONTUSION): You injured your left fingers with a 60-pound dumbbell, resulting in bruising and swelling. We need to determine if it's a fracture or a contusion. -Get an x-ray of your left finger to check for a fracture. -If a fracture is confirmed, we will provide a finger splint. -Rest your finger and avoid putting pressure on it until we have the x-ray results. -If it is a fracture, expect a healing time of 4-6 weeks.  Return if symptoms worsen or fail to improve.   Take care, Dr Kennyth  PLEASE NOTE:  If you had any lab tests, please let us  know if you have not heard back within a few days. You may see your results on mychart before we have a chance to review them but we will give you a call once they are reviewed by us .   If we ordered any referrals today, please let us  know if you have not heard from their office within the next week.   If you had any urgent prescriptions sent in today, please check with the pharmacy within an hour of our visit to make sure the prescription was transmitted appropriately.   Please try these tips to maintain a healthy lifestyle:  Eat at least 3 REAL meals and 1-2 snacks per day.  Aim for no more than 5 hours between eating.  If you eat breakfast, please do so within one hour of getting up.   Each meal should contain half fruits/vegetables, one quarter protein, and one quarter carbs (no bigger than a computer mouse)  Cut down on sweet beverages. This includes juice, soda, and sweet tea.   Drink at least 1 glass of water with each meal and aim for at least 8 glasses per day  Exercise at least 150 minutes every week.

## 2024-05-07 NOTE — Assessment & Plan Note (Signed)
 Last A1c well-controlled at 4.9.  He is on Mounjaro  7.5 mg weekly.  Had an issue with getting this at the pharmacy but this should be resolved.

## 2024-05-07 NOTE — Progress Notes (Signed)
 Good news!  Radiology did not see a fracture on his x-ray.  He can use the splint as needed for comfort however this should improve over the next few days.  He should let us  know if pain does not improve.

## 2024-05-07 NOTE — Progress Notes (Signed)
   Ethan Osborn is a 58 y.o. male who presents today for an office visit.  Assessment/Plan:  New/Acute Problems: Left ring finger pain Concern for fracture.  Will obtain plain film today.  Finger splint was placed today.  Depending on results of x-ray may need referral to orthopedics if displaced fracture.  If nondisplaced fracture he can follow-up here in a couple of weeks for repeat exam and xray.  If negative for fracture he can follow-up as needed. He will use OTC pain medications as needed for pain control.  Chronic Problems Addressed Today: Type 2 diabetes mellitus with hyperglycemia (HCC) Last A1c well-controlled at 4.9.  He is on Mounjaro  7.5 mg weekly.  Had an issue with getting this at the pharmacy but this should be resolved.     Subjective:  HPI:  See assessment / plan for status of chronic conditions.   Discussed the use of AI scribe software for clinical note transcription with the patient, who gave verbal consent to proceed.  History of Present Illness Ethan Osborn is a 58 year old male who presents with a left finger injury sustained during a workout.  He sustained an injury to his left fingers while performing calf raises with a 60-pound dumbbell yesterday morning. The dumbbell slipped and fell onto his fingers, causing immediate bruising and swelling. He describes the impact as forceful, stating 'I felt it. I heard it.'  He applied ice to the affected area after the injury, which was provided by his workplace, and has not used any other treatments. The pain is manageable, and he is currently taking ibuprofen  for pain relief. He reports that he can move his fingers, but cannot put pressure on the injured area.  No other issues are reported, and everything else is good. He has swelling in the injured area but it is manageable.         Objective:  Physical Exam: BP 120/69   Pulse (!) 58   Temp 97.7 F (36.5 C) (Temporal)   Ht 5' 11 (1.803 m)   Wt 193 lb  (87.5 kg)   SpO2 98%   BMI 26.92 kg/m   Gen: No acute distress, resting comfortably CV: Regular rate and rhythm with no murmurs appreciated Pulm: Normal work of breathing, clear to auscultation bilaterally with no crackles, wheezes, or rhonchi MUSCULOSKELETAL: Left fourth digit with significant ecchymosis along volar aspect of middle phalanx.  Neurovascular intact distally.  Minimal swelling. Neuro: Grossly normal, moves all extremities Psych: Normal affect and thought content      Ethan Osborn M. Kennyth, MD 05/07/2024 2:09 PM

## 2024-05-08 ENCOUNTER — Encounter: Payer: Self-pay | Admitting: Gastroenterology

## 2024-05-11 ENCOUNTER — Other Ambulatory Visit: Payer: Self-pay | Admitting: *Deleted

## 2024-05-12 ENCOUNTER — Other Ambulatory Visit: Payer: Self-pay | Admitting: Family Medicine

## 2024-05-12 ENCOUNTER — Encounter: Payer: Self-pay | Admitting: Family Medicine

## 2024-05-14 ENCOUNTER — Other Ambulatory Visit: Payer: Self-pay | Admitting: *Deleted

## 2024-05-14 MED ORDER — MOUNJARO 7.5 MG/0.5ML ~~LOC~~ SOAJ: 7.5000 mg | SUBCUTANEOUS | 3 refills | Status: DC

## 2024-05-14 NOTE — Telephone Encounter (Signed)
 Hi Mr Ethan Osborn  I re-send this prescription to [facility] today  [facility]

## 2024-05-18 ENCOUNTER — Other Ambulatory Visit (HOSPITAL_COMMUNITY): Payer: Self-pay

## 2024-05-18 ENCOUNTER — Telehealth: Payer: Self-pay | Admitting: Pharmacy Technician

## 2024-05-18 NOTE — Telephone Encounter (Signed)
 Pharmacy Patient Advocate Encounter   Received notification from CoverMyMeds that prior authorization for Mounjaro  7.5MG /0.5ML auto-injectors  is required/requested.   Insurance verification completed.   The patient is insured through [facility].   Per test claim: PA required; PA submitted to above mentioned insurance via Latent Key/confirmation #/EOC [id number] Status is pending

## 2024-05-18 NOTE — Telephone Encounter (Signed)
 Pharmacy Patient Advocate Encounter  Received notification from [employer] that Prior Authorization for Mounjaro  7.5MG /0.5ML auto-injectors  has been APPROVED from 05/18/24 to 05/18/25. Ran test claim, Copay is $30.00. This test claim was processed through [facility]- copay amounts may vary at other pharmacies due to pharmacy/plan contracts, or as the patient moves through the different stages of their insurance plan.   PA #/Case ID/Reference #: [id number]

## 2024-05-19 ENCOUNTER — Other Ambulatory Visit (HOSPITAL_COMMUNITY): Payer: Self-pay

## 2024-05-19 NOTE — Telephone Encounter (Signed)
 Please start new PA Rx Mounjaro  New insurance information attached to note

## 2024-05-22 ENCOUNTER — Other Ambulatory Visit (HOSPITAL_COMMUNITY): Payer: Self-pay

## 2024-05-22 ENCOUNTER — Telehealth: Payer: Self-pay

## 2024-05-22 NOTE — Telephone Encounter (Signed)
 Pharmacy Patient Advocate Encounter   Received notification from CoverMyMeds that prior authorization for Testosterone  1.62% gel  is required/requested.   Insurance verification completed.   The patient is insured through [facility].   Per test claim: PA required; PA submitted to above mentioned insurance via Latent Key/confirmation #/EOC [facility] Status is pending

## 2024-05-28 NOTE — Telephone Encounter (Signed)
 Left message to return call to our office at their convenience.

## 2024-05-28 NOTE — Telephone Encounter (Signed)
 Pharmacy Patient Advocate Encounter  Received notification from [employer] that Prior Authorization for Testosterone  1.62% gel   has been DENIED.  See denial reason below. No denial letter attached in CMM. Will attach denial letter to Media tab once received.    PA #/Case ID/Reference #: [id number] THE REQUEST FOR COVERAGE FOR TESTOSTERONE  GEL 1.62%, USE AS DIRECTED (75 PER MONTH), IS DENIED. This decision is based on health plan criteria for TESTOSTERONE  GEL 1.62%. This medicine is covered only if: One of the following: (I) You have 2 pre-treatment serum total testosterone  levels less than 300ng/dL (less than 89.5 nmol/ L) or less than the reference range for the lab, taken at separate times (this may require treatment to be [id number] temporarily held. Document lab value and date for both levels). (II)You have 1 pre-treatment calculated free or bioavailable testosterone  level less than 50pg/ml (less than 5ng/dL or 9.82 nmol/L) or less than the reference range for the lab (this may require treatment to be temporarily held. Document lab value and date). The information provided does not show that you meet the criteria listed above.

## 2024-05-28 NOTE — Telephone Encounter (Signed)
 Patient notified rx testosterone  denied by insurance  Advise to call insurance for alternative

## 2024-05-29 ENCOUNTER — Encounter: Payer: Self-pay | Admitting: Family Medicine

## 2024-06-01 NOTE — Telephone Encounter (Signed)
 Please send an appeal  Thanks

## 2024-06-02 ENCOUNTER — Other Ambulatory Visit (HOSPITAL_COMMUNITY): Payer: Self-pay

## 2024-06-02 NOTE — Telephone Encounter (Signed)
 Good morning Devere, please take a look when you have a moment. I'm helping out [place] today since she's out.

## 2024-06-03 ENCOUNTER — Telehealth: Payer: Self-pay | Admitting: Pharmacist

## 2024-06-03 NOTE — Telephone Encounter (Signed)
 Insurance has approved Testosterone  1.62% gel through 06/02/2025.  Thank you, Devere Pandy, PharmD Clinical Pharmacist  [place]  Direct Dial: [phone]

## 2024-06-05 NOTE — Telephone Encounter (Signed)
 Patient aware Insurance has approved Testosterone  1.62% gel through 06/02/2025.

## 2024-07-14 ENCOUNTER — Ambulatory Visit

## 2024-07-14 VITALS — Ht 71.0 in | Wt 185.0 lb

## 2024-07-14 DIAGNOSIS — Z860100 Personal history of colon polyps, unspecified: Secondary | ICD-10-CM

## 2024-07-14 MED ORDER — NA SULFATE-K SULFATE-MG SULF 17.5-3.13-1.6 GM/177ML PO SOLN: 1.0000 | Freq: Once | ORAL | 0 refills | Status: AC

## 2024-07-14 NOTE — Progress Notes (Signed)

## 2024-07-15 ENCOUNTER — Encounter: Payer: Self-pay | Admitting: Gastroenterology

## 2024-07-21 ENCOUNTER — Encounter: Payer: Self-pay | Admitting: Gastroenterology

## 2024-07-28 ENCOUNTER — Encounter: Payer: Self-pay | Admitting: Gastroenterology

## 2024-07-28 ENCOUNTER — Ambulatory Visit: Admitting: Gastroenterology

## 2024-07-28 VITALS — BP 90/51 | HR 56 | Temp 97.0°F | Resp 10 | Ht 71.0 in | Wt 185.0 lb

## 2024-07-28 DIAGNOSIS — D122 Benign neoplasm of ascending colon: Secondary | ICD-10-CM

## 2024-07-28 DIAGNOSIS — D123 Benign neoplasm of transverse colon: Secondary | ICD-10-CM

## 2024-07-28 DIAGNOSIS — Z860100 Personal history of colon polyps, unspecified: Secondary | ICD-10-CM

## 2024-07-28 MED ORDER — SODIUM CHLORIDE 0.9 % IV SOLN: 500.0000 mL | Freq: Once | INTRAVENOUS | Status: DC

## 2024-07-28 NOTE — Patient Instructions (Signed)

## 2024-07-28 NOTE — Op Note (Signed)
 [facility] Patient Name: Ethan Osborn Procedure Date: 07/28/2024 8:17 AM MRN: [id number] Endoscopist: Elspeth P. Leigh , MD, [id number] Age: 58 Referring MD:  Date of Birth: 04/07/1966 Gender: Male Account #: [id number] Procedure:                Colonoscopy Indications:              High risk colon cancer surveillance: Personal                            history of colonic polyps - 3 polyps removed 10/2018 Medicines:                Monitored Anesthesia Care Procedure:                Pre-Anesthesia Assessment:                           - Prior to the procedure, a History and Physical                            was performed, and patient medications and                            allergies were reviewed. The patient's tolerance of                            previous anesthesia was also reviewed. The risks                            and benefits of the procedure and the sedation                            options and risks were discussed with the patient.                            All questions were answered, and informed consent                            was obtained. Prior Anticoagulants: The patient has                            taken no anticoagulant or antiplatelet agents. ASA                            Grade Assessment: III - A patient with severe                            systemic disease. After reviewing the risks and                            benefits, the patient was deemed in satisfactory                            condition to undergo the procedure.  After obtaining informed consent, the colonoscope                            was passed under direct vision. Throughout the                            procedure, the patient's blood pressure, pulse, and                            oxygen saturations were monitored continuously. The                            Olympus CF-HQ190L ([id number]) Colonoscope was                             introduced through the anus and advanced to the the                            cecum, identified by appendiceal orifice and                            ileocecal valve. The colonoscopy was performed                            without difficulty. The patient tolerated the                            procedure well. The quality of the bowel                            preparation was good. The ileocecal valve,                            appendiceal orifice, and rectum were photographed. Scope In: 8:32:14 AM Scope Out: 8:52:10 AM Scope Withdrawal Time: 0 hours 17 minutes 34 seconds  Total Procedure Duration: 0 hours 19 minutes 56 seconds  Findings:                 The perianal and digital rectal examinations were                            normal.                           A 2 mm polyp was found in the ascending colon. The                            polyp was sessile. The polyp was removed with a                            cold snare. Resection and retrieval were complete.                           Three sessile polyps were found in the transverse  colon. The polyps were 2 to 4 mm in size. These                            polyps were removed with a cold snare. Resection                            and retrieval were complete.                           A few small-mouthed diverticula were found in the                            entire colon.                           Internal hemorrhoids were found during retroflexion.                           The exam was otherwise without abnormality. Complications:            No immediate complications. Estimated blood loss:                            Minimal. Estimated Blood Loss:     Estimated blood loss was minimal. Impression:               - One 2 mm polyp in the ascending colon, removed                            with a cold snare. Resected and retrieved.                           - Three 2 to 4 mm polyps in the transverse  colon,                            removed with a cold snare. Resected and retrieved.                           - Diverticulosis in the entire examined colon.                           - Internal hemorrhoids.                           - The examination was otherwise normal. Recommendation:           - Patient has a contact number available for                            emergencies. The signs and symptoms of potential                            delayed complications were discussed with the  patient. Return to normal activities tomorrow.                            Written discharge instructions were provided to the                            patient.                           - Resume previous diet.                           - Continue present medications.                           - Await pathology results. Elspeth P. Aki Burdin, MD 07/28/2024 8:57:19 AM This report has been signed electronically.

## 2024-07-28 NOTE — Progress Notes (Signed)
 Vss nad trans to pacu

## 2024-07-28 NOTE — Progress Notes (Signed)
 Pt's states no medical or surgical changes since previsit or office visit.

## 2024-07-28 NOTE — Progress Notes (Signed)
 [facility] History and Physical   Primary Care Physician:  Kennyth Worth HERO, MD   Reason for Procedure:   History of colon polyps  Plan:    colonoscopy     HPI: Ethan Osborn is a 58 y.o. male  here for colonoscopy surveillance - last exam 10/2018 - 3 adenomas.  Patient denies any bowel symptoms at this time. No family history of colon cancer known. Otherwise feels well without any cardiopulmonary symptoms.   I have discussed risks / benefits of anesthesia and endoscopic procedure with [name] and they wish to proceed with the exams as outlined today.   The patient was provided an opportunity to ask questions and all were answered. The patient agreed with the plan.    Past Medical History:  Diagnosis Date   Allergy    Anxiety    Arthritis    right big toe    Hyperlipidemia    Sleep apnea    wears cpap     Past Surgical History:  Procedure Laterality Date   OPEN REDUCTION SHOULDER DISLOCATION     WISDOM TOOTH EXTRACTION      Prior to Admission medications  Medication Sig Start Date End Date Taking? Authorizing Provider  atorvastatin  (LIPITOR) 10 MG tablet TAKE 1 TABLET DAILY AT 6 P.M. 05/12/24  Yes Kennyth Worth HERO, MD  Multiple Vitamin (MULTIVITAMIN) tablet Take 1 tablet by mouth daily.   Yes [provider]  Testosterone  20.25 MG/ACT (1.62%) GEL 2 PUMPS ONCE DAILY 03/23/24  Yes Kennyth Worth HERO, MD  blood glucose meter kit and supplies KIT Dispense based on patient and insurance preference. Use up to four times daily as directed. 04/26/22   Kennyth Worth HERO, MD  tirzepatide  (MOUNJARO ) 7.5 MG/0.5ML Pen Inject 7.5 mg into the skin once a week. 05/14/24   Kennyth Worth HERO, MD    Current Outpatient Medications  Medication Sig Dispense Refill   atorvastatin  (LIPITOR) 10 MG tablet TAKE 1 TABLET DAILY AT 6 P.M. 90 tablet 3   Multiple Vitamin (MULTIVITAMIN) tablet Take 1 tablet by mouth daily.     Testosterone  20.25 MG/ACT (1.62%) GEL 2 PUMPS ONCE  DAILY 75 g 5   blood glucose meter kit and supplies KIT Dispense based on patient and insurance preference. Use up to four times daily as directed. 1 each 0   tirzepatide  (MOUNJARO ) 7.5 MG/0.5ML Pen Inject 7.5 mg into the skin once a week. 6 mL 3   Current Facility-Administered Medications  Medication Dose Route Frequency Provider Last Rate Last Admin   0.9 %  sodium chloride  infusion  500 mL Intravenous Once Katana Berthold, Elspeth SQUIBB, MD        Allergies as of 07/28/2024 - Review Complete 07/28/2024  Allergen Reaction Noted   Penicillins Hives 12/11/2011    Family History  Problem Relation Age of Onset   Depression Mother    Hearing loss Mother    Colon polyps Mother    Arthritis Father    Diabetes Father    Hearing loss Father    Heart disease Father    Hyperlipidemia Father    Kidney disease Father    Colon polyps Father    Depression Sister    Stroke Maternal Grandfather    Cancer Paternal Grandmother    Cancer Paternal Grandfather    Colon cancer Neg Hx    Esophageal cancer Neg Hx    Stomach cancer Neg Hx    Rectal cancer Neg Hx     Social History  Socioeconomic History   Marital status: Married    Spouse name: Not on file   Number of children: Not on file   Years of education: Not on file   Highest education level: Bachelor's degree (e.g., BA, AB, BS)  Occupational History   Occupation: Doctor, General Practice   Tobacco Use   Smoking status: Never   Smokeless tobacco: Never  Vaping Use   Vaping status: Never Used  Substance and Sexual Activity   Alcohol use: Yes    Comment: social    Drug use: Not Currently    Types: Marijuana    Comment: twice monthly   Sexual activity: Yes  Other Topics Concern   Not on file  Social History Narrative   Not on file   Social Drivers of Health   Tobacco Use: Low Risk (07/28/2024)   Patient History    Smoking Tobacco Use: Never    Smokeless Tobacco Use: Never    Passive Exposure: Not on file  Financial Resource  Strain: Low Risk (05/01/2024)   Overall Financial Resource Strain (CARDIA)    Difficulty of Paying Living Expenses: Not very hard  Food Insecurity: No Food Insecurity (05/01/2024)   Epic    Worried About Programme Researcher, Broadcasting/film/video in the Last Year: Never true    Ran Out of Food in the Last Year: Never true  Transportation Needs: No Transportation Needs (05/01/2024)   Epic    Lack of Transportation (Medical): No    Lack of Transportation (Non-Medical): No  Physical Activity: Sufficiently Active (05/01/2024)   Exercise Vital Sign    Days of Exercise per Week: 5 days    Minutes of Exercise per Session: 50 min  Stress: No Stress Concern Present (05/01/2024)   [employer] of Occupational Health - Occupational Stress Questionnaire    Feeling of Stress: Not at all  Social Connections: Moderately Integrated (05/01/2024)   Social Connection and Isolation Panel    Frequency of Communication with Friends and Family: More than three times a week    Frequency of Social Gatherings with Friends and Family: Twice a week    Attends Religious Services: 1 to 4 times per year    Active Member of [employer] or Organizations: No    Attends Engineer, Structural: Not on file    Marital Status: Married  Intimate Partner Violence: Unknown (11/15/2021)   Received from [facility]   HITS    Physically Hurt: Not on file    Insult or Talk Down To: Not on file    Threaten Physical Harm: Not on file    Scream or Curse: Not on file  Depression (PHQ2-9): Low Risk (05/04/2024)   Depression (PHQ2-9)    PHQ-2 Score: 0  Alcohol Screen: Low Risk (05/01/2024)   Alcohol Screen    Last Alcohol Screening Score (AUDIT): 1  Housing: Low Risk (05/01/2024)   Epic    Unable to Pay for Housing in the Last Year: No    Number of Times Moved in the Last Year: 0    Homeless in the Last Year: No  Utilities: Not on file  Health Literacy: Not on file    Review of Systems: All other review of systems negative except as mentioned  in the HPI.  Physical Exam: Vital signs BP 134/83   Pulse 62   Temp (!) 97 F (36.1 C)   Ht 5' 11 (1.803 m)   Wt 185 lb (83.9 kg)   SpO2 97%   BMI 25.80 kg/m  General:   Alert,  Well-developed, pleasant and cooperative in NAD Lungs:  Clear throughout to auscultation.   Heart:  Regular rate and rhythm Abdomen:  Soft, nontender and nondistended.   Neuro/Psych:  Alert and cooperative. Normal mood and affect. A and O x 3  Marcey Naval, MD [facility]

## 2024-07-28 NOTE — Progress Notes (Signed)
 Called to room to assist during endoscopic procedure.  Patient ID and intended procedure confirmed with present staff. Received instructions for my participation in the procedure from the performing physician.

## 2024-07-29 ENCOUNTER — Telehealth: Payer: Self-pay | Admitting: Lactation Services

## 2024-07-29 NOTE — Telephone Encounter (Signed)
 No answer -no voice mail set up.

## 2024-07-30 ENCOUNTER — Ambulatory Visit: Payer: Self-pay | Admitting: Gastroenterology

## 2024-07-30 LAB — SURGICAL PATHOLOGY

## 2024-08-28 ENCOUNTER — Ambulatory Visit: Admitting: Family Medicine

## 2024-08-28 ENCOUNTER — Ambulatory Visit: Payer: Self-pay | Admitting: Family Medicine

## 2024-08-28 ENCOUNTER — Ambulatory Visit: Payer: Self-pay

## 2024-08-28 VITALS — BP 116/78 | HR 60 | Temp 97.6°F | Ht 71.0 in | Wt 194.2 lb

## 2024-08-28 DIAGNOSIS — R197 Diarrhea, unspecified: Secondary | ICD-10-CM | POA: Diagnosis not present

## 2024-08-28 DIAGNOSIS — R1085 Abdominal pain of multiple sites: Secondary | ICD-10-CM

## 2024-08-28 DIAGNOSIS — Z8719 Personal history of other diseases of the digestive system: Secondary | ICD-10-CM

## 2024-08-28 LAB — CBC WITH DIFFERENTIAL/PLATELET
Basophils Absolute: 0 K/uL (ref 0.0–0.1)
Basophils Relative: 0.2 % (ref 0.0–3.0)
Eosinophils Absolute: 0.3 K/uL (ref 0.0–0.7)
Eosinophils Relative: 3.1 % (ref 0.0–5.0)
HCT: 47.9 % (ref 39.0–52.0)
Hemoglobin: 16.6 g/dL (ref 13.0–17.0)
Lymphocytes Relative: 22.8 % (ref 12.0–46.0)
Lymphs Abs: 1.9 K/uL (ref 0.7–4.0)
MCHC: 34.7 g/dL (ref 30.0–36.0)
MCV: 88.9 fl (ref 78.0–100.0)
Monocytes Absolute: 0.7 K/uL (ref 0.1–1.0)
Monocytes Relative: 8.1 % (ref 3.0–12.0)
Neutro Abs: 5.4 K/uL (ref 1.4–7.7)
Neutrophils Relative %: 65.8 % (ref 43.0–77.0)
Platelets: 213 K/uL (ref 150.0–400.0)
RBC: 5.39 Mil/uL (ref 4.22–5.81)
RDW: 13 % (ref 11.5–15.5)
WBC: 8.2 K/uL (ref 4.0–10.5)

## 2024-08-28 LAB — LIPASE: Lipase: 39 U/L (ref 11.0–59.0)

## 2024-08-28 LAB — COMPREHENSIVE METABOLIC PANEL WITH GFR
ALT: 39 U/L (ref 3–53)
AST: 24 U/L (ref 5–37)
Albumin: 4.5 g/dL (ref 3.5–5.2)
Alkaline Phosphatase: 61 U/L (ref 39–117)
BUN: 22 mg/dL (ref 6–23)
CO2: 29 meq/L (ref 19–32)
Calcium: 9.1 mg/dL (ref 8.4–10.5)
Chloride: 102 meq/L (ref 96–112)
Creatinine, Ser: 1.12 mg/dL (ref 0.40–1.50)
GFR: 73.24 mL/min
Glucose, Bld: 87 mg/dL (ref 70–99)
Potassium: 4.1 meq/L (ref 3.5–5.1)
Sodium: 138 meq/L (ref 135–145)
Total Bilirubin: 0.9 mg/dL (ref 0.2–1.2)
Total Protein: 7 g/dL (ref 6.0–8.3)

## 2024-08-28 LAB — C-REACTIVE PROTEIN: CRP: 1.5 mg/dL (ref 1.0–20.0)

## 2024-08-28 NOTE — Patient Instructions (Signed)
 I have included information about both pancreatitis and diverticulitis for you to read.  We will obtain labs.  Order for CT was placed.  They will contact you about setting up this appointment.  For continued or worsening symptoms proceed to nearest ED.  Follow-up with PCP.

## 2024-08-28 NOTE — Telephone Encounter (Signed)
 Appt today

## 2024-08-28 NOTE — Progress Notes (Signed)
 "  Established Patient Office Visit   Subjective  Patient ID: Ethan Osborn, male    DOB: 09-20-1965  Age: 59 y.o. MRN: [id number]  Chief Complaint  Patient presents with   Acute Visit    Abdominal pain started a week ago, this episode started 1 day ago, pain nausea diarrhea,   Pt accompanied by his wife.  Pt is a 58 year old male followed by Worth Kitty, MD who was seen for acute concern.  Patient endorses symptoms initially starting last week then resolving but returning 1 day ago with epigastric abdominal pain, nausea, vomiting, diarrhea.  Pain does not radiate.  Patient endorses stomach gurgling and lots of gas.  Denies fever, chills, blood in stools, prior constipation.  On Mounjaro  7.5 mg weekly last dose was last Wednesday.  Tried Mylanta and Imodium for symptoms.    Patient Active Problem List   Diagnosis Date Noted   Lower urinary tract symptoms (LUTS) 05/04/2024   Dyslipidemia associated with type 2 diabetes mellitus (HCC) 10/30/2019   Type 2 diabetes mellitus with hyperglycemia (HCC) 10/30/2019   MDD (major depressive disorder), recurrent episode, severe (HCC) 10/14/2019   Sleep apnea    Arthritis    Allergy    Seborrheic keratoses 09/18/2018   Low testosterone  08/04/2018   Low vitamin D  level 08/04/2018   Actinic keratosis 07/01/2018   Shoulder dislocation, right, initial encounter 03/10/2018   Rash 07/18/2017   GAD (generalized anxiety disorder) 01/08/2017   Past Medical History:  Diagnosis Date   Allergy    Anxiety    Arthritis    right big toe    Hyperlipidemia    Sleep apnea    wears cpap    Past Surgical History:  Procedure Laterality Date   OPEN REDUCTION SHOULDER DISLOCATION     WISDOM TOOTH EXTRACTION     Social History[1] Family History  Problem Relation Age of Onset   Depression Mother    Hearing loss Mother    Colon polyps Mother    Arthritis Father    Diabetes Father    Hearing loss Father    Heart disease Father    Hyperlipidemia  Father    Kidney disease Father    Colon polyps Father    Depression Sister    Stroke Maternal Grandfather    Cancer Paternal Grandmother    Cancer Paternal Grandfather    Colon cancer Neg Hx    Esophageal cancer Neg Hx    Stomach cancer Neg Hx    Rectal cancer Neg Hx    Allergies[2]  ROS Negative unless stated above    Objective:     BP 116/78 (BP Location: Left Arm, Patient Position: Sitting, Cuff Size: Large)   Pulse 60   Temp 97.6 F (36.4 C) (Oral)   Ht 5' 11 (1.803 m)   Wt 194 lb 3.2 oz (88.1 kg)   SpO2 98%   BMI 27.09 kg/m  BP Readings from Last 3 Encounters:  08/28/24 116/78  07/28/24 (!) 90/51  05/07/24 120/69   Wt Readings from Last 3 Encounters:  08/28/24 194 lb 3.2 oz (88.1 kg)  07/28/24 185 lb (83.9 kg)  07/14/24 185 lb (83.9 kg)      Physical Exam Constitutional:      General: He is not in acute distress.    Appearance: Normal appearance.  HENT:     Head: Normocephalic and atraumatic.     Nose: Nose normal.     Mouth/Throat:     Mouth: Mucous membranes are moist.  Cardiovascular:     Rate and Rhythm: Normal rate and regular rhythm.     Heart sounds: Normal heart sounds. No murmur heard.    No gallop.  Pulmonary:     Effort: Pulmonary effort is normal. No respiratory distress.     Breath sounds: Normal breath sounds. No wheezing, rhonchi or rales.  Abdominal:     General: Bowel sounds are normal. There is no distension.     Palpations: Abdomen is soft.     Tenderness: There is abdominal tenderness in the right lower quadrant and epigastric area. There is no guarding or rebound.  Skin:    General: Skin is warm and dry.  Neurological:     Mental Status: He is alert and oriented to person, place, and time.        05/04/2024    7:28 AM 10/23/2023    8:04 AM 04/29/2023    2:01 PM  Depression screen PHQ 2/9  Decreased Interest 0 0 0  Down, Depressed, Hopeless 0 0 0  PHQ - 2 Score 0 0 0  Altered sleeping 0    Tired, decreased energy 0     Change in appetite 0    Feeling bad or failure about yourself  0    Trouble concentrating 0    Moving slowly or fidgety/restless 0    Suicidal thoughts 0    PHQ-9 Score 0     Difficult doing work/chores Not difficult at all       Data saved with a previous flowsheet row definition      03/25/2018    4:47 PM 01/31/2018    8:27 AM 01/03/2018   11:07 AM 10/07/2017    4:42 PM  GAD 7 : Generalized Anxiety Score  Nervous, Anxious, on Edge 0 1 2 1   Control/stop worrying 1 1 3 1   Worry too much - different things 1 0 3 1  Trouble relaxing 0 1 2 1   Restless 0 0 2 0  Easily annoyed or irritable 1 2 3 1   Afraid - awful might happen 0 0 2 0  Total GAD 7 Score 3 5 17 5   Anxiety Difficulty Somewhat difficult Somewhat difficult Very difficult Somewhat difficult     No results found for any visits on 08/28/24.    Assessment & Plan:   Epigastric abdominal pain -     Comprehensive metabolic panel with GFR; Future -     CBC with Differential/Platelet; Future -     Lipase; Future -     C-reactive protein; Future -     CT ABDOMEN PELVIS WO CONTRAST; Future  Diarrhea, unspecified type -     Comprehensive metabolic panel with GFR; Future -     CBC with Differential/Platelet; Future -     Lipase; Future -     C-reactive protein; Future -     CT ABDOMEN PELVIS WO CONTRAST; Future  History of diverticulosis -     CT ABDOMEN PELVIS WO CONTRAST; Future  Acute epigastric and RLQ abdominal pain.  Symptoms concerning for pancreatitis as on Mounjaro  7.5 mg weekly versus diverticulitis-diverticula noted throughout colon on colonoscopy 07/28/2024.SABRA  Also consider viral etiology.  C. difficile less likely as no recent ABX use.  Obtain imaging and labs.  Patient advised to stop Mounjaro .  Further recommendations based on lab results including starting ABX.  Bland diet advance as tolerated.  For worsening symptoms proceed to nearest ED.  Follow-up with PCP  Return if symptoms worsen or fail  to  improve.   Clotilda JONELLE Single, MD    [1]  Social History Tobacco Use   Smoking status: Never   Smokeless tobacco: Never  Vaping Use   Vaping status: Never Used  Substance Use Topics   Alcohol use: Yes    Comment: social    Drug use: Not Currently    Types: Marijuana    Comment: twice monthly  [2]  Allergies Allergen Reactions   Penicillins Hives    Shortness of breath   "

## 2024-08-28 NOTE — Telephone Encounter (Signed)
 FYI Only or Action Required?: FYI only for provider: appointment scheduled on This morning.  Patient was last seen in primary care on 05/07/2024 by Kennyth Worth HERO, MD.  Called Nurse Triage reporting Abdominal Pain. Pain comes in waves. Nausea and diarrhea.Moderate pain.   Symptoms began today.  Interventions attempted: Nothing. - not eating for 36 hours helped when pt had this a week ago helped to resolve pain  Symptoms are: unchanged.  Triage Disposition: See HCP Within 4 Hours (Or PCP Triage)  Patient/caregiver understands and will follow disposition?: Yes                        Summary: Abdominal/Stomach Pain   Reason for Triage: Pt calling reports he is having worsening stomach/abdominal pain, with nausea and vomiting.         Reason for Disposition  [1] MILD-MODERATE pain AND [2] constant AND [3] present > 2 hours  Answer Assessment - Initial Assessment Questions 1. LOCATION: Where does it hurt?      Center left 2. RADIATION: Does the pain shoot anywhere else? (e.g., chest, back)     no 3. ONSET: When did the pain begin? (Minutes, hours or days ago)      Yesterday at 4pm 4. SUDDEN: Gradual or sudden onset?     sudden 5. PATTERN Does the pain come and go, or is it constant?     Comes and goes 6. SEVERITY: How bad is the pain?  (e.g., Scale 1-10; mild, moderate, or severe)     Severe 7. RECURRENT SYMPTOM: Have you ever had this type of stomach pain before? If Yes, ask: When was the last time? and What happened that time?      yes 8. CAUSE: What do you think is causing the stomach pain? (e.g., gallstones, recent abdominal surgery)     Diverticulitis 9. RELIEVING/AGGRAVATING FACTORS: What makes it better or worse? (e.g., antacids, bending or twisting motion, bowel movement)     Not eating. 10. OTHER SYMPTOMS: Do you have any other symptoms? (e.g., back pain, diarrhea, fever, urination pain, vomiting)       Vomiting,  Nausea  Protocols used: Abdominal Pain - Male-A-AH

## 2024-08-31 ENCOUNTER — Ambulatory Visit (HOSPITAL_BASED_OUTPATIENT_CLINIC_OR_DEPARTMENT_OTHER)
Admission: RE | Admit: 2024-08-31 | Discharge: 2024-08-31 | Disposition: A | Discharge status: Home / Self Care | Source: Ambulatory Visit | Attending: Family Medicine | Admitting: Family Medicine

## 2024-08-31 DIAGNOSIS — R197 Diarrhea, unspecified: Secondary | ICD-10-CM | POA: Insufficient documentation

## 2024-08-31 DIAGNOSIS — Z8719 Personal history of other diseases of the digestive system: Secondary | ICD-10-CM | POA: Insufficient documentation

## 2024-08-31 DIAGNOSIS — R1085 Abdominal pain of multiple sites: Secondary | ICD-10-CM | POA: Diagnosis present

## 2024-09-03 ENCOUNTER — Telehealth: Admitting: Family Medicine

## 2024-09-03 ENCOUNTER — Encounter: Payer: Self-pay | Admitting: Family Medicine

## 2024-09-03 VITALS — Ht 71.0 in | Wt 195.0 lb

## 2024-09-03 DIAGNOSIS — R1085 Abdominal pain of multiple sites: Secondary | ICD-10-CM

## 2024-09-03 DIAGNOSIS — E1165 Type 2 diabetes mellitus with hyperglycemia: Secondary | ICD-10-CM

## 2024-09-03 MED ORDER — TIRZEPATIDE 5 MG/0.5ML ~~LOC~~ SOAJ: 5.0000 mg | SUBCUTANEOUS | 0 refills | Status: AC

## 2024-09-03 NOTE — Assessment & Plan Note (Signed)
 We reviewed recent CT scan which did show large stool burden.  He believes this is probably due to the Imodium though also possible that his Mounjaro  could be contributing as well.  Most recent A1c was very well-controlled at 4.9.  We discussed adjusting dosing we will go to 5 mg weekly.  He will follow-up with us  in a few weeks to let us  know how he is doing with this.  Recheck A1c with next set of labs.

## 2024-09-03 NOTE — Progress Notes (Signed)
" ° °  Jd Mccaster is a 59 y.o. male who presents today for a virtual office visit.  Assessment/Plan:  New/Acute Problems: Abdominal pain Reviewed recent CT scan which was notable for large stool burden with diverticulosis but no diverticulitis.  He did have an episode of nausea, vomiting, and diarrhea last week that has since resolved.  He now feels like he is back to baseline.  Discussed with patient that he may have had a viral GI illness also possible that he may have had a mild diverticulitis flare as well given that his symptoms have resolved do not think we need to pursue any further workup at this point though he will let us  know if he has any issues or recurrences going forward.  Chronic Problems Addressed Today: No problem-specific Assessment & Plan notes found for this encounter.     Subjective:  HPI:  See Assessment / plan for status of chronic conditions.   Discussed the use of AI scribe software for clinical note transcription with the patient, who gave verbal consent to proceed.  History of Present Illness Damoney Julia is a 59 year old male who presents with recurrent episodes of diarrhea, vomiting, and abdominal pain.  Approximately two weeks ago, he experienced a gastrointestinal illness characterized by diarrhea, vomiting, and abdominal pain. Initially, he believed it to be a stomach bug as symptoms resolved after a couple of days. However, he experienced a recurrence of the same symptoms about a week later, prompting him to seek medical attention.  During the second episode, a CT scan was performed. The patient read in the MyChart report that it did not sound like diverticulitis, and he was told it showed diverticulosis. At the time of the visit, his symptoms had resolved, and he was feeling fine.  He attempted to manage his symptoms with Imodium, which provided some relief after a delay. He also experienced mild fevers and chills during the episodes. He is currently on  Mounjaro , which he did not take this week. He has not experienced any issues with constipation or difficulty passing stool prior to these episodes. His diet is well-managed, and his A1c levels were excellent at the last check.  No current symptoms of nausea or constipation. Mild fevers and chills were present during the episodes.         Objective/Observations  Physical Exam: Gen: NAD, resting comfortably Pulm: Normal work of breathing Neuro: Grossly normal, moves all extremities Psych: Normal affect and thought content  Virtual Visit via Video   I connected with [name] on 09/03/24 at  9:20 AM EST by a video enabled telemedicine application and verified that I am speaking with the correct person using two identifiers. The limitations of evaluation and management by telemedicine and the availability of in person appointments were discussed. The patient expressed understanding and agreed to proceed.   Patient location: Home Provider location: [place] [employer] Persons participating in the virtual visit: Myself and Patient     Worth HERO. Kennyth, MD 09/03/2024 9:49 AM  "

## 2024-09-18 NOTE — Progress Notes (Unsigned)
 SABRA

## 2024-09-21 ENCOUNTER — Telehealth: Payer: 59 | Admitting: Family Medicine

## 2024-09-21 DIAGNOSIS — G4733 Obstructive sleep apnea (adult) (pediatric): Secondary | ICD-10-CM

## 2024-10-23 ENCOUNTER — Encounter: Admitting: Family Medicine

## 2024-10-29 ENCOUNTER — Encounter: Admitting: Family Medicine
